# Patient Record
Sex: Female | Born: 1940 | Race: White | Hispanic: No | Smoking: Former smoker
Health system: Southern US, Community
[De-identification: ages and names within clinical notes are randomized; demographics above are authoritative.]

## PROBLEM LIST (undated history)

## (undated) DIAGNOSIS — R350 Frequency of micturition: Secondary | ICD-10-CM

## (undated) DIAGNOSIS — Z9981 Dependence on supplemental oxygen: Secondary | ICD-10-CM

## (undated) DIAGNOSIS — Z9289 Personal history of other medical treatment: Secondary | ICD-10-CM

## (undated) DIAGNOSIS — M2540 Effusion, unspecified joint: Secondary | ICD-10-CM

## (undated) DIAGNOSIS — M62838 Other muscle spasm: Secondary | ICD-10-CM

## (undated) DIAGNOSIS — J439 Emphysema, unspecified: Secondary | ICD-10-CM

## (undated) DIAGNOSIS — I10 Essential (primary) hypertension: Secondary | ICD-10-CM

## (undated) DIAGNOSIS — M549 Dorsalgia, unspecified: Secondary | ICD-10-CM

## (undated) DIAGNOSIS — Z8619 Personal history of other infectious and parasitic diseases: Secondary | ICD-10-CM

## (undated) DIAGNOSIS — Z8669 Personal history of other diseases of the nervous system and sense organs: Secondary | ICD-10-CM

## (undated) DIAGNOSIS — F32A Depression, unspecified: Secondary | ICD-10-CM

## (undated) DIAGNOSIS — N390 Urinary tract infection, site not specified: Secondary | ICD-10-CM

## (undated) DIAGNOSIS — K219 Gastro-esophageal reflux disease without esophagitis: Secondary | ICD-10-CM

## (undated) DIAGNOSIS — G629 Polyneuropathy, unspecified: Secondary | ICD-10-CM

## (undated) DIAGNOSIS — I499 Cardiac arrhythmia, unspecified: Secondary | ICD-10-CM

## (undated) DIAGNOSIS — M1990 Unspecified osteoarthritis, unspecified site: Secondary | ICD-10-CM

## (undated) DIAGNOSIS — M300 Polyarteritis nodosa: Secondary | ICD-10-CM

## (undated) DIAGNOSIS — R51 Headache: Secondary | ICD-10-CM

## (undated) DIAGNOSIS — A6920 Lyme disease, unspecified: Secondary | ICD-10-CM

## (undated) DIAGNOSIS — C541 Malignant neoplasm of endometrium: Secondary | ICD-10-CM

## (undated) DIAGNOSIS — D649 Anemia, unspecified: Secondary | ICD-10-CM

## (undated) DIAGNOSIS — M2550 Pain in unspecified joint: Secondary | ICD-10-CM

## (undated) DIAGNOSIS — R0602 Shortness of breath: Secondary | ICD-10-CM

## (undated) DIAGNOSIS — R413 Other amnesia: Secondary | ICD-10-CM

## (undated) DIAGNOSIS — R3915 Urgency of urination: Secondary | ICD-10-CM

## (undated) DIAGNOSIS — IMO0002 Reserved for concepts with insufficient information to code with codable children: Secondary | ICD-10-CM

## (undated) DIAGNOSIS — Z860100 Personal history of colon polyps, unspecified: Secondary | ICD-10-CM

## (undated) DIAGNOSIS — M810 Age-related osteoporosis without current pathological fracture: Secondary | ICD-10-CM

## (undated) DIAGNOSIS — F329 Major depressive disorder, single episode, unspecified: Secondary | ICD-10-CM

## (undated) DIAGNOSIS — F988 Other specified behavioral and emotional disorders with onset usually occurring in childhood and adolescence: Secondary | ICD-10-CM

## (undated) DIAGNOSIS — F429 Obsessive-compulsive disorder, unspecified: Secondary | ICD-10-CM

## (undated) DIAGNOSIS — K5790 Diverticulosis of intestine, part unspecified, without perforation or abscess without bleeding: Secondary | ICD-10-CM

## (undated) HISTORY — DX: Malignant neoplasm of endometrium: C54.1

## (undated) HISTORY — DX: Obsessive-compulsive disorder, unspecified: F42.9

## (undated) HISTORY — DX: Major depressive disorder, single episode, unspecified: F32.9

## (undated) HISTORY — DX: Depression, unspecified: F32.A

## (undated) HISTORY — PX: TOTAL ABDOMINAL HYSTERECTOMY W/ BILATERAL SALPINGOOPHORECTOMY: SHX83

## (undated) HISTORY — DX: Polyarteritis nodosa: M30.0

## (undated) HISTORY — DX: Unspecified osteoarthritis, unspecified site: M19.90

## (undated) HISTORY — PX: ABDOMINAL HYSTERECTOMY: SHX81

## (undated) HISTORY — DX: Other specified behavioral and emotional disorders with onset usually occurring in childhood and adolescence: F98.8

## (undated) HISTORY — PX: COLONOSCOPY: SHX174

## (undated) HISTORY — PX: CHOLECYSTECTOMY: SHX55

## (undated) HISTORY — PX: WRIST SURGERY: SHX841

## (undated) HISTORY — DX: Essential (primary) hypertension: I10

## (undated) HISTORY — DX: Diverticulosis of intestine, part unspecified, without perforation or abscess without bleeding: K57.90

## (undated) HISTORY — DX: Age-related osteoporosis without current pathological fracture: M81.0

## (undated) HISTORY — PX: OTHER SURGICAL HISTORY: SHX169

## (undated) HISTORY — DX: Lyme disease, unspecified: A69.20

## (undated) HISTORY — DX: Reserved for concepts with insufficient information to code with codable children: IMO0002

---

## 2004-11-08 ENCOUNTER — Ambulatory Visit: Payer: Self-pay | Admitting: Gastroenterology

## 2004-11-17 ENCOUNTER — Ambulatory Visit: Payer: Self-pay | Admitting: Gastroenterology

## 2009-09-03 HISTORY — PX: OTHER SURGICAL HISTORY: SHX169

## 2009-11-10 ENCOUNTER — Ambulatory Visit
Admission: RE | Admit: 2009-11-10 | Discharge: 2009-11-10 | Payer: Self-pay | Source: Home / Self Care | Admitting: Gynecologic Oncology

## 2010-02-02 ENCOUNTER — Ambulatory Visit: Payer: Self-pay | Admitting: Oncology

## 2010-02-06 LAB — CBC WITH DIFFERENTIAL/PLATELET
BASO%: 1.1 % (ref 0.0–2.0)
Eosinophils Absolute: 0.1 10*3/uL (ref 0.0–0.5)
MCHC: 33 g/dL (ref 31.5–36.0)
MONO#: 0.4 10*3/uL (ref 0.1–0.9)
NEUT#: 2.2 10*3/uL (ref 1.5–6.5)
RBC: 4.33 10*6/uL (ref 3.70–5.45)
WBC: 5.3 10*3/uL (ref 3.9–10.3)
lymph#: 2.5 10*3/uL (ref 0.9–3.3)

## 2010-02-06 LAB — COMPREHENSIVE METABOLIC PANEL
ALT: 16 U/L (ref 0–35)
Albumin: 4.3 g/dL (ref 3.5–5.2)
CO2: 24 mEq/L (ref 19–32)
Calcium: 9.3 mg/dL (ref 8.4–10.5)
Chloride: 105 mEq/L (ref 96–112)
Potassium: 4 mEq/L (ref 3.5–5.3)
Sodium: 140 mEq/L (ref 135–145)
Total Bilirubin: 0.4 mg/dL (ref 0.3–1.2)
Total Protein: 6.9 g/dL (ref 6.0–8.3)

## 2010-09-03 DIAGNOSIS — C541 Malignant neoplasm of endometrium: Secondary | ICD-10-CM

## 2010-09-03 HISTORY — DX: Malignant neoplasm of endometrium: C54.1

## 2011-03-02 ENCOUNTER — Encounter: Payer: Self-pay | Admitting: Gastroenterology

## 2011-04-30 ENCOUNTER — Encounter: Payer: Self-pay | Admitting: Gastroenterology

## 2011-04-30 ENCOUNTER — Ambulatory Visit: Payer: Self-pay | Admitting: Gastroenterology

## 2011-09-04 DIAGNOSIS — A6920 Lyme disease, unspecified: Secondary | ICD-10-CM

## 2011-09-04 DIAGNOSIS — Z8619 Personal history of other infectious and parasitic diseases: Secondary | ICD-10-CM

## 2011-09-04 HISTORY — DX: Lyme disease, unspecified: A69.20

## 2011-09-04 HISTORY — DX: Personal history of other infectious and parasitic diseases: Z86.19

## 2011-10-01 ENCOUNTER — Encounter: Payer: Self-pay | Admitting: Gastroenterology

## 2013-07-02 ENCOUNTER — Encounter: Payer: Self-pay | Admitting: Gastroenterology

## 2013-07-15 ENCOUNTER — Ambulatory Visit (INDEPENDENT_AMBULATORY_CARE_PROVIDER_SITE_OTHER): Payer: Medicare Other | Admitting: Gastroenterology

## 2013-07-15 ENCOUNTER — Encounter: Payer: Self-pay | Admitting: Gastroenterology

## 2013-07-15 VITALS — BP 110/50 | HR 92 | Ht 63.0 in | Wt 146.1 lb

## 2013-07-15 DIAGNOSIS — C549 Malignant neoplasm of corpus uteri, unspecified: Secondary | ICD-10-CM

## 2013-07-15 DIAGNOSIS — D509 Iron deficiency anemia, unspecified: Secondary | ICD-10-CM

## 2013-07-15 DIAGNOSIS — C541 Malignant neoplasm of endometrium: Secondary | ICD-10-CM | POA: Insufficient documentation

## 2013-07-15 DIAGNOSIS — D649 Anemia, unspecified: Secondary | ICD-10-CM

## 2013-07-15 HISTORY — DX: Iron deficiency anemia, unspecified: D50.9

## 2013-07-15 NOTE — Patient Instructions (Signed)
You have been scheduled for a colonoscopy with propofol. Please follow written instructions given to you at your visit today.  Please pick up your prep kit at the pharmacy within the next 1-3 days. If you use inhalers (even only as needed), please bring them with you on the day of your procedure. Your physician has requested that you go to www.startemmi.com and enter the access code given to you at your visit today. This web site gives a general overview about your procedure. However, you should still follow specific instructions given to you by our office regarding your preparation for the procedure. Go to the basement for your IFOB Kit You will need to contact [facility]

## 2013-07-15 NOTE — Assessment & Plan Note (Signed)
Iron deficiency anemia is most likely due to chronic GI blood loss.  Patient had been taking NSAIDs and steroids for several weeks which may have caused ulcerations or erosions anywhere along the GI tract.  Bleeding from polyps, AVMs, neoplasm and ulcer disease are considerations.  Recommendations #1 colonoscopy #2 Hemoccults #3 upper endoscopy if colonoscopy is not diagnostic for a GI bleeding source  Risks, alternatives, and complications of the procedure, including bleeding, perforation, and possible need for surgery, were explained to the patient.  Patient's questions were answered.

## 2013-07-15 NOTE — Progress Notes (Signed)
History of Present Illness: 72 year old white female with history of stage IIIc endometrial CA, referred for evaluation of an iron deficiency anemia.  This was noted on routine testing.  Recent hemoglobin was 8.6 with MCV 77.  Serum iron was 21 and percent saturation 5%.  Patient has no GI complaints including change of bowel habits, abdominal pain, melena or hematochezia.  She had been taking Celebrex and dexamethasone for months because of arthritic hip pain.  She stopped these medicines several weeks ago.   2006 colonoscopy demonstrated diverticulosis.  She denies pyrosis, dysphagia,  For her endometrial CA, diagnosed in May, 2011, she received chemotherapy following a total abdominal hysterectomy and bilateral salpingo-oophorectomy.    Past Medical History  Diagnosis Date  . Emphysema   . DDD (degenerative disc disease)   . Unspecified essential hypertension   . Rib fracture     from coughing  . Chronic bronchitis   . ADD (attention deficit disorder)   . Depression   . OCD (obsessive compulsive disorder)   . Endometrial adenocarcinoma 2012  . Lyme disease 2013  . Polyarteritis nodosa   . DJD (degenerative joint disease)   . Osteoporosis    Past Surgical History  Procedure Laterality Date  . Cholecystectomy    . Tubal ligation    . Total abdominal hysterectomy w/ bilateral salpingoophorectomy     family history includes Cervical cancer in her maternal aunt; Diabetes in her father and maternal grandmother; Emphysema in her mother; Heart disease in her maternal grandmother and mother; Lung disease in her brother. Current Outpatient Prescriptions  Medication Sig Dispense Refill  . albuterol (PROVENTIL HFA;VENTOLIN HFA) 108 (90 BASE) MCG/ACT inhaler Inhale 2 puffs into the lungs as needed for wheezing or shortness of breath.      . celecoxib (CELEBREX) 200 MG capsule Take 200 mg by mouth 2 (two) times daily.      . cloNIDine (CATAPRES) 0.1 MG tablet Take 0.1 mg by mouth daily.       . cyclobenzaprine (FLEXERIL) 10 MG tablet Take 10 mg by mouth at bedtime as needed for muscle spasms.      Marland Kitchen FLUoxetine (PROZAC) 40 MG capsule Take 40 mg by mouth 2 (two) times daily.      . fluticasone-salmeterol (ADVAIR HFA) 115-21 MCG/ACT inhaler Inhale 2 puffs into the lungs 2 (two) times daily.        . methylphenidate (RITALIN) 10 MG tablet Take 10 mg by mouth 2 (two) times daily.      . metoprolol tartrate (LOPRESSOR) 25 MG tablet Take 25 mg by mouth 1 day or 1 dose.        Marland Kitchen omeprazole (PRILOSEC) 20 MG capsule Take 20 mg by mouth daily.      Marland Kitchen oxycodone-acetaminophen (PERCOCET) 2.5-325 MG per tablet Take 1-2 tablets by mouth daily.      . ramipril (ALTACE) 5 MG capsule Take 5 mg by mouth daily.        Marland Kitchen VITAMIN D, CHOLECALCIFEROL, PO Take 1.25 mg by mouth once a week.        No current facility-administered medications for this visit.   Allergies as of 07/15/2013  . (No Known Allergies)    reports that she quit smoking about 7 years ago. Her smoking use included Cigarettes. She smoked 0.00 packs per day. She has never used smokeless tobacco. She reports that she does not drink alcohol or use illicit drugs.     Review of Systems: She has severe hip pain rendering her  unable to walk Pertinent positive and negative review of systems were noted in the above HPI section. All other review of systems were otherwise negative.  Vital signs were reviewed in today's medical record Physical Exam: General: Well developed , well nourished, no acute distress Skin: anicteric Head: Normocephalic and atraumatic Eyes:  sclerae anicteric, EOMI Ears: Normal auditory acuity Mouth: No deformity or lesions Neck: Supple, no masses or thyromegaly Lungs: Clear throughout to auscultation Heart: Regular rate and rhythm; no murmurs, rubs or bruits Abdomen: Soft, non tender and non distended. No masses, hepatosplenomegaly or hernias noted. Normal Bowel sounds Rectal:deferred Musculoskeletal:  Symmetrical with no gross deformities  Skin: No lesions on visible extremities Pulses:  Normal pulses noted Extremities: No clubbing, cyanosis, edema or deformities noted Neurological: Alert oriented x 4, grossly nonfocal Cervical Nodes:  No significant cervical adenopathy Inguinal Nodes: No significant inguinal adenopathy Psychological:  Alert and cooperative. Normal mood and affect

## 2013-07-28 ENCOUNTER — Other Ambulatory Visit (HOSPITAL_COMMUNITY): Payer: Self-pay | Admitting: Orthopaedic Surgery

## 2013-08-13 ENCOUNTER — Encounter (HOSPITAL_COMMUNITY)
Admission: RE | Admit: 2013-08-13 | Discharge: 2013-08-13 | Disposition: A | Discharge status: Home / Self Care | Payer: Medicare Other | Source: Ambulatory Visit | Attending: Orthopaedic Surgery | Admitting: Orthopaedic Surgery

## 2013-08-13 ENCOUNTER — Encounter (HOSPITAL_COMMUNITY): Payer: Self-pay

## 2013-08-13 DIAGNOSIS — Z01812 Encounter for preprocedural laboratory examination: Secondary | ICD-10-CM | POA: Insufficient documentation

## 2013-08-13 DIAGNOSIS — Z01818 Encounter for other preprocedural examination: Secondary | ICD-10-CM | POA: Insufficient documentation

## 2013-08-13 HISTORY — DX: Personal history of colonic polyps: Z86.010

## 2013-08-13 HISTORY — DX: Urinary tract infection, site not specified: N39.0

## 2013-08-13 HISTORY — DX: Effusion, unspecified joint: M25.40

## 2013-08-13 HISTORY — DX: Emphysema, unspecified: J43.9

## 2013-08-13 HISTORY — DX: Anemia, unspecified: D64.9

## 2013-08-13 HISTORY — DX: Polyneuropathy, unspecified: G62.9

## 2013-08-13 HISTORY — DX: Cardiac arrhythmia, unspecified: I49.9

## 2013-08-13 HISTORY — DX: Gastro-esophageal reflux disease without esophagitis: K21.9

## 2013-08-13 HISTORY — DX: Frequency of micturition: R35.0

## 2013-08-13 HISTORY — DX: Personal history of other infectious and parasitic diseases: Z86.19

## 2013-08-13 HISTORY — DX: Shortness of breath: R06.02

## 2013-08-13 HISTORY — DX: Dorsalgia, unspecified: M54.9

## 2013-08-13 HISTORY — DX: Urgency of urination: R39.15

## 2013-08-13 HISTORY — DX: Personal history of other diseases of the nervous system and sense organs: Z86.69

## 2013-08-13 HISTORY — DX: Other amnesia: R41.3

## 2013-08-13 HISTORY — DX: Pain in unspecified joint: M25.50

## 2013-08-13 HISTORY — DX: Personal history of colon polyps, unspecified: Z86.0100

## 2013-08-13 HISTORY — DX: Other muscle spasm: M62.838

## 2013-08-13 LAB — BASIC METABOLIC PANEL
BUN: 22 mg/dL (ref 6–23)
CO2: 23 mEq/L (ref 19–32)
Calcium: 9.5 mg/dL (ref 8.4–10.5)
Creatinine, Ser: 0.7 mg/dL (ref 0.50–1.10)
GFR calc non Af Amer: 85 mL/min — ABNORMAL LOW (ref >90)
Glucose, Bld: 95 mg/dL (ref 70–99)

## 2013-08-13 LAB — ABO/RH: ABO/RH(D): A POS

## 2013-08-13 LAB — CBC
Hemoglobin: 10.7 g/dL — ABNORMAL LOW (ref 12.0–15.0)
MCH: 24.1 pg — ABNORMAL LOW (ref 26.0–34.0)
MCHC: 31.3 g/dL (ref 30.0–36.0)
MCV: 77 fL — ABNORMAL LOW (ref 78.0–100.0)
Platelets: 312 10*3/uL (ref 150–400)

## 2013-08-13 LAB — PROTIME-INR: Prothrombin Time: 13 seconds (ref 11.6–15.2)

## 2013-08-13 NOTE — Progress Notes (Signed)
Cardiologist is with [facility] systems with last visit about 4-29months ago;just has to go back as needed was seen for clearance  Denies ever having a stress test/heart cath  Echo done around Spring 2013  Medical Md is Dr.Phillip Land in [place]  EKG to be requested from [employer]

## 2013-08-13 NOTE — Progress Notes (Signed)
Pt left prior to giving urine sample to lab technician.Now will have to obtain ua on arrival

## 2013-08-13 NOTE — Pre-Procedure Instructions (Signed)
Jerika Wales  08/13/2013   Your procedure is scheduled on:  Tues, Dec 16 @ 12:45 PM  Report to [facility]trance A at 10:45 AM.  Call this number if you have problems the morning of surgery: [phone]   Remember:   Do not eat food or drink liquids after midnight.   Take these medicines the morning of surgery with A SIP OF WATER: Albuterol<Bring Your Inhaler With You>,Clonidine(Catapres),Prozac(Fluoxetine),Advair(Fluticasone),Ritalin(Methylphenidate),Metoprolol(Lopressor),Omeprazole(Prilosec),and Pain Pill(if needed)               No Goody's,BC's,Aleve,Aspirin,Ibuprofen,Fish Oil,or any Herbal Medications   Do not wear jewelry, make-up or nail polish.  Do not wear lotions, powders, or perfumes. You may wear deodorant.  Do not shave 48 hours prior to surgery.   Do not bring valuables to the hospital.  [facility] is not responsible                  for any belongings or valuables.               Contacts, dentures or bridgework may not be worn into surgery.  Leave suitcase in the car. After surgery it may be brought to your room.  For patients admitted to the hospital, discharge time is determined by your                treatment team.                 Special Instructions: Shower using CHG 2 nights before surgery and the night before surgery.  If you shower the day of surgery use CHG.  Use special wash - you have one bottle of CHG for all showers.  You should use approximately 1/3 of the bottle for each shower.   Please read over the following fact sheets that you were given: Pain Booklet, Coughing and Deep Breathing, Blood Transfusion Information, MRSA Information and Surgical Site Infection Prevention

## 2013-08-14 NOTE — Progress Notes (Signed)
Anesthesia chart review:  Patient is a 72 year old female scheduled for left THA on 08/18/13 by Dr. Doneen Poisson.    History includes former smoker, Lyme disease '13, polyarteritis nodosa, endometrial adenocarcinoma s/p hysterectomy/BSO, OCD, ADD, GERD, migraines, emphysema, chronic bronchitis, HTN, neuropathy, short-term memory loss, anemia, cholecystectomy, right BBB, tachycardia (atiral tachycardia per cardiology notes). PCP is Dr. Treasa School in [place]. She was seen by cardiologist Dr. Norman Herrlich of [facility]) in [place] in April 2014 for preoperative evaluation for a right hand surgery.  It appears that ultimately, PRN cardiology follow-up was recommended. She is on b-blocker therapy.  Echo on 04/16/12 [facility]) showed normal LV size and global and regional systolic function, EF 60%. Abnormal diastolic function, impaired relaxation with a normal LV EDP. Mild aortic valve sclerosis, mild AR.  No AS.  Trivial MR. Moderate TR. RVSP 40 mm Hg.  By notes, EKG on 12/16/12 showed NSR, with right BBB. ([facility] reports that EKG was not done at their office, so they can not send it. Fax request send to Dr. Thomos Lemons office.  Short Stay staff to follow-up.  If not received she will need an EKG on arrival.) HR was 86 at PAT.  CT of the Chest/ABD/PELVIS with contrast on 06/29/13 [facility]) showed two 2-3 mm subpleural nodules in the left lower lobe unchanged from 06/22/2010, benign. Calcified granuloma in the right lower lobe. Prior left upper lobe pulmonary nodule has resolved. Underlying moderate centrilobular emphysematous changes. Mild dependent atelectasis of the right lower lobe. No pleural effusion or pneumothorax. Visualized thyroid is unremarkable. The heart is normal size. No pericardial effusion. Coronary atherosclerosis. Atherosclerotic calcification of aortic arch and abdominal aorta and branch vessels. No suspicious mediastinal, hilar, or axillary lymphadenopathy.  Dense glanular tissue with scattered calcification in the bilateral breasts. Moderate hiatal hernia. Mild degenerative changes of the thoracic spine. Liver, spleen, pancreas, and adrenal glands are within normal limits. Status post cholecystectomy and hysterectomy. No evidence of recurrent or metastatic disease in the chest, abdomen, or pelvis.  (Since she had a recent chest CT, I do not think that she will require a preoperative chest CXR.)  Preoperative labs noted.  Patient left her PAT visit without providing a urine specimen, so this will need to be done on the day of surgery.  If no acute changes then I would anticipate that she could proceed as planned.  Velna Ochs [facility] Stay [facility]/Anesthesiology Phone [phone] 08/14/2013 12:00 PM

## 2013-08-17 MED ORDER — CEFAZOLIN SODIUM-DEXTROSE 2-3 GM-% IV SOLR
2.0000 g | INTRAVENOUS | Status: AC
  Administered 2013-08-18: 2 g via INTRAVENOUS
  Filled 2013-08-17: qty 50

## 2013-08-18 ENCOUNTER — Encounter (HOSPITAL_COMMUNITY)
Admission: RE | Disposition: A | Discharge status: Home Health | Payer: Self-pay | Source: Ambulatory Visit | Attending: Orthopaedic Surgery

## 2013-08-18 ENCOUNTER — Inpatient Hospital Stay (HOSPITAL_COMMUNITY)
Admission: RE | Admit: 2013-08-18 | Discharge: 2013-08-21 | DRG: 470 | Disposition: A | Discharge status: Home Health | Payer: Medicare Other | Source: Ambulatory Visit | Attending: Orthopaedic Surgery | Admitting: Orthopaedic Surgery

## 2013-08-18 ENCOUNTER — Encounter (HOSPITAL_COMMUNITY): Payer: Medicare Other | Admitting: Vascular Surgery

## 2013-08-18 ENCOUNTER — Inpatient Hospital Stay (HOSPITAL_COMMUNITY): Payer: Medicare Other | Admitting: Certified Registered Nurse Anesthetist

## 2013-08-18 ENCOUNTER — Inpatient Hospital Stay (HOSPITAL_COMMUNITY): Payer: Medicare Other

## 2013-08-18 ENCOUNTER — Encounter (HOSPITAL_COMMUNITY): Payer: Self-pay | Admitting: *Deleted

## 2013-08-18 DIAGNOSIS — J4489 Other specified chronic obstructive pulmonary disease: Secondary | ICD-10-CM | POA: Diagnosis present

## 2013-08-18 DIAGNOSIS — B370 Candidal stomatitis: Secondary | ICD-10-CM | POA: Diagnosis present

## 2013-08-18 DIAGNOSIS — F429 Obsessive-compulsive disorder, unspecified: Secondary | ICD-10-CM | POA: Diagnosis present

## 2013-08-18 DIAGNOSIS — Z8049 Family history of malignant neoplasm of other genital organs: Secondary | ICD-10-CM

## 2013-08-18 DIAGNOSIS — J449 Chronic obstructive pulmonary disease, unspecified: Secondary | ICD-10-CM | POA: Diagnosis present

## 2013-08-18 DIAGNOSIS — Z8542 Personal history of malignant neoplasm of other parts of uterus: Secondary | ICD-10-CM

## 2013-08-18 DIAGNOSIS — D509 Iron deficiency anemia, unspecified: Secondary | ICD-10-CM | POA: Diagnosis present

## 2013-08-18 DIAGNOSIS — M25459 Effusion, unspecified hip: Secondary | ICD-10-CM | POA: Diagnosis present

## 2013-08-18 DIAGNOSIS — Z87891 Personal history of nicotine dependence: Secondary | ICD-10-CM

## 2013-08-18 DIAGNOSIS — K219 Gastro-esophageal reflux disease without esophagitis: Secondary | ICD-10-CM | POA: Diagnosis present

## 2013-08-18 DIAGNOSIS — G43909 Migraine, unspecified, not intractable, without status migrainosus: Secondary | ICD-10-CM | POA: Diagnosis present

## 2013-08-18 DIAGNOSIS — Z96649 Presence of unspecified artificial hip joint: Secondary | ICD-10-CM

## 2013-08-18 DIAGNOSIS — I4510 Unspecified right bundle-branch block: Secondary | ICD-10-CM | POA: Diagnosis present

## 2013-08-18 DIAGNOSIS — R413 Other amnesia: Secondary | ICD-10-CM | POA: Diagnosis present

## 2013-08-18 DIAGNOSIS — Z8249 Family history of ischemic heart disease and other diseases of the circulatory system: Secondary | ICD-10-CM

## 2013-08-18 DIAGNOSIS — G589 Mononeuropathy, unspecified: Secondary | ICD-10-CM | POA: Diagnosis present

## 2013-08-18 DIAGNOSIS — I10 Essential (primary) hypertension: Secondary | ICD-10-CM | POA: Diagnosis present

## 2013-08-18 DIAGNOSIS — F329 Major depressive disorder, single episode, unspecified: Secondary | ICD-10-CM | POA: Diagnosis present

## 2013-08-18 DIAGNOSIS — D62 Acute posthemorrhagic anemia: Secondary | ICD-10-CM | POA: Diagnosis not present

## 2013-08-18 DIAGNOSIS — M62838 Other muscle spasm: Secondary | ICD-10-CM | POA: Diagnosis present

## 2013-08-18 DIAGNOSIS — M169 Osteoarthritis of hip, unspecified: Secondary | ICD-10-CM

## 2013-08-18 DIAGNOSIS — F3289 Other specified depressive episodes: Secondary | ICD-10-CM | POA: Diagnosis present

## 2013-08-18 DIAGNOSIS — Z8619 Personal history of other infectious and parasitic diseases: Secondary | ICD-10-CM

## 2013-08-18 DIAGNOSIS — F1020 Alcohol dependence, uncomplicated: Secondary | ICD-10-CM | POA: Diagnosis present

## 2013-08-18 DIAGNOSIS — M810 Age-related osteoporosis without current pathological fracture: Secondary | ICD-10-CM | POA: Diagnosis present

## 2013-08-18 DIAGNOSIS — Z833 Family history of diabetes mellitus: Secondary | ICD-10-CM

## 2013-08-18 DIAGNOSIS — M1610 Unilateral primary osteoarthritis, unspecified hip: Principal | ICD-10-CM | POA: Diagnosis present

## 2013-08-18 DIAGNOSIS — F988 Other specified behavioral and emotional disorders with onset usually occurring in childhood and adolescence: Secondary | ICD-10-CM | POA: Diagnosis present

## 2013-08-18 HISTORY — PX: TOTAL HIP ARTHROPLASTY: SHX124

## 2013-08-18 HISTORY — DX: Presence of unspecified artificial hip joint: Z96.649

## 2013-08-18 LAB — URINALYSIS, ROUTINE W REFLEX MICROSCOPIC
Hgb urine dipstick: NEGATIVE
Leukocytes, UA: NEGATIVE
Nitrite: NEGATIVE
Specific Gravity, Urine: 1.021 (ref 1.005–1.030)
pH: 8 (ref 5.0–8.0)

## 2013-08-18 SURGERY — ARTHROPLASTY, HIP, TOTAL, ANTERIOR APPROACH
Anesthesia: General | Site: Hip | Laterality: Left

## 2013-08-18 MED ORDER — ALBUTEROL SULFATE HFA 108 (90 BASE) MCG/ACT IN AERS
2.0000 | INHALATION_SPRAY | RESPIRATORY_TRACT | Status: DC | PRN
  Filled 2013-08-18: qty 6.7

## 2013-08-18 MED ORDER — METOCLOPRAMIDE HCL 10 MG PO TABS: 5.0000 mg | ORAL_TABLET | Freq: Three times a day (TID) | ORAL | Status: DC | PRN

## 2013-08-18 MED ORDER — METHOCARBAMOL 500 MG PO TABS: 500.0000 mg | ORAL_TABLET | Freq: Four times a day (QID) | ORAL | Status: DC | PRN

## 2013-08-18 MED ORDER — ZOLPIDEM TARTRATE 5 MG PO TABS: 5.0000 mg | ORAL_TABLET | Freq: Every evening | ORAL | Status: DC | PRN

## 2013-08-18 MED ORDER — METOCLOPRAMIDE HCL 5 MG/ML IJ SOLN: 5.0000 mg | Freq: Three times a day (TID) | INTRAMUSCULAR | Status: DC | PRN

## 2013-08-18 MED ORDER — LIDOCAINE HCL (CARDIAC) 20 MG/ML IV SOLN
INTRAVENOUS | Status: DC | PRN
  Administered 2013-08-18: 80 mg via INTRAVENOUS
  Administered 2013-08-18: 10 mg via INTRAVENOUS

## 2013-08-18 MED ORDER — ALUM & MAG HYDROXIDE-SIMETH 200-200-20 MG/5ML PO SUSP: 30.0000 mL | ORAL | Status: DC | PRN

## 2013-08-18 MED ORDER — SODIUM CHLORIDE 0.9 % IR SOLN
Status: DC | PRN
  Administered 2013-08-18: 3000 mL

## 2013-08-18 MED ORDER — PHENYLEPHRINE HCL 10 MG/ML IJ SOLN
INTRAMUSCULAR | Status: DC | PRN
  Administered 2013-08-18 (×2): 40 ug via INTRAVENOUS
  Administered 2013-08-18: 80 ug via INTRAVENOUS
  Administered 2013-08-18: 40 ug via INTRAVENOUS
  Administered 2013-08-18 (×4): 80 ug via INTRAVENOUS

## 2013-08-18 MED ORDER — NEOSTIGMINE METHYLSULFATE 1 MG/ML IJ SOLN
INTRAMUSCULAR | Status: DC | PRN
  Administered 2013-08-18: 4 mg via INTRAVENOUS

## 2013-08-18 MED ORDER — MIDAZOLAM HCL 5 MG/5ML IJ SOLN
INTRAMUSCULAR | Status: DC | PRN
  Administered 2013-08-18: 1 mg via INTRAVENOUS

## 2013-08-18 MED ORDER — METOPROLOL TARTRATE 25 MG PO TABS
25.0000 mg | ORAL_TABLET | ORAL | Status: DC
  Administered 2013-08-19 – 2013-08-21 (×3): 25 mg via ORAL
  Filled 2013-08-18 (×3): qty 1

## 2013-08-18 MED ORDER — ACETAMINOPHEN 325 MG PO TABS: 650.0000 mg | ORAL_TABLET | Freq: Four times a day (QID) | ORAL | Status: DC | PRN

## 2013-08-18 MED ORDER — HYDROMORPHONE HCL PF 1 MG/ML IJ SOLN
1.0000 mg | INTRAMUSCULAR | Status: DC | PRN
  Administered 2013-08-18 – 2013-08-21 (×6): 1 mg via INTRAVENOUS
  Filled 2013-08-18 (×6): qty 1

## 2013-08-18 MED ORDER — ONDANSETRON HCL 4 MG/2ML IJ SOLN
INTRAMUSCULAR | Status: DC | PRN
  Administered 2013-08-18: 4 mg via INTRAVENOUS

## 2013-08-18 MED ORDER — TRANEXAMIC ACID 100 MG/ML IV SOLN
1000.0000 mg | INTRAVENOUS | Status: AC
  Administered 2013-08-18: 1000 mg via INTRAVENOUS
  Filled 2013-08-18: qty 10

## 2013-08-18 MED ORDER — FENTANYL CITRATE 0.05 MG/ML IJ SOLN
25.0000 ug | INTRAMUSCULAR | Status: DC | PRN
  Administered 2013-08-18 (×3): 50 ug via INTRAVENOUS

## 2013-08-18 MED ORDER — METHYLPHENIDATE HCL 5 MG PO TABS
10.0000 mg | ORAL_TABLET | Freq: Two times a day (BID) | ORAL | Status: DC
  Administered 2013-08-19 – 2013-08-21 (×3): 10 mg via ORAL
  Filled 2013-08-18 (×4): qty 2

## 2013-08-18 MED ORDER — FLUOXETINE HCL 20 MG PO CAPS
40.0000 mg | ORAL_CAPSULE | Freq: Two times a day (BID) | ORAL | Status: DC
  Administered 2013-08-19 – 2013-08-21 (×4): 40 mg via ORAL
  Filled 2013-08-18 (×7): qty 2

## 2013-08-18 MED ORDER — POLYETHYLENE GLYCOL 3350 17 G PO PACK: 17.0000 g | PACK | Freq: Every day | ORAL | Status: DC | PRN

## 2013-08-18 MED ORDER — DIPHENHYDRAMINE HCL 12.5 MG/5ML PO ELIX: 12.5000 mg | ORAL_SOLUTION | ORAL | Status: DC | PRN

## 2013-08-18 MED ORDER — GABAPENTIN 300 MG PO CAPS
300.0000 mg | ORAL_CAPSULE | Freq: Every day | ORAL | Status: DC
  Administered 2013-08-18 – 2013-08-20 (×3): 300 mg via ORAL
  Filled 2013-08-18 (×4): qty 1

## 2013-08-18 MED ORDER — OXYCODONE HCL ER 20 MG PO T12A
20.0000 mg | EXTENDED_RELEASE_TABLET | Freq: Two times a day (BID) | ORAL | Status: DC
  Administered 2013-08-20 – 2013-08-21 (×3): 20 mg via ORAL
  Filled 2013-08-18 (×5): qty 2

## 2013-08-18 MED ORDER — ALBUTEROL SULFATE HFA 108 (90 BASE) MCG/ACT IN AERS
INHALATION_SPRAY | RESPIRATORY_TRACT | Status: DC | PRN
  Administered 2013-08-18: 4 via RESPIRATORY_TRACT

## 2013-08-18 MED ORDER — ONDANSETRON HCL 4 MG PO TABS: 4.0000 mg | ORAL_TABLET | Freq: Four times a day (QID) | ORAL | Status: DC | PRN

## 2013-08-18 MED ORDER — PHENOL 1.4 % MT LIQD: 1.0000 | OROMUCOSAL | Status: DC | PRN

## 2013-08-18 MED ORDER — PANTOPRAZOLE SODIUM 40 MG PO TBEC
40.0000 mg | DELAYED_RELEASE_TABLET | Freq: Every day | ORAL | Status: DC
  Administered 2013-08-19 – 2013-08-21 (×3): 40 mg via ORAL
  Filled 2013-08-18 (×3): qty 1

## 2013-08-18 MED ORDER — ROCURONIUM BROMIDE 100 MG/10ML IV SOLN
INTRAVENOUS | Status: DC | PRN
  Administered 2013-08-18: 10 mg via INTRAVENOUS
  Administered 2013-08-18: 40 mg via INTRAVENOUS

## 2013-08-18 MED ORDER — DOCUSATE SODIUM 100 MG PO CAPS
100.0000 mg | ORAL_CAPSULE | Freq: Two times a day (BID) | ORAL | Status: DC
  Administered 2013-08-18 – 2013-08-21 (×6): 100 mg via ORAL
  Filled 2013-08-18 (×7): qty 1

## 2013-08-18 MED ORDER — CLONIDINE HCL 0.1 MG PO TABS
0.1000 mg | ORAL_TABLET | Freq: Every day | ORAL | Status: DC
  Administered 2013-08-19 – 2013-08-21 (×2): 0.1 mg via ORAL
  Filled 2013-08-18 (×4): qty 1

## 2013-08-18 MED ORDER — GLYCOPYRROLATE 0.2 MG/ML IJ SOLN
INTRAMUSCULAR | Status: DC | PRN
  Administered 2013-08-18: 0.6 mg via INTRAVENOUS

## 2013-08-18 MED ORDER — MOMETASONE FURO-FORMOTEROL FUM 100-5 MCG/ACT IN AERO
2.0000 | INHALATION_SPRAY | Freq: Two times a day (BID) | RESPIRATORY_TRACT | Status: DC
  Administered 2013-08-19: 2 via RESPIRATORY_TRACT
  Filled 2013-08-18: qty 8.8

## 2013-08-18 MED ORDER — FLUOXETINE HCL 40 MG PO CAPS: 40.0000 mg | ORAL_CAPSULE | Freq: Two times a day (BID) | ORAL | Status: DC

## 2013-08-18 MED ORDER — CEFAZOLIN SODIUM 1-5 GM-% IV SOLN
1.0000 g | Freq: Four times a day (QID) | INTRAVENOUS | Status: AC
  Administered 2013-08-18 – 2013-08-19 (×2): 1 g via INTRAVENOUS
  Filled 2013-08-18 (×2): qty 50

## 2013-08-18 MED ORDER — DEXTROSE 5 % IV SOLN
500.0000 mg | Freq: Four times a day (QID) | INTRAVENOUS | Status: DC | PRN
  Filled 2013-08-18: qty 5

## 2013-08-18 MED ORDER — 0.9 % SODIUM CHLORIDE (POUR BTL) OPTIME
TOPICAL | Status: DC | PRN
  Administered 2013-08-18: 1000 mL

## 2013-08-18 MED ORDER — PROPOFOL 10 MG/ML IV BOLUS
INTRAVENOUS | Status: DC | PRN
  Administered 2013-08-18: 180 mg via INTRAVENOUS

## 2013-08-18 MED ORDER — FENTANYL CITRATE 0.05 MG/ML IJ SOLN
INTRAMUSCULAR | Status: AC
  Administered 2013-08-18: 50 ug via INTRAVENOUS
  Filled 2013-08-18: qty 2

## 2013-08-18 MED ORDER — ASPIRIN EC 325 MG PO TBEC
325.0000 mg | DELAYED_RELEASE_TABLET | Freq: Two times a day (BID) | ORAL | Status: DC
  Administered 2013-08-18 – 2013-08-21 (×6): 325 mg via ORAL
  Filled 2013-08-18 (×8): qty 1

## 2013-08-18 MED ORDER — FENTANYL CITRATE 0.05 MG/ML IJ SOLN
INTRAMUSCULAR | Status: DC | PRN
  Administered 2013-08-18 (×4): 50 ug via INTRAVENOUS
  Administered 2013-08-18: 150 ug via INTRAVENOUS

## 2013-08-18 MED ORDER — LACTATED RINGERS IV SOLN
INTRAVENOUS | Status: DC
  Administered 2013-08-18: 11:00:00 via INTRAVENOUS

## 2013-08-18 MED ORDER — SODIUM CHLORIDE 0.9 % IV SOLN
INTRAVENOUS | Status: DC
  Administered 2013-08-18: 17:00:00 via INTRAVENOUS
  Administered 2013-08-19: 1000 mL via INTRAVENOUS

## 2013-08-18 MED ORDER — MENTHOL 3 MG MT LOZG: 1.0000 | LOZENGE | OROMUCOSAL | Status: DC | PRN

## 2013-08-18 MED ORDER — OXYCODONE HCL 5 MG PO TABS
5.0000 mg | ORAL_TABLET | ORAL | Status: DC | PRN
  Filled 2013-08-18: qty 2

## 2013-08-18 MED ORDER — RAMIPRIL 5 MG PO CAPS
5.0000 mg | ORAL_CAPSULE | Freq: Every day | ORAL | Status: DC
  Administered 2013-08-19 – 2013-08-21 (×2): 5 mg via ORAL
  Filled 2013-08-18 (×3): qty 1

## 2013-08-18 MED ORDER — DEXAMETHASONE SODIUM PHOSPHATE 4 MG/ML IJ SOLN
INTRAMUSCULAR | Status: DC | PRN
  Administered 2013-08-18: 8 mg via INTRAVENOUS

## 2013-08-18 MED ORDER — ONDANSETRON HCL 4 MG/2ML IJ SOLN
4.0000 mg | Freq: Four times a day (QID) | INTRAMUSCULAR | Status: DC | PRN
  Administered 2013-08-19: 4 mg via INTRAVENOUS
  Filled 2013-08-18: qty 2

## 2013-08-18 MED ORDER — LACTATED RINGERS IV SOLN
INTRAVENOUS | Status: DC | PRN
  Administered 2013-08-18 (×2): via INTRAVENOUS

## 2013-08-18 MED ORDER — ACETAMINOPHEN 650 MG RE SUPP: 650.0000 mg | Freq: Four times a day (QID) | RECTAL | Status: DC | PRN

## 2013-08-18 SURGICAL SUPPLY — 56 items
BANDAGE GAUZE ELAST BULKY 4 IN (GAUZE/BANDAGES/DRESSINGS) IMPLANT
BENZOIN TINCTURE PRP APPL 2/3 (GAUZE/BANDAGES/DRESSINGS) ×2 IMPLANT
BLADE SAW SGTL 18X1.27X75 (BLADE) ×2 IMPLANT
BLADE SURG ROTATE 9660 (MISCELLANEOUS) IMPLANT
CAPT HIP PF COP ×2 IMPLANT
CELLS DAT CNTRL 66122 CELL SVR (MISCELLANEOUS) ×1 IMPLANT
CLOTH BEACON ORANGE TIMEOUT ST (SAFETY) ×2 IMPLANT
CLSR STERI-STRIP ANTIMIC 1/2X4 (GAUZE/BANDAGES/DRESSINGS) ×2 IMPLANT
COVER BACK TABLE 24X17X13 BIG (DRAPES) IMPLANT
COVER SURGICAL LIGHT HANDLE (MISCELLANEOUS) ×2 IMPLANT
DERMABOND ADHESIVE PROPEN (GAUZE/BANDAGES/DRESSINGS)
DERMABOND ADVANCED .7 DNX6 (GAUZE/BANDAGES/DRESSINGS) IMPLANT
DRAPE C-ARM 42X72 X-RAY (DRAPES) ×2 IMPLANT
DRAPE STERI IOBAN 125X83 (DRAPES) ×2 IMPLANT
DRAPE U-SHAPE 47X51 STRL (DRAPES) ×6 IMPLANT
DRSG AQUACEL AG ADV 3.5X10 (GAUZE/BANDAGES/DRESSINGS) ×2 IMPLANT
DURAPREP 26ML APPLICATOR (WOUND CARE) ×2 IMPLANT
ELECT BLADE 4.0 EZ CLEAN MEGAD (MISCELLANEOUS) ×2
ELECT BLADE 6.5 EXT (BLADE) IMPLANT
ELECT BLADE TIP CTD 4 INCH (ELECTRODE) ×2 IMPLANT
ELECT CAUTERY BLADE 6.4 (BLADE) ×2 IMPLANT
ELECT REM PT RETURN 9FT ADLT (ELECTROSURGICAL) ×2
ELECTRODE BLDE 4.0 EZ CLN MEGD (MISCELLANEOUS) ×1 IMPLANT
ELECTRODE REM PT RTRN 9FT ADLT (ELECTROSURGICAL) ×1 IMPLANT
FACESHIELD LNG OPTICON STERILE (SAFETY) ×6 IMPLANT
GAUZE XEROFORM 1X8 LF (GAUZE/BANDAGES/DRESSINGS) IMPLANT
GLOVE BIOGEL PI IND STRL 8 (GLOVE) ×2 IMPLANT
GLOVE BIOGEL PI INDICATOR 8 (GLOVE) ×2
GLOVE ECLIPSE 8.0 STRL XLNG CF (GLOVE) ×2 IMPLANT
GLOVE ORTHO TXT STRL SZ7.5 (GLOVE) ×4 IMPLANT
GLOVE SURG ORTHO 8.0 STRL STRW (GLOVE) ×2 IMPLANT
GOWN STRL REIN XL XLG (GOWN DISPOSABLE) ×6 IMPLANT
HANDPIECE INTERPULSE COAX TIP (DISPOSABLE) ×1
KIT BASIN OR (CUSTOM PROCEDURE TRAY) ×2 IMPLANT
KIT ROOM TURNOVER OR (KITS) ×2 IMPLANT
MANIFOLD NEPTUNE II (INSTRUMENTS) ×2 IMPLANT
NS IRRIG 1000ML POUR BTL (IV SOLUTION) ×2 IMPLANT
PACK TOTAL JOINT (CUSTOM PROCEDURE TRAY) ×2 IMPLANT
PAD ARMBOARD 7.5X6 YLW CONV (MISCELLANEOUS) ×4 IMPLANT
RTRCTR WOUND ALEXIS 18CM MED (MISCELLANEOUS) ×2
SET HNDPC FAN SPRY TIP SCT (DISPOSABLE) ×1 IMPLANT
SPONGE LAP 18X18 X RAY DECT (DISPOSABLE) IMPLANT
SPONGE LAP 4X18 X RAY DECT (DISPOSABLE) IMPLANT
STAPLER VISISTAT 35W (STAPLE) IMPLANT
SUT ETHIBOND NAB CT1 #1 30IN (SUTURE) ×4 IMPLANT
SUT MNCRL AB 3-0 PS2 27 (SUTURE) ×2 IMPLANT
SUT VIC AB 0 CT1 27 (SUTURE) ×2
SUT VIC AB 0 CT1 27XBRD ANBCTR (SUTURE) ×2 IMPLANT
SUT VIC AB 1 CT1 27 (SUTURE) ×2
SUT VIC AB 1 CT1 27XBRD ANBCTR (SUTURE) ×2 IMPLANT
SUT VIC AB 2-0 CT1 27 (SUTURE) ×2
SUT VIC AB 2-0 CT1 TAPERPNT 27 (SUTURE) ×2 IMPLANT
TOWEL OR 17X24 6PK STRL BLUE (TOWEL DISPOSABLE) ×2 IMPLANT
TOWEL OR 17X26 10 PK STRL BLUE (TOWEL DISPOSABLE) ×2 IMPLANT
TRAY FOLEY CATH 16FRSI W/METER (SET/KITS/TRAYS/PACK) ×2 IMPLANT
WATER STERILE IRR 1000ML POUR (IV SOLUTION) IMPLANT

## 2013-08-18 NOTE — Preoperative (Signed)
Beta Blockers   Reason not to administer Beta Blockers:Not Applicable 

## 2013-08-18 NOTE — Anesthesia Preprocedure Evaluation (Addendum)
Anesthesia Evaluation   Patient awake    Reviewed: Allergy & Precautions, H&P , NPO status , Patient's Chart, lab work & pertinent test results, reviewed documented beta blocker date and time   Airway Mallampati: II TM Distance: >3 FB Neck ROM: Limited    Dental  (+) Teeth Intact, Poor Dentition and Dental Advisory Given   Pulmonary shortness of breath and with exertion, COPD COPD inhaler and oxygen dependent, former smoker,    + decreased breath sounds      Cardiovascular hypertension, Pt. on medications and Pt. on home beta blockers Rhythm:Regular Rate:Normal     Neuro/Psych Anxiety    GI/Hepatic Neg liver ROS, GERD-  Medicated and Controlled,  Endo/Other  negative endocrine ROS  Renal/GU negative Renal ROS     Musculoskeletal   Abdominal Normal abdominal exam  (+)   Peds  Hematology   Anesthesia Other Findings   Reproductive/Obstetrics                          Anesthesia Physical Anesthesia Plan  ASA: III  Anesthesia Plan: General   Post-op Pain Management:    Induction: Intravenous  Airway Management Planned: Oral ETT  Additional Equipment:   Intra-op Plan:   Post-operative Plan: Possible Post-op intubation/ventilation  Informed Consent: I have reviewed the patients History and Physical, chart, labs and discussed the procedure including the risks, benefits and alternatives for the proposed anesthesia with the patient or authorized representative who has indicated his/her understanding and acceptance.   Dental advisory given  Plan Discussed with: CRNA, Anesthesiologist and Surgeon  Anesthesia Plan Comments:         Anesthesia Quick Evaluation

## 2013-08-18 NOTE — Brief Op Note (Signed)
08/18/2013  2:32 PM  PATIENT:  Stacey Bowman  72 y.o. female  PRE-OPERATIVE DIAGNOSIS:  Severe osteoarthritis left hip  POST-OPERATIVE DIAGNOSIS:  Severe osteoarthritis left hip  PROCEDURE:  Procedure(s): LEFT TOTAL HIP ARTHROPLASTY ANTERIOR APPROACH (Left)  SURGEON:  Surgeon(s) and Role:    * Kathryne Hitch, MD - Primary  PHYSICIAN ASSISTANT: Rexene Edison, PA-C  ANESTHESIA:   general  EBL:  Total I/O In: 1000 [I.V.:1000] Out: 430 [Urine:130; Blood:300]  BLOOD ADMINISTERED:none  DRAINS: none   LOCAL MEDICATIONS USED:  NONE  SPECIMEN:  No Specimen  DISPOSITION OF SPECIMEN:  N/A  COUNTS:  YES  TOURNIQUET:  * No tourniquets in log *  DICTATION: .Other Dictation: Dictation Number [id number]  PLAN OF CARE: Admit to inpatient   PATIENT DISPOSITION:  PACU - hemodynamically stable.   Delay start of Pharmacological VTE agent (>24hrs) due to surgical blood loss or risk of bleeding: no

## 2013-08-18 NOTE — Anesthesia Postprocedure Evaluation (Signed)
  Anesthesia Post-op Note  Patient: Stacey Bowman  Procedure(s) Performed: Procedure(s): LEFT TOTAL HIP ARTHROPLASTY ANTERIOR APPROACH (Left)  Patient Location: PACU  Anesthesia Type:General  Level of Consciousness: awake, oriented, sedated and patient cooperative  Airway and Oxygen Therapy: Patient Spontanous Breathing  Post-op Pain: mild  Post-op Assessment: Post-op Vital signs reviewed, Patient's Cardiovascular Status Stable, Respiratory Function Stable, Patent Airway, No signs of Nausea or vomiting and Pain level controlled  Post-op Vital Signs: stable  Complications: No apparent anesthesia complications

## 2013-08-18 NOTE — Progress Notes (Signed)
Utilization review completed.  

## 2013-08-18 NOTE — Transfer of Care (Signed)
Immediate Anesthesia Transfer of Care Note  Patient: Stacey Bowman  Procedure(s) Performed: Procedure(s): LEFT TOTAL HIP ARTHROPLASTY ANTERIOR APPROACH (Left)  Patient Location: PACU  Anesthesia Type:General  Level of Consciousness: Responds to stimulation, drowsy  Airway & Oxygen Therapy: Patient Spontanous Breathing and Patient connected to face mask oxygen  Post-op Assessment: Report given to PACU RN and Post -op Vital signs reviewed and stable  Post vital signs: Reviewed and stable  Complications: No apparent anesthesia complications

## 2013-08-18 NOTE — H&P (Signed)
TOTAL HIP ADMISSION H&P  Patient is admitted for left total hip arthroplasty.  Subjective:  Chief Complaint: left hip pain  HPI: Stacey Bowman, 72 y.o. female, has a history of pain and functional disability in the left hip(s) due to arthritis and patient has failed non-surgical conservative treatments for greater than 12 weeks to include NSAID's and/or analgesics, corticosteriod injections, use of assistive devices, weight reduction as appropriate and activity modification.  Onset of symptoms was abrupt starting 5 years ago with gradually worsening course since that time.The patient noted no past surgery on the left hip(s).  Patient currently rates pain in the left hip at 10 out of 10 with activity. Patient has night pain, worsening of pain with activity and weight bearing, trendelenberg gait, pain that interfers with activities of daily living and pain with passive range of motion. Patient has evidence of subchondral cysts, subchondral sclerosis, periarticular osteophytes and joint space narrowing by imaging studies. This condition presents safety issues increasing the risk of falls.  There is no current active infection.  Patient Active Problem List   Diagnosis Date Noted  . Degenerative arthritis of left hip 08/18/2013  . Iron deficiency anemia, unspecified 07/15/2013  . Endometrial ca 07/15/2013   Past Medical History  Diagnosis Date  . DDD (degenerative disc disease)   . Chronic bronchitis     was on Dexamethasone and has been off for surgery  . Endometrial adenocarcinoma 2012  . Lyme disease 2013  . Polyarteritis nodosa     was taking steroids-came off in May 2014  . DJD (degenerative joint disease)   . GERD (gastroesophageal reflux disease)     takes Omeprazole daily  . Emphysema     uses Advair inhaler bid and Albuterol prn  . Unspecified essential hypertension     takes Ramipril,Catapres,and Metoprolol daily  . Muscle spasms of lower extremity     takes Flexeril  daily as needed  . ADD (attention deficit disorder)     takes Ritalin daily  . OCD (obsessive compulsive disorder)   . Depression     takes Prozac daily  . Dysrhythmia     Rt BBB and Tacycardia  . Emphysema   . Shortness of breath     with exertion  . History of migraine   . Short-term memory loss   . Neuropathy   . Joint pain   . Joint swelling   . Back pain     stenosis  . Osteoporosis   . History of colon polyps   . Urinary frequency   . UTI (lower urinary tract infection) hx of    was on a Sulfa med-placed on it 08-05-13 as precaution for surgery  . Urinary urgency   . Anemia     iron deficiency  . History of staph infection 2013    Past Surgical History  Procedure Laterality Date  . Cholecystectomy    . Total abdominal hysterectomy w/ bilateral salpingoophorectomy    . Right arm surgery    . Right arm surgery    . Colonoscopy      No prescriptions prior to admission   No Known Allergies  History  Substance Use Topics  . Smoking status: Former Smoker    Types: Cigarettes  . Smokeless tobacco: Never Used     Comment: quit in 1997  . Alcohol Use: No     Comment: recovering alcoholic    Family History  Problem Relation Age of Onset  . Cervical cancer Maternal Aunt   .  Emphysema Mother   . Diabetes Father   . Diabetes Maternal Grandmother   . Heart disease Mother   . Heart disease Maternal Grandmother   . Lung disease Brother      Review of Systems  Musculoskeletal: Positive for joint pain.  All other systems reviewed and are negative.    Objective:  Physical Exam  Constitutional: She is oriented to person, place, and time. She appears well-developed and well-nourished.  HENT:  Head: Normocephalic and atraumatic.  Eyes: EOM are normal. Pupils are equal, round, and reactive to light.  Neck: Normal range of motion. Neck supple.  Cardiovascular: Normal rate and regular rhythm.   Respiratory: Effort normal and breath sounds normal.  GI: Soft.  Bowel sounds are normal.  Musculoskeletal:       Left hip: She exhibits decreased range of motion, decreased strength, bony tenderness and crepitus.  Neurological: She is alert and oriented to person, place, and time.  Skin: Skin is warm and dry.  Psychiatric: She has a normal mood and affect.    Vital signs in last 24 hours:    Labs:   Estimated body mass index is 25.89 kg/(m^2) as calculated from the following:   Height as of 07/15/13: 5\' 3"  (1.6 m).   Weight as of 07/15/13: 66.282 kg (146 lb 2 oz).   Imaging Review Plain radiographs demonstrate severe degenerative joint disease of the left hip(s). The bone quality appears to be good for age and reported activity level.  Assessment/Plan:  End stage arthritis, left hip(s)  The patient history, physical examination, clinical judgement of the provider and imaging studies are consistent with end stage degenerative joint disease of the left hip(s) and total hip arthroplasty is deemed medically necessary. The treatment options including medical management, injection therapy, arthroscopy and arthroplasty were discussed at length. The risks and benefits of total hip arthroplasty were presented and reviewed. The risks due to aseptic loosening, infection, stiffness, dislocation/subluxation,  thromboembolic complications and other imponderables were discussed.  The patient acknowledged the explanation, agreed to proceed with the plan and consent was signed. Patient is being admitted for inpatient treatment for surgery, pain control, PT, OT, prophylactic antibiotics, VTE prophylaxis, progressive ambulation and ADL's and discharge planning.The patient is planning to be discharged home with home health services

## 2013-08-19 ENCOUNTER — Inpatient Hospital Stay (HOSPITAL_COMMUNITY): Payer: Medicare Other

## 2013-08-19 ENCOUNTER — Encounter (HOSPITAL_COMMUNITY): Payer: Self-pay | Admitting: Emergency Medicine

## 2013-08-19 HISTORY — PX: JOINT REPLACEMENT: SHX530

## 2013-08-19 LAB — CBC
HCT: 21.5 % — ABNORMAL LOW (ref 36.0–46.0)
MCHC: 30.7 g/dL (ref 30.0–36.0)
MCV: 76.8 fL — ABNORMAL LOW (ref 78.0–100.0)
RDW: 16.4 % — ABNORMAL HIGH (ref 11.5–15.5)
WBC: 10.4 10*3/uL (ref 4.0–10.5)

## 2013-08-19 LAB — BASIC METABOLIC PANEL
BUN: 25 mg/dL — ABNORMAL HIGH (ref 6–23)
CO2: 24 mEq/L (ref 19–32)
Chloride: 97 mEq/L (ref 96–112)
Creatinine, Ser: 0.59 mg/dL (ref 0.50–1.10)
GFR calc Af Amer: >90 mL/min (ref >90)
GFR calc non Af Amer: >90 mL/min (ref >90)

## 2013-08-19 LAB — PREPARE RBC (CROSSMATCH)

## 2013-08-19 MED ORDER — ALTEPLASE 2 MG IJ SOLR
2.0000 mg | Freq: Once | INTRAMUSCULAR | Status: AC
  Administered 2013-08-19: 2 mg
  Filled 2013-08-19: qty 2

## 2013-08-19 MED ORDER — NYSTATIN 100000 UNIT/ML MT SUSP
5.0000 mL | Freq: Four times a day (QID) | OROMUCOSAL | Status: DC
  Administered 2013-08-19 – 2013-08-21 (×8): 500000 [IU] via ORAL
  Filled 2013-08-19 (×13): qty 5

## 2013-08-19 MED ORDER — CYCLOBENZAPRINE HCL 10 MG PO TABS
10.0000 mg | ORAL_TABLET | Freq: Every day | ORAL | Status: DC
  Administered 2013-08-19 – 2013-08-20 (×2): 10 mg via ORAL
  Filled 2013-08-19 (×3): qty 1

## 2013-08-19 MED ORDER — CYCLOBENZAPRINE HCL 10 MG PO TABS
5.0000 mg | ORAL_TABLET | Freq: Three times a day (TID) | ORAL | Status: DC | PRN
  Administered 2013-08-19 – 2013-08-20 (×3): 5 mg via ORAL
  Filled 2013-08-19 (×3): qty 1

## 2013-08-19 MED ORDER — FLUTICASONE-SALMETEROL 250-50 MCG/DOSE IN AEPB
1.0000 | INHALATION_SPRAY | Freq: Two times a day (BID) | RESPIRATORY_TRACT | Status: DC | PRN
  Administered 2013-08-20 (×2): 1 via RESPIRATORY_TRACT
  Filled 2013-08-19: qty 14

## 2013-08-19 MED ORDER — TRAMADOL HCL 50 MG PO TABS
50.0000 mg | ORAL_TABLET | Freq: Four times a day (QID) | ORAL | Status: DC | PRN
  Administered 2013-08-19 – 2013-08-20 (×3): 50 mg via ORAL
  Filled 2013-08-19 (×3): qty 1

## 2013-08-19 MED ORDER — NON FORMULARY: 250.0000 ug | Freq: Two times a day (BID) | Status: DC | PRN

## 2013-08-19 NOTE — Care Management Note (Signed)
CARE MANAGEMENT NOTE 08/19/2013  Patient:  Stacey Bowman, Stacey Bowman   Account Number:  [id number]  Date Initiated:  08/19/2013  Documentation initiated by:  Vance Peper  Subjective/Objective Assessment:   72 yr old female s/pleft total hip arthroplasty.     Action/Plan:   CM spoke with patient concerning home health and DME needs at discharge. Preoperatively setup with [facility], no changes.   Anticipated DC Date:  08/20/2013   Anticipated DC Plan:  HOME W HOME HEALTH SERVICES      DC Planning Services  CM consult      [facility] Choice  HOME HEALTH  DURABLE MEDICAL EQUIPMENT   Choice offered to / List presented to:  C-1 Patient   DME arranged  3-N-1      DME agency  [facility]     HH arranged  HH-2 PT      [facility] agency  [facility]   Status of service:  Completed, signed off   Discharge Disposition:  HOME W HOME HEALTH SERVICES

## 2013-08-19 NOTE — Progress Notes (Signed)
Patient refuses to take scheduled Oxycontin and prn Oxy IR.  Patient reports Oxycodone makes her nauseous, patient indicates she can only take Tramadol.  Patient also accidentally removed her peripheral IV; patient refused to have peripheral IV restarted by IV team.  Patient requested her port-a-cath be accessed.  IV team accessed port-a-cath but chest x-ray needed to confirm placement prior to use; awaiting result. AM labs will be drawn at a later time per patient request.    MD notified of patient's request to use port-a-cath and not peripheral IV.  MD also notified of patient's request to use Tramadol (for pain) and Flexeril (for muscle spasms).  Telephone orders received, readback and entered.

## 2013-08-19 NOTE — Plan of Care (Signed)
Problem: Consults Goal: Diagnosis- Total Joint Replacement Primary Total Hip Left     

## 2013-08-19 NOTE — Progress Notes (Signed)
Pt in need of 2 units of blood. Pt has right side port cath. Called IV team to evaluate whether or not RN can disconnect NS to connect Y filter tubing for blood. Jess, RN from IV team confirmed that RN can connect Blood Y filter tubing to right port cath. Jess also stated that RN needs to call when the 2 units of blood is finished.

## 2013-08-19 NOTE — Evaluation (Signed)
Physical Therapy Evaluation Patient Details Name: Stacey Bowman MRN: [id number] DOB: [birth year] Today's Date: 08/19/2013 Time: 4098-1191 PT Time Calculation (min): 32 min  PT Assessment / Plan / Recommendation History of Present Illness  Pt s/p L THA due to arthritis  Clinical Impression  Pt is s/p THA resulting in the deficits listed below (see PT Problem List). Pt moves at MIN/guard to MIN A level, but unable to tolerate gait at eval due to pain and anxiety.  Pt will benefit from skilled PT to increase their independence and safety with mobility to allow discharge to the venue listed below. Pt declines CIR or SNF.  Pt does have a roommate at home who has been assisting her as needed.  Will need HHPT upon d/c from acute care.     PT Assessment  Patient needs continued PT services    Follow Up Recommendations  Home health PT (Pt refusing CIR consult or SNF. )    Does the patient have the potential to tolerate intense rehabilitation      Barriers to Discharge        Equipment Recommendations  None recommended by PT    Recommendations for Other Services     Frequency 7X/week    Precautions / Restrictions Restrictions Weight Bearing Restrictions: Yes LLE Weight Bearing: Weight bearing as tolerated   Pertinent Vitals/Pain o2 in 90s on 2 L/min, HR 116 with transfers,  Pain 10/10 with nursing aware and ice applied.      Mobility  Bed Mobility Bed Mobility: Supine to Sit Supine to Sit: With rails;HOB elevated;4: Min guard Details for Bed Mobility Assistance: Pt moves quickly and yelling with movement.  "I am going to yell.  Just ignore it." Transfers Transfers: Sit to Stand;Stand to Sit Sit to Stand: 4: Min guard Stand to Sit: 4: Min guard Details for Transfer Assistance: Pt moves quicklky due to pain to "get it over with".  Impulsive.  Cues for safety. Ambulation/Gait Ambulation/Gait Assistance: 4: Min assist Ambulation Distance (Feet): 2 Feet Assistive device:  Rolling walker Ambulation/Gait Assistance Details: Pt took a few steps from bed > BSC > recliner with RW.  Cueing for UE use to decrese WB.  Gait Pattern: Decreased stance time - right;Antalgic;Trunk flexed    Exercises     PT Diagnosis: Difficulty walking  PT Problem List: Decreased strength;Decreased activity tolerance;Decreased balance;Decreased mobility;Decreased range of motion;Pain PT Treatment Interventions: Gait training;Functional mobility training;Therapeutic activities;Therapeutic exercise     PT Goals(Current goals can be found in the care plan section) Acute Rehab PT Goals Patient Stated Goal: To go home and to get stronger.  Pt does not want to go to rehab. PT Goal Formulation: With patient Time For Goal Achievement: 08/26/13 Potential to Achieve Goals: Good  Visit Information  Last PT Received On: 08/19/13 Assistance Needed: +1 History of Present Illness: Pt s/p L THA due to arthritis       Prior Functioning  Home Living Family/patient expects to be discharged to:: Private residence Living Arrangements: Non-relatives/Friends Available Help at Discharge: Friend(s);Available 24 hours/day (roommate) Type of Home: House Home Access: Ramped entrance Home Layout: One level;Able to live on main level with bedroom/bathroom Home Equipment: Wheelchair - [employer] - 2 wheels;Cane - single point;Shower seat Prior Function Level of Independence: Independent with assistive device(s) Comments: Had started using w/c about 3 months ago due to pain.  Prior to 3 months ago, pt was independent with all mobility. Communication Communication: No difficulties    Cognition  Cognition Arousal/Alertness: Awake/alert  Behavior During Therapy: WFL for tasks assessed/performed Overall Cognitive Status: Within Functional Limits for tasks assessed    Extremity/Trunk Assessment Lower Extremity Assessment Lower Extremity Assessment: Overall WFL for tasks assessed;LLE  deficits/detail LLE: Unable to fully assess due to pain   Balance    End of Session PT - End of Session Equipment Utilized During Treatment: Gait belt Activity Tolerance: Patient limited by pain Patient left: in chair;with call bell/phone within reach Nurse Communication: Patient requests pain meds  GP     Adora Yeh LUBECK 08/19/2013, 11:00 AM

## 2013-08-19 NOTE — Progress Notes (Signed)
IV team called to confirm that Rn may connect IV line to patients accessed porta cath.

## 2013-08-19 NOTE — Op Note (Signed)
NAME:  Stacey Bowman, Stacey Bowman        ACCOUNT NO.:  [id number]  MEDICAL RECORD NO.:  [id number]  LOCATION:  5N08C                        FACILITY:  [facility]  PHYSICIAN:  Vanita Panda. Magnus Ivan, M.D.DATE OF BIRTH:  Sep 28, 1940  DATE OF PROCEDURE:  08/18/2013 DATE OF DISCHARGE:                              OPERATIVE REPORT   PREOPERATIVE DIAGNOSES:  Severe end-stage arthritis and degenerative joint disease, left hip.  POSTOPERATIVE DIAGNOSES:  Severe end-stage arthritis and degenerative joint disease, left hip.  PROCEDURE:  Left total hip arthroplasty direct anterior approach.  IMPLANTS:  DePuy Sector Gription acetabular component size 48 with 1 screw and apex eliminator guide, size 32+ 4 neutral polyethylene liner, size 10 Corail femoral component with varus offset (KLA), size 32+ 1 ceramic hip ball.  SURGEON:  Doneen Poisson, MD.  ASSISTING:  Richardean Canal, PA-C  ANESTHESIA:  General.  ANTIBIOTICS:  2 g IV Ancef.  BLOOD LOSS:  300 mL.  COMPLICATIONS:  None.  INDICATIONS:  Ms. Stacey Bowman is a 72 year old female well known to me.  She has x-rays that show complete collapse over femoral head with flattening, loss of articular surface, subchondral sclerosis, cystic changes, and periarticular osteophytes.  She had daily pain, poor mobility, and decreased quality of life.  With the failure of conservative treatment including anti-inflammatories injections and pain medications, she wished to proceed with a total hip arthroplasty.  The risks and benefits of this have been explained to her in detail including the risk of acute blood loss anemia, nerve or vessel injury, fracture, infection, and DVT.  The goals are decreased pain, improved mobility, and overall improved quality of life.  PROCEDURE DESCRIPTION:  After informed consent was obtained, appropriate left hip was marked, she was brought to the operating room and general anesthesia was obtained while she was on her  stretcher.  Foley catheter was placed and traction boots were placed on both of her feet.  It was interesting trying to figure out her leg lengths because both knees have slight flexion contracture and made sure seemed equal on leg length even though x-ray wise, she seems to be much shorter on the left than the right.  We then placed her supine on the Hana fracture table with the perineal post in place and both legs in inline skeletal traction devices, but no traction applied.  Her left operative hip was then prepped and draped with DuraPrep and sterile drapes.  A time-out was called to identify correct patient, correct left hip.  We then made an incision just posterior and inferior to the anterosuperior iliac spine and carried this obliquely down the leg.  We dissected down to the tensor fascia lata muscle and the tensor fascia was then divided longitudinally.  We then proceeded with a direct anterior approach of the hip.  We cauterized the lateral femoral circumflex vessels and then placed a Cobra retractor around the lateral neck and up underneath the rectus femoris, Cobra retractor medially.  I then opened up the hip capsule in a L-type format finding a large joint effusion.  We placed the Cobra retractors within the hip capsule.  We then made our femoral neck cut almost at the level of the calcar due to her being so tight and  removed the femoral head in its entirety and found it completely flat and then devoid of cartilage.  We then placed __________medially and a Cobra retractor laterally __________acetabulum remnants and debris and then began reaming from a size 42 reamer in 2 mm increments up to size 48.  With all reamers placed under direct visualization and last reamer also on direct fluoroscopy, so we could obtain our depth of reamer inclination and anteversion.  Once I was pleased with this, I placed the real DePuy Sector Gription acetabular component size 48 and a  single screw as well as the apex hole eliminator guide.  We then placed a 36+ 4 neutral polyethylene liner.  Attention was then turned to the femur with the leg externally rotated to 100 degrees, extended and adducted __________ medially and Hohmann retractor behind the greater trochanter, we released lateral joint capsule.  I made a broaching from a size 8 broach up to a size 10 broach.  Given her anatomy, we trialed a varus neck and a 32+ 1 hip ball, she was quite tight.  We were able to bring her leg back up and over with traction, internal rotation, reduced the pelvis and this did mimic her preoperative anatomy.  Rotated up to 95 degrees and she was stable.  She had no shuck and good stability on internal rotation.  We then dislocated the hip and removed the trial components.  We placed the real Corail femoral component with varus offset and the real 32+ 1 ceramic hip ball, reduced it back in the acetabulum and was stable.  We copiously irrigated the soft tissue with normal saline solution using pulsatile lavage.  We closed the joint capsule with interrupted #1 Ethibond suture followed by #1 Vicryl in the tensor fascia, 2-0 Vicryl in subcutaneous tissue, 4-0 Monocryl subcuticular stitch and Steri-Strips on the skin and Aquacel dressing. She was taken off the Hana table, awakened, extubated, and taken to recovery room in stable condition.  All final counts were correct and no complications noted.  Of note, Richardean Canal, PA-C was present the entire case and his presence was very crucial for patient positioning, retracting, and closure of the wound.     Vanita Panda. Magnus Ivan, M.D.     CYB/MEDQ  D:  08/18/2013  T:  08/19/2013  Job:  [id number]

## 2013-08-19 NOTE — Clinical Documentation Improvement (Signed)
THIS DOCUMENT IS NOT A PERMANENT PART OF THE MEDICAL RECORD  Please update your documentation with the medical record to reflect your response to this query. If you need help knowing how to do this please call [phone].  08/19/13  Dr. Magnus Ivan,  In an effort to better capture your patient's severity of illness, reflect appropriate length of stay and utilization of resources, a review of the patient medical record has revealed the following information:   - Preop H&H    10.7/34.2   - Postop H&H    6.6/21.5   - EBL per Anesthesia Record -   - introperative IV fluids per Anesthesia Record   - Transfused 2 units of packed cells 08/19/13     Based on your clinical judgment, please document in the progress notes and discharge summary if a condition below provides greater specificity regarding the patient's CBC result and treatment provided:   - Combined Acute Blood Loss and Precipitous Drop in HCT Anemia   - Acute Blood Loss Anemia   - Precipitous Drop in HCT   - Other Condition   - Unable to Clinically Determine   In responding to this query please exercise your independent judgment.    The fact that a query is asked, does not imply that any particular answer is desired or expected.    Reviewed:  08/20/13 - acute on chronic blood loss anemia documented pn 08/20/13 Dr. Magnus Ivan.  Mathis Dad RN  Thank You,  Jerral Ralph  RN BSN CCDS Certified Clinical Documentation Specialist: [phone] [facility] Information [facility] [place]

## 2013-08-19 NOTE — Progress Notes (Signed)
Physical Therapy Treatment Patient Details Name: Kassiah Mccrory MRN: [id number] DOB: [birth year] Today's Date: 08/19/2013 Time: 4098-1191 PT Time Calculation (min): 39 min  PT Assessment / Plan / Recommendation  History of Present Illness Pt s/p L THA due to arthritis, H/O DDD, emphysema   PT Comments   Pain is continuing to limit standing tolerance; Needs cues to slow down  Can sometimes sit without warning; will need reinforcement for safety  Follow Up Recommendations  Home health PT (Pt refusing CIR consult or SNF. )     Does the patient have the potential to tolerate intense rehabilitation     Barriers to Discharge        Equipment Recommendations  None recommended by PT    Recommendations for Other Services    Frequency 7X/week   Progress towards PT Goals Progress towards PT goals: Progressing toward goals  Plan Current plan remains appropriate    Precautions / Restrictions Precautions Precautions: Fall Restrictions LLE Weight Bearing: Weight bearing as tolerated   Pertinent Vitals/Pain 10+/10 L hip patient repositioned for comfort     Mobility  Bed Mobility Bed Mobility: Supine to Sit;Sit to Supine Supine to Sit: With rails;HOB elevated;4: Min guard Sit to Supine: 4: Min guard Details for Bed Mobility Assistance: Pt moves quickly and yelling with movement.  "I am going to yell.  Just ignore it." cues to preposition close to EOB before getting up Transfers Transfers: Sit to Stand;Stand to Sit Sit to Stand: 4: Min guard Stand to Sit: 4: Min guard Details for Transfer Assistance: Pt moves quicklky due to pain to "get it over with".  Impulsive.  Cues for safety and hand placement, but less need for cues with repetition Ambulation/Gait Ambulation/Gait Assistance: 4: Min assist Ambulation Distance (Feet): 4 Feet (forward and backward) Assistive device: Rolling walker Ambulation/Gait Assistance Details: Verbal and demo cues for gait sequence and to use RW to  unweigh painful LLE in hopes of ambulating further; Cues also to get whole L eft foot onto floor instead of toe-touching Gait Pattern: Antalgic;Trunk flexed;Decreased step length - right;Decreased stance time - left    Exercises     PT Diagnosis:    PT Problem List:   PT Treatment Interventions:     PT Goals (current goals can now be found in the care plan section) Acute Rehab PT Goals Patient Stated Goal: To go home and to get stronger.  Pt does not want to go to rehab. PT Goal Formulation: With patient Time For Goal Achievement: 08/26/13 Potential to Achieve Goals: Good  Visit Information  Last PT Received On: 08/19/13 Assistance Needed: +1 History of Present Illness: Pt s/p L THA due to arthritis, H/O DDD, emphysema    Subjective Data  Subjective: Quite anxious about pain Patient Stated Goal: To go home and to get stronger.  Pt does not want to go to rehab.   Cognition  Cognition Arousal/Alertness: Awake/alert Behavior During Therapy: WFL for tasks assessed/performed;Anxious Overall Cognitive Status: Within Functional Limits for tasks assessed    Balance     End of Session PT - End of Session Equipment Utilized During Treatment: Gait belt Activity Tolerance: Patient limited by pain Patient left: in chair;with call bell/phone within reach Nurse Communication: Patient requests pain meds   GP     Van Clines [facility] [place], [place] [phone]  08/19/2013, 4:39 PM

## 2013-08-19 NOTE — Evaluation (Signed)
Occupational Therapy Evaluation Patient Details Name: Stacey Bowman MRN: [id number] DOB: 1941/06/24 Today's Date: 08/19/2013 Time: 4098-1191 OT Time Calculation (min): 54 min  OT Assessment / Plan / Recommendation History of present illness Pt s/p L THA due to arthritis, H/O DDD, emphysema   Clinical Impression   Pt presents with mobility limitations and dependence in ADL due to above.  Educated in use of AE and 3 in 1.  Pt has a tub bench for home and has been reliant on her friend for assist with ADL and IADL.  Limited mobility due to anxiety and pain in back this visit. No further OT needs.    OT Assessment  Patient does not need any further OT services    Follow Up Recommendations  No OT follow up;Supervision/Assistance - 24 hour    Barriers to Discharge      Equipment Recommendations       Recommendations for Other Services    Frequency       Precautions / Restrictions Precautions Precautions: Fall   Pertinent Vitals/Pain Back pain, did not rate, iced, repositioned, taught log roll for bed mobility    ADL  Eating/Feeding: Independent Where Assessed - Eating/Feeding: Edge of bed Grooming: Wash/dry hands;Wash/dry face;Set up Where Assessed - Grooming: Unsupported sitting Upper Body Bathing: Set up Where Assessed - Upper Body Bathing: Unsupported sitting Lower Body Bathing: Min guard Where Assessed - Lower Body Bathing: Unsupported sitting;Supported sit to stand Upper Body Dressing: Set up Where Assessed - Upper Body Dressing: Unsupported sitting Lower Body Dressing: Min guard Where Assessed - Lower Body Dressing: Unsupported sitting;Supported sit to stand Toilet Transfer: Min Pension scheme manager Method: Sit to Barista: Bedside commode Toileting - Architect and Hygiene: Min guard Where Assessed - Engineer, mining and Hygiene: Sit to stand from 3-in-1 or toilet Equipment Used: Long-handled shoe  horn;Long-handled sponge;Reacher;Rolling walker;Sock aid;Gait belt ADL Comments: Issued and instructed in use of AE.  Pt is aware of multiple uses of 3 in 1.    OT Diagnosis:    OT Problem List:   OT Treatment Interventions:     OT Goals(Current goals can be found in the care plan section) Acute Rehab OT Goals Patient Stated Goal: To go home and to get stronger.  Pt does not want to go to rehab.  Visit Information  Last OT Received On: 08/19/13 Assistance Needed: +1 History of Present Illness: Pt s/p L THA due to arthritis, H/O DDD, emphysema       Prior Functioning     Home Living Family/patient expects to be discharged to:: Private residence Living Arrangements: Non-relatives/Friends Available Help at Discharge: Friend(s);Available 24 hours/day Type of Home: House Home Access: Ramped entrance Home Layout: One level;Able to live on main level with bedroom/bathroom Home Equipment: Wheelchair - [employer] - 2 wheels;Cane - single point;Tub bench (BSC delivered to room already) Prior Function Level of Independence: Needs assistance Gait / Transfers Assistance Needed: primarily used w/c for mobility ADL's / Homemaking Assistance Needed: Friend assisted with homemaking and LB bathing and dressing. Comments: Had started using w/c about 3 months ago due to pain.  Prior to 3 months ago, pt was independent with all mobility. Communication Communication: No difficulties Dominant Hand: Right         Vision/Perception Vision - History Baseline Vision: Wears glasses only for reading Patient Visual Report: No change from baseline   Cognition  Cognition Arousal/Alertness: Awake/alert Behavior During Therapy: WFL for tasks assessed/performed Overall Cognitive Status: Within Functional  Limits for tasks assessed    Extremity/Trunk Assessment Upper Extremity Assessment Upper Extremity Assessment: Overall WFL for tasks assessed (arthritic changes in hands) Lower Extremity  Assessment Lower Extremity Assessment: Defer to PT evaluation LLE: Unable to fully assess due to pain Cervical / Trunk Assessment Cervical / Trunk Assessment:  (chronic back pain)     Mobility Bed Mobility Bed Mobility: Supine to Sit;Sit to Supine Supine to Sit: 6: Modified independent (Device/Increase time) Sit to Supine: 6: Modified independent (Device/Increase time) Details for Bed Mobility Assistance: instructed in log roll technique to reduce back pain Transfers Transfers: Sit to Stand;Stand to Sit Sit to Stand: 4: Min guard Stand to Sit: 4: Min guard Details for Transfer Assistance: Pt moves quicklky due to pain to "get it over with".  Impulsive.  Cues for safety.     Exercise     Balance     End of Session OT - End of Session Activity Tolerance: Patient limited by pain Patient left: in bed;with call bell/phone within reach;with bed alarm set  GO     Evern Bio 08/19/2013, 2:07 PM [phone]

## 2013-08-19 NOTE — Progress Notes (Signed)
Subjective: 1 Day Post-Op Procedure(s) (LRB): LEFT TOTAL HIP ARTHROPLASTY ANTERIOR APPROACH (Left) Patient reports pain as mild.  Complaining of thrush.  Objective: Vital signs in last 24 hours: Temp:  [97.4 F (36.3 C)-97.9 F (36.6 C)] 97.7 F (36.5 C) (12/17 0528) Pulse Rate:  [80-87] 80 (12/17 0528) Resp:  [13-19] 18 (12/17 0528) BP: (93-110)/(36-95) 98/56 mmHg (12/17 0528) SpO2:  [93 %-100 %] 99 % (12/17 0528)  Intake/Output from previous day: 12/16 0701 - 12/17 0700 In: 1991.3 [P.O.:320; I.V.:1621.3; IV Piggyback:50] Out: 1330 [Urine:1030; Blood:300] Intake/Output this shift:    No results found for this basename: HGB,  in the last 72 hours No results found for this basename: WBC, RBC, HCT, PLT,  in the last 72 hours No results found for this basename: NA, K, CL, CO2, BUN, CREATININE, GLUCOSE, CALCIUM,  in the last 72 hours No results found for this basename: LABPT, INR,  in the last 72 hours  Left LEG: Sensation intact distally Intact pulses distally Dorsiflexion/Plantar flexion intact Incision: dressing C/D/I Compartment soft  Assessment/Plan: 1 Day Post-Op Procedure(s) (LRB): LEFT TOTAL HIP ARTHROPLASTY ANTERIOR APPROACH (Left) Up with therapy Treat Thrush Check labs when available  Richardean Canal 08/19/2013, 8:43 AM

## 2013-08-19 NOTE — Progress Notes (Signed)
Pt refused her Dulera inhaler stating she uses Advair at home because when she tried the [facility] it "hurt her lungs". She stated she was having her friend bring in her Advair this morning and she dos not want to take the [facility].

## 2013-08-20 ENCOUNTER — Encounter (HOSPITAL_COMMUNITY): Payer: Self-pay | Admitting: Orthopaedic Surgery

## 2013-08-20 LAB — CBC
HCT: 31.2 % — ABNORMAL LOW (ref 36.0–46.0)
Hemoglobin: 10.1 g/dL — ABNORMAL LOW (ref 12.0–15.0)
MCH: 25.6 pg — ABNORMAL LOW (ref 26.0–34.0)
MCV: 79.2 fL (ref 78.0–100.0)
Platelets: 216 10*3/uL (ref 150–400)
RBC: 3.94 MIL/uL (ref 3.87–5.11)

## 2013-08-20 MED ORDER — SODIUM CHLORIDE 0.9 % IJ SOLN
10.0000 mL | INTRAMUSCULAR | Status: DC | PRN
  Administered 2013-08-20: 10 mL
  Administered 2013-08-20: 20 mL
  Administered 2013-08-21: 10 mL

## 2013-08-20 NOTE — Progress Notes (Signed)
Physical Therapy Treatment Patient Details Name: Stacey Bowman MRN: [id number] DOB: [birth year] Today's Date: 08/20/2013 Time: 4098-1191 PT Time Calculation (min): 23 min  PT Assessment / Plan / Recommendation  History of Present Illness Pt s/p L THA due to arthritis, H/O DDD, emphysema   PT Comments   Pt progressing with mobility; increased ambulation distance & required decreased assistance for all tasks.  Pt presents at supervision level for transfers & min guard for ambulation.     Follow Up Recommendations  Home health PT;Other (comment) (pt refusing CIR or SNF)     Does the patient have the potential to tolerate intense rehabilitation     Barriers to Discharge        Equipment Recommendations  None recommended by PT    Recommendations for Other Services    Frequency 7X/week   Progress towards PT Goals Progress towards PT goals: Progressing toward goals  Plan Current plan remains appropriate    Precautions / Restrictions Precautions Precautions: Fall Restrictions LLE Weight Bearing: Weight bearing as tolerated   Pertinent Vitals/Pain Episodes of yelling out with activity due to LLE pain but did not rate however she did state pain is not at surgical site.      Mobility  Bed Mobility Bed Mobility: Supine to Sit;Sitting - Scoot to Edge of Bed Supine to Sit: 6: Modified independent (Device/Increase time);With rails;HOB flat Sitting - Scoot to Edge of Bed: 6: Modified independent (Device/Increase time) Details for Bed Mobility Assistance: Pt moves quickly and yelling with movement.  "I am going to yell.  Just ignore it." cues to preposition close to EOB before getting up Transfers Transfers: Sit to Stand;Stand to Sit Sit to Stand: 5: Supervision;With upper extremity assist;With armrests;From bed;From chair/3-in-1 Stand to Sit: 5: Supervision;With upper extremity assist;With armrests;To chair/3-in-1 Details for Transfer Assistance: Pt demonstrated safe hand  placement.  Less impulsive but still yells with pain.    Ambulation/Gait Ambulation/Gait Assistance: 4: Min guard Ambulation Distance (Feet): 30 Feet (10' + 20') Assistive device: Rolling walker Ambulation/Gait Assistance Details: cues for sequencing & RW use.   Gait Pattern: Decreased weight shift to left;Antalgic Gait velocity: decreased General Gait Details: required seated rest break after 10' due to fatigue & pain but able to ambulate another 20' after resting.  Pt does yell at times but she states "this is normal.  i'm sorry".   Stairs: No Wheelchair Mobility Wheelchair Mobility: No    Exercises Total Joint Exercises Ankle Circles/Pumps: AROM;Both;10 reps Heel Slides: AAROM;Strengthening;Left;10 reps Hip ABduction/ADduction: AAROM;Strengthening;Left;10 reps Straight Leg Raises: AAROM;Strengthening;Left;10 reps     PT Goals (current goals can now be found in the care plan section) Acute Rehab PT Goals Patient Stated Goal: To go home and to get stronger.  Pt does not want to go to rehab. PT Goal Formulation: With patient Time For Goal Achievement: 08/26/13 Potential to Achieve Goals: Good  Visit Information  Last PT Received On: 08/20/13 Assistance Needed: +1 History of Present Illness: Pt s/p L THA due to arthritis, H/O DDD, emphysema    Subjective Data  Subjective: Quite anxious about pain Patient Stated Goal: To go home and to get stronger.  Pt does not want to go to rehab.   Cognition  Cognition Arousal/Alertness: Awake/alert Behavior During Therapy: WFL for tasks assessed/performed;Anxious Overall Cognitive Status: Within Functional Limits for tasks assessed    Balance     End of Session PT - End of Session Equipment Utilized During Treatment: Gait belt Activity Tolerance: Patient tolerated treatment well  Patient left: in chair;with call bell/phone within reach Nurse Communication: Mobility status   GP     Lara Mulch 08/20/2013, 9:44  AM   Verdell Face, PTA [phone] 08/20/2013

## 2013-08-20 NOTE — Progress Notes (Signed)
Subjective: 2 Days Post-Op Procedure(s) (LRB): LEFT TOTAL HIP ARTHROPLASTY ANTERIOR APPROACH (Left) Patient reports pain as moderate.  Receiving blood due to acute on chronic blood loss anemia.  She has chronic anemia, but blood loss which was not much from surgery, contributed to her anemia.  Receiving 2 units of blood.  Objective: Vital signs in last 24 hours: Temp:  [98.3 F (36.8 C)-100.2 F (37.9 C)] 99.4 F (37.4 C) (12/18 0630) Pulse Rate:  [96-115] 110 (12/18 0630) Resp:  [17-19] 18 (12/18 0630) BP: (96-134)/(45-79) 118/54 mmHg (12/18 0630) SpO2:  [88 %-96 %] 94 % (12/18 0630)  Intake/Output from previous day: 12/17 0701 - 12/18 0700 In: 2270 [P.O.:720; I.V.:900; Blood:650] Out: 850 [Urine:850] Intake/Output this shift: Total I/O In: 1550 [I.V.:900; Blood:650] Out: 550 [Urine:550]   Recent Labs  08/19/13 1600  HGB 6.6*    Recent Labs  08/19/13 1600  WBC 10.4  RBC 2.80*  HCT 21.5*  PLT 225    Recent Labs  08/19/13 1600  NA 130*  K 3.6  CL 97  CO2 24  BUN 25*  CREATININE 0.59  GLUCOSE 122*  CALCIUM 7.2*   No results found for this basename: LABPT, INR,  in the last 72 hours  Sensation intact distally Intact pulses distally Dorsiflexion/Plantar flexion intact Incision: dressing C/D/I  Assessment/Plan: 2 Days Post-Op Procedure(s) (LRB): LEFT TOTAL HIP ARTHROPLASTY ANTERIOR APPROACH (Left) Up with therapy  Oral Remache Y 08/20/2013, 6:39 AM

## 2013-08-20 NOTE — Progress Notes (Signed)
Physical Therapy Treatment Patient Details Name: Stacey Bowman MRN: [id number] DOB: 1941-03-31 Today's Date: 08/20/2013 Time: 4098-1191 PT Time Calculation (min): 24 min  PT Assessment / Plan / Recommendation  History of Present Illness Pt s/p L THA due to arthritis, H/O DDD, emphysema   PT Comments   Pt moves fairly well however does cont to yell out due to pain at times but presents at supervision level for transfers & ambulation.  Pt seems to be more limited by fatigue rather than pain.     Follow Up Recommendations  Home health PT;Other (comment)     Does the patient have the potential to tolerate intense rehabilitation     Barriers to Discharge        Equipment Recommendations  None recommended by PT    Recommendations for Other Services    Frequency 7X/week   Progress towards PT Goals Progress towards PT goals: Progressing toward goals  Plan Current plan remains appropriate    Precautions / Restrictions Precautions Precautions: Fall Restrictions LLE Weight Bearing: Weight bearing as tolerated        Mobility  Bed Mobility Bed Mobility: Sit to Supine;Supine to Sit;Sitting - Scoot to Edge of Bed Supine to Sit: 6: Modified independent (Device/Increase time) Sitting - Scoot to Edge of Bed: 6: Modified independent (Device/Increase time) Sit to Supine: 4: Min assist;HOB flat Details for Bed Mobility Assistance: (A) to lift LE's back into bed.  Used logrolling technique due to back pain Transfers Transfers: Sit to Stand;Stand to Sit Sit to Stand: 5: Supervision;With upper extremity assist;With armrests;From chair/3-in-1;From bed Stand to Sit: 5: Supervision;With upper extremity assist;With armrests;To chair/3-in-1;To bed Ambulation/Gait Ambulation/Gait Assistance: 5: Supervision Ambulation Distance (Feet): 45 Feet (10' + 35' ) Assistive device: Rolling walker Ambulation/Gait Assistance Details: encouragement to increase distance.  Fatigues quickly.   Gait  Pattern: Step-to pattern;Decreased step length - right;Antalgic Gait velocity: decreased Stairs: No Wheelchair Mobility Wheelchair Mobility: No    Exercises     PT Diagnosis:    PT Problem List:   PT Treatment Interventions:     PT Goals (current goals can now be found in the care plan section) Acute Rehab PT Goals Patient Stated Goal: To go home and to get stronger.  Pt does not want to go to rehab. PT Goal Formulation: With patient Time For Goal Achievement: 08/26/13 Potential to Achieve Goals: Good  Visit Information  Last PT Received On: 08/20/13 Assistance Needed: +1 History of Present Illness: Pt s/p L THA due to arthritis, H/O DDD, emphysema    Subjective Data  Subjective: "im going home tomorrow" Patient Stated Goal: To go home and to get stronger.  Pt does not want to go to rehab.   Cognition  Cognition Arousal/Alertness: Awake/alert Behavior During Therapy: WFL for tasks assessed/performed;Anxious Overall Cognitive Status: Within Functional Limits for tasks assessed    Balance     End of Session PT - End of Session Activity Tolerance: Patient tolerated treatment well;Patient limited by fatigue Patient left: in bed;with call bell/phone within reach Nurse Communication: Mobility status   GP     Lara Mulch 08/20/2013, 2:47 PM  Verdell Face, PTA [phone] 08/20/2013

## 2013-08-21 LAB — CBC
HCT: 31.2 % — ABNORMAL LOW (ref 36.0–46.0)
Hemoglobin: 10.4 g/dL — ABNORMAL LOW (ref 12.0–15.0)
MCHC: 33.3 g/dL (ref 30.0–36.0)
MCV: 78.2 fL (ref 78.0–100.0)
RDW: 16.4 % — ABNORMAL HIGH (ref 11.5–15.5)

## 2013-08-21 LAB — TYPE AND SCREEN
Antibody Screen: NEGATIVE
Unit division: 0
Unit division: 0

## 2013-08-21 MED ORDER — ASPIRIN 325 MG PO TBEC: 325.0000 mg | DELAYED_RELEASE_TABLET | Freq: Two times a day (BID) | ORAL | Status: DC

## 2013-08-21 MED ORDER — OXYCODONE HCL 5 MG PO TABS: 2.5000 mg | ORAL_TABLET | Freq: Every day | ORAL | Status: DC

## 2013-08-21 MED ORDER — FLUTICASONE-SALMETEROL 250-50 MCG/DOSE IN AEPB
1.0000 | INHALATION_SPRAY | Freq: Two times a day (BID) | RESPIRATORY_TRACT | Status: DC
  Administered 2013-08-21: 09:00:00 1 via RESPIRATORY_TRACT

## 2013-08-21 NOTE — Discharge Summary (Signed)
Patient ID: Anwita Mencer MRN: [id number] DOB/AGE: 1941/09/02 72 y.o.  Admit date: 08/18/2013 Discharge date: 08/21/2013  Admission Diagnoses:  Principal Problem:   Degenerative arthritis of left hip Active Problems:   Status post THR (total hip replacement)   Discharge Diagnoses:  Same  Past Medical History  Diagnosis Date  . DDD (degenerative disc disease)   . Chronic bronchitis     was on Dexamethasone and has been off for surgery  . Endometrial adenocarcinoma 2012  . Lyme disease 2013  . Polyarteritis nodosa     was taking steroids-came off in May 2014  . DJD (degenerative joint disease)   . GERD (gastroesophageal reflux disease)     takes Omeprazole daily  . Emphysema     uses Advair inhaler bid and Albuterol prn  . Unspecified essential hypertension     takes Ramipril,Catapres,and Metoprolol daily  . Muscle spasms of lower extremity     takes Flexeril daily as needed  . ADD (attention deficit disorder)     takes Ritalin daily  . OCD (obsessive compulsive disorder)   . Depression     takes Prozac daily  . Dysrhythmia     Rt BBB and Tacycardia  . Emphysema   . Shortness of breath     with exertion  . History of migraine   . Short-term memory loss   . Neuropathy   . Joint pain   . Joint swelling   . Back pain     stenosis  . Osteoporosis   . History of colon polyps   . Urinary frequency   . UTI (lower urinary tract infection) hx of    was on a Sulfa med-placed on it 08-05-13 as precaution for surgery  . Urinary urgency   . Anemia     iron deficiency  . History of staph infection 2013    Surgeries: Procedure(s): LEFT TOTAL HIP ARTHROPLASTY ANTERIOR APPROACH on 08/18/2013   Consultants:    Discharged Condition: Improved  Hospital Course: Dakia Lanisa Ishler is an 72 y.o. female who was admitted 08/18/2013 for operative treatment ofDegenerative arthritis of hip. Patient has severe unremitting pain that affects sleep, daily activities, and  work/hobbies. After pre-op clearance the patient was taken to the operating room on 08/18/2013 and underwent  Procedure(s): LEFT TOTAL HIP ARTHROPLASTY ANTERIOR APPROACH.    Patient was given perioperative antibiotics: Anti-infectives   Start     Dose/Rate Route Frequency Ordered Stop   08/18/13 1800  ceFAZolin (ANCEF) IVPB 1 g/50 mL premix     1 g 100 mL/hr over 30 Minutes Intravenous Every 6 hours 08/18/13 1615 08/19/13 0030   08/18/13 0600  ceFAZolin (ANCEF) IVPB 2 g/50 mL premix     2 g 100 mL/hr over 30 Minutes Intravenous On call to O.R. 08/17/13 1447 08/18/13 1242       Patient was given sequential compression devices, early ambulation, and chemoprophylaxis to prevent DVT.  Patient benefited maximally from hospital stay and there were no complications.    Recent vital signs: Patient Vitals for the past 24 hrs:  BP Temp Pulse Resp SpO2 Weight  08/21/13 0633 116/55 mmHg 98.7 F (37.1 C) 95 18 98 % -  08/21/13 0400 - - - 18 - -  08/21/13 0000 - - - 18 - -  08/20/13 2139 112/50 mmHg 98.6 F (37 C) 94 18 99 % -  08/20/13 1400 111/51 mmHg 98.9 F (37.2 C) 97 16 93 % -  08/20/13 1000 115/45 mmHg - 109 - - -  Recent laboratory studies:  Recent Labs  08/19/13 1600 08/20/13 0755 08/21/13 0630  WBC 10.4 12.4* 10.1  HGB 6.6* 10.1* 10.4*  HCT 21.5* 31.2* 31.2*  PLT 225 216 247  NA 130*  --   --   K 3.6  --   --   CL 97  --   --   CO2 24  --   --   BUN 25*  --   --   CREATININE 0.59  --   --   GLUCOSE 122*  --   --   CALCIUM 7.2*  --   --      Discharge Medications:     Medication List    STOP taking these medications       amoxicillin 500 MG capsule  Commonly known as:  AMOXIL     traMADol 50 MG tablet  Commonly known as:  ULTRAM      TAKE these medications       albuterol 108 (90 BASE) MCG/ACT inhaler  Commonly known as:  PROVENTIL HFA;VENTOLIN HFA  Inhale 2 puffs into the lungs as needed for wheezing or shortness of breath.     aspirin 325 MG EC  tablet  Take 1 tablet (325 mg total) by mouth 2 (two) times daily after a meal.     CARAFATE 1 GM/10ML suspension  Generic drug:  sucralfate  Take 1 g by mouth 2 (two) times daily as needed (stomach upset).     celecoxib 200 MG capsule  Commonly known as:  CELEBREX  Take 200 mg by mouth 2 (two) times daily.     cloNIDine 0.1 MG tablet  Commonly known as:  CATAPRES  Take 0.1 mg by mouth daily.     cyclobenzaprine 10 MG tablet  Commonly known as:  FLEXERIL  Take 10 mg by mouth at bedtime as needed for muscle spasms. May also take 3 x a week if needed.     FLUoxetine 40 MG capsule  Commonly known as:  PROZAC  Take 40 mg by mouth 2 (two) times daily.     fluticasone-salmeterol 115-21 MCG/ACT inhaler  Commonly known as:  ADVAIR HFA  Inhale 1 puff into the lungs 2 (two) times daily.     gabapentin 300 MG capsule  Commonly known as:  NEURONTIN  Take 300 mg by mouth at bedtime.     guaifenesin 100 MG/5ML syrup  Commonly known as:  ROBITUSSIN  Take 300 mg by mouth 4 (four) times daily as needed for cough.     methylphenidate 10 MG tablet  Commonly known as:  RITALIN  Take 10 mg by mouth 2 (two) times daily.     metoprolol tartrate 25 MG tablet  Commonly known as:  LOPRESSOR  Take 25 mg by mouth 1 day or 1 dose.     nystatin 100000 UNIT/ML suspension  Commonly known as:  MYCOSTATIN  Use as directed 5 mLs in the mouth or throat 4 (four) times daily as needed (tongue hurts).     nystatin cream  Commonly known as:  MYCOSTATIN  Apply 1 application topically 3 (three) times daily as needed for dry skin.     omeprazole 20 MG capsule  Commonly known as:  PRILOSEC  Take 20 mg by mouth daily.     oxyCODONE 5 MG immediate release tablet  Commonly known as:  Oxy IR/ROXICODONE  Take 0.5-2 tablets (2.5-10 mg total) by mouth daily.     ramipril 5 MG capsule  Commonly known as:  ALTACE  Take 5 mg by mouth daily.     Vitamin D (Ergocalciferol) 50000 UNITS Caps capsule  Commonly  known as:  DRISDOL  Take 50,000 Units by mouth every 7 (seven) days. Wednesday        Diagnostic Studies: Dg Hip Operative Left  08/18/2013   CLINICAL DATA:  Left hip replacement.  EXAM: OPERATIVE LEFT HIP  COMPARISON:  None.  FINDINGS: Changes of left hip replacement. No hardware or bony complicating feature visualized. Normal AP alignment.  IMPRESSION: Left hip replacement.  No visible complicating feature.   Electronically Signed   By: Charlett Nose M.D.   On: 08/18/2013 16:55   Dg Pelvis Portable  08/18/2013   CLINICAL DATA:  Postop  EXAM: PORTABLE PELVIS 1-2 VIEWS  COMPARISON:  None.  FINDINGS: There is no fracture or dislocation. There is a left hip arthroplasty without failure or complication. There are postsurgical changes in the surrounding soft tissues.  There are severe degenerative changes of the right hip.  There are degenerative changes of the lower lumbar spine.  IMPRESSION: Left total hip arthroplasty.  Severe right hip osteoarthritic changes.   Electronically Signed   By: Elige Ko   On: 08/18/2013 15:18   Dg Chest Port 1 View  08/19/2013   CLINICAL DATA:  Port-A-Cath placement  EXAM: PORTABLE CHEST - 1 VIEW  COMPARISON:  Chest radiograph Jan 31, 2012 and chest CT December 24, 2012  FINDINGS: Port-A-Cath tip is at the cavoatrial junction. No pneumothorax. There is patchy bibasilar atelectasis. Lungs are otherwise clear. Heart size and pulmonary vascular normal. There is a focal hiatal hernia. There are healed rib fractures on the right.  IMPRESSION: Port-A-Cath tip at cavoatrial junction. No pneumothorax. Bibasilar atelectasis. Hiatal hernia present.   Electronically Signed   By: Bretta Bang M.D.   On: 08/19/2013 07:57   Dg Hip Portable 1 View Left  08/18/2013   CLINICAL DATA:  Left hip arthroplasty.  EXAM: PORTABLE LEFT HIP - 1 VIEW  COMPARISON:  None.  FINDINGS: Cross-table lateral view of the left hip demonstrates a left hip arthroplasty without dislocation. Postsurgical  changes in the surrounding soft tissues are noted.  IMPRESSION: Left total hip arthroplasty.   Electronically Signed   By: Elige Ko   On: 08/18/2013 15:18    Disposition:  To home      Discharge Orders   Future Appointments Provider Department Dept Phone   09/10/2013 3:00 PM Louis Meckel, MD [facility] [phone]   Future Orders Complete By Expires   Call MD / Call 911  As directed    Comments:     If you experience chest pain or shortness of breath, CALL 911 and be transported to the hospital emergency room.  If you develope a fever above 101 F, pus (white drainage) or increased drainage or redness at the wound, or calf pain, call your surgeon's office.   Constipation Prevention  As directed    Comments:     Drink plenty of fluids.  Prune juice may be helpful.  You may use a stool softener, such as Colace (over the counter) 100 mg twice a day.  Use MiraLax (over the counter) for constipation as needed.   Diet - low sodium heart healthy  As directed    Discharge patient  As directed    Discharge wound care:  As directed    Comments:     May shower with dressing intact.Keep dressing cleanand intact until Monday, then  remove and shower. Apply clean dressing after showering.   Increase activity slowly as tolerated  As directed    Weight bearing as tolerated  As directed    Questions:     Laterality:     Extremity:        Follow-up Information   Follow up with Kathryne Hitch, MD. Schedule an appointment as soon as possible for a visit in 2 weeks.   Specialty:  Orthopedic Surgery   Contact information:   [address] [place] [address] [phone]        Signed: Kathryne Hitch 08/21/2013, 7:07 AM

## 2013-08-21 NOTE — Progress Notes (Signed)
Physical Therapy Treatment Patient Details Name: Stacey Bowman MRN: [id number] DOB: 1940-11-08 Today's Date: 08/21/2013 Time: 4098-1191 PT Time Calculation (min): 23 min  PT Assessment / Plan / Recommendation  History of Present Illness Pt s/p L THA due to arthritis, H/O DDD, emphysema   PT Comments   Pt moving well but does present with decreased activity tolerance.  Pt safe to d/c home from mobility standpoint.     Follow Up Recommendations  Home health PT;Other (comment)     Does the patient have the potential to tolerate intense rehabilitation     Barriers to Discharge        Equipment Recommendations  None recommended by PT    Recommendations for Other Services    Frequency 7X/week   Progress towards PT Goals Progress towards PT goals: Progressing toward goals  Plan Current plan remains appropriate    Precautions / Restrictions Precautions Precautions: Fall Restrictions LLE Weight Bearing: Weight bearing as tolerated        Mobility  Bed Mobility Bed Mobility: Supine to Sit;Sitting - Scoot to Edge of Bed Supine to Sit: 6: Modified independent (Device/Increase time) Sitting - Scoot to Edge of Bed: 6: Modified independent (Device/Increase time) Transfers Transfers: Sit to Stand;Stand to Sit Sit to Stand: 6: Modified independent (Device/Increase time);With upper extremity assist;From bed;With armrests;From chair/3-in-1 Stand to Sit: 6: Modified independent (Device/Increase time);With upper extremity assist;With armrests;To chair/3-in-1 Ambulation/Gait Ambulation/Gait Assistance: 5: Supervision Ambulation Distance (Feet): 50 Feet Assistive device: Rolling walker Ambulation/Gait Assistance Details: Required 4 seated rest breaks to complete distance due to fatigue.  Min cueing for step sequencing when pt gets distracted as she's talking.  Gait Pattern: Step-to pattern General Gait Details: fatigues quickly Stairs: No Wheelchair Mobility Wheelchair Mobility:  No      PT Goals (current goals can now be found in the care plan section) Acute Rehab PT Goals Patient Stated Goal: To go home and to get stronger.  Pt does not want to go to rehab. PT Goal Formulation: With patient Time For Goal Achievement: 08/26/13 Potential to Achieve Goals: Good  Visit Information  Last PT Received On: 08/21/13 Assistance Needed: +1 History of Present Illness: Pt s/p L THA due to arthritis, H/O DDD, emphysema    Subjective Data  Subjective: "im so surprised at how the incision doesnt hurt" Patient Stated Goal: To go home and to get stronger.  Pt does not want to go to rehab.   Cognition  Cognition Arousal/Alertness: Awake/alert Behavior During Therapy: WFL for tasks assessed/performed;Anxious Overall Cognitive Status: Within Functional Limits for tasks assessed    Balance     End of Session PT - End of Session Activity Tolerance: Patient tolerated treatment well Patient left: in chair;with call bell/phone within reach Nurse Communication: Mobility status   GP     Lara Mulch 08/21/2013, 8:58 AM  Verdell Face, PTA [phone] 08/21/2013

## 2013-08-21 NOTE — Progress Notes (Signed)
Patient ID: Stacey Bowman, female   DOB: 02-Jan-1941, 72 y.o.   MRN: [id number] Doing well.  Mobilizing well with PT.  Can discharge to home today.  Of note, she did NOT have acute blood loss anemia from surgery.  This was a bad lab value (hgb 6.6).  Her vitals were normal, she had low blood loss from surgery, and received tranexamic acid in the OR.  Although she did get 2 units of blood due to this value, her post-transfusion hgb was too high a response to be from 2 units of blood.  Thus, I feel strongly that she did not have acute blood loss anemia from the surgery.  Here hgb today is already at her baseline of the chronic anemia she already had pre-op.

## 2013-09-10 ENCOUNTER — Encounter: Payer: Medicare Other | Admitting: Gastroenterology

## 2013-09-21 ENCOUNTER — Other Ambulatory Visit (HOSPITAL_COMMUNITY): Payer: Self-pay | Admitting: Orthopaedic Surgery

## 2013-09-30 ENCOUNTER — Encounter (HOSPITAL_COMMUNITY)
Admission: RE | Admit: 2013-09-30 | Discharge: 2013-09-30 | Disposition: A | Discharge status: Home / Self Care | Payer: Medicare Other | Source: Ambulatory Visit | Attending: Orthopaedic Surgery | Admitting: Orthopaedic Surgery

## 2013-09-30 ENCOUNTER — Encounter (HOSPITAL_COMMUNITY): Payer: Self-pay

## 2013-09-30 DIAGNOSIS — Z01818 Encounter for other preprocedural examination: Secondary | ICD-10-CM | POA: Insufficient documentation

## 2013-09-30 DIAGNOSIS — Z01812 Encounter for preprocedural laboratory examination: Secondary | ICD-10-CM | POA: Insufficient documentation

## 2013-09-30 HISTORY — DX: Personal history of other medical treatment: Z92.89

## 2013-09-30 HISTORY — DX: Headache: R51

## 2013-09-30 HISTORY — DX: Dependence on supplemental oxygen: Z99.81

## 2013-09-30 LAB — BASIC METABOLIC PANEL
BUN: 21 mg/dL (ref 6–23)
CHLORIDE: 102 meq/L (ref 96–112)
CO2: 24 mEq/L (ref 19–32)
CREATININE: 0.58 mg/dL (ref 0.50–1.10)
Calcium: 8.9 mg/dL (ref 8.4–10.5)
GFR calc Af Amer: >90 mL/min (ref >90)
GFR calc non Af Amer: >90 mL/min (ref >90)
Glucose, Bld: 110 mg/dL — ABNORMAL HIGH (ref 70–99)
POTASSIUM: 4.1 meq/L (ref 3.7–5.3)
Sodium: 141 mEq/L (ref 137–147)

## 2013-09-30 LAB — URINALYSIS, ROUTINE W REFLEX MICROSCOPIC
BILIRUBIN URINE: NEGATIVE
GLUCOSE, UA: NEGATIVE mg/dL
HGB URINE DIPSTICK: NEGATIVE
Ketones, ur: NEGATIVE mg/dL
Leukocytes, UA: NEGATIVE
Nitrite: NEGATIVE
PH: 6.5 (ref 5.0–8.0)
Protein, ur: NEGATIVE mg/dL
SPECIFIC GRAVITY, URINE: 1.006 (ref 1.005–1.030)
UROBILINOGEN UA: 0.2 mg/dL (ref 0.0–1.0)

## 2013-09-30 LAB — APTT: APTT: 32 s (ref 24–37)

## 2013-09-30 LAB — CBC
HCT: 35.3 % — ABNORMAL LOW (ref 36.0–46.0)
HEMOGLOBIN: 11.5 g/dL — AB (ref 12.0–15.0)
MCH: 27.6 pg (ref 26.0–34.0)
MCHC: 32.6 g/dL (ref 30.0–36.0)
MCV: 84.9 fL (ref 78.0–100.0)
Platelets: 304 10*3/uL (ref 150–400)
RBC: 4.16 MIL/uL (ref 3.87–5.11)
RDW: 19 % — AB (ref 11.5–15.5)
WBC: 8 10*3/uL (ref 4.0–10.5)

## 2013-09-30 LAB — PROTIME-INR
INR: 1.03 (ref 0.00–1.49)
Prothrombin Time: 13.3 seconds (ref 11.6–15.2)

## 2013-09-30 LAB — SURGICAL PCR SCREEN
MRSA, PCR: NEGATIVE
Staphylococcus aureus: NEGATIVE

## 2013-09-30 MED ORDER — HEPARIN SOD (PORK) LOCK FLUSH 100 UNIT/ML IV SOLN
INTRAVENOUS | Status: AC
  Filled 2013-09-30: qty 5

## 2013-09-30 NOTE — Progress Notes (Signed)
Patient 's PCP is Dustin Flock, [place] at [facility] in [place].  I faxed a request there requesting Chest X ray report and last office note.

## 2013-09-30 NOTE — Pre-Procedure Instructions (Addendum)
Stacey Bowman  09/30/2013   Your procedure is scheduled on:  Tuesday, February 3.  Report to [facility], Main Entrance [name]"at 1:25 AM.  Call this number if you have problems the morning of surgery: [phone]   Remember:   Do not eat food or drink liquids after midnight Monday.    Take these medicines the morning of surgery with A SIP OF WATER: cloNIDine (CATAPRES),FLUoxetine (PROZAC), metoprolol tartrate (LOPRESSOR),gabapentin (NEURONTIN) omeprazole (PRILOSEC). May use inhalers, bring Albuterol inhaler to the hospital with you.  Take if needed:oxyCODONE (OXY IR/ROXICODONE),guaifenesin (ROBITUSSIN),cyclobenzaprine (FLEXERIL),  benzonatate (TESSALON), docusate sodium (COLACE), diphenhydrAMINE (BENADRYL), gabapentin (NEURONTIN), Compazine or Phenergan.  Stop taking celecoxib (CELEBREX).   Do not wear jewelry, make-up or nail polish.  Do not wear lotions, powders, or perfume.  Do not shave 48 hours prior to surgery.   Do not bring valuables to the hospital.  [facility] is not responsible  for any belongings or valuables.               Contacts, dentures or bridgework may not be worn into surgery.  Leave suitcase in the car. After surgery it may be brought to your room.  For patients admitted to the hospital, discharge time is determined by your treatment team.            Special Instructions: Shower using CHG 2 nights before surgery and the night before surgery.  If you shower the day of surgery use CHG.  Use special wash - you have one bottle of CHG for all showers.  You should use approximately 1/3 of the bottle for each shower.   Please read over the following fact sheets that you were given: Pain Booklet, Coughing and Deep Breathing, Blood Transfusion Information and Surgical Site Infection Prevention

## 2013-10-01 NOTE — Progress Notes (Signed)
Per Ebony Hail, [place] patient will not need CXR d/t recent CT scan

## 2013-10-05 MED ORDER — CEFAZOLIN SODIUM-DEXTROSE 2-3 GM-% IV SOLR
2.0000 g | INTRAVENOUS | Status: AC
  Administered 2013-10-06: 2 g via INTRAVENOUS
  Filled 2013-10-05: qty 50

## 2013-10-05 NOTE — Progress Notes (Signed)
Had to leave voice mail notifying patient of new arrival time of 12:15pm..Marland KitchenMarland Kitchen

## 2013-10-06 ENCOUNTER — Inpatient Hospital Stay (HOSPITAL_COMMUNITY): Payer: Medicare Other | Admitting: Certified Registered"

## 2013-10-06 ENCOUNTER — Inpatient Hospital Stay (HOSPITAL_COMMUNITY): Payer: Medicare Other

## 2013-10-06 ENCOUNTER — Encounter (HOSPITAL_COMMUNITY): Payer: Self-pay | Admitting: Certified Registered"

## 2013-10-06 ENCOUNTER — Encounter (HOSPITAL_COMMUNITY): Payer: Medicare Other | Admitting: Certified Registered"

## 2013-10-06 ENCOUNTER — Encounter (HOSPITAL_COMMUNITY)
Admission: RE | Disposition: A | Discharge status: Home Health | Payer: Self-pay | Source: Ambulatory Visit | Attending: Orthopaedic Surgery

## 2013-10-06 ENCOUNTER — Inpatient Hospital Stay (HOSPITAL_COMMUNITY)
Admission: RE | Admit: 2013-10-06 | Discharge: 2013-10-10 | DRG: 470 | Disposition: A | Discharge status: Home Health | Payer: Medicare Other | Source: Ambulatory Visit | Attending: Orthopaedic Surgery | Admitting: Orthopaedic Surgery

## 2013-10-06 DIAGNOSIS — M1611 Unilateral primary osteoarthritis, right hip: Secondary | ICD-10-CM

## 2013-10-06 DIAGNOSIS — M1610 Unilateral primary osteoarthritis, unspecified hip: Principal | ICD-10-CM | POA: Diagnosis present

## 2013-10-06 DIAGNOSIS — D62 Acute posthemorrhagic anemia: Secondary | ICD-10-CM | POA: Diagnosis not present

## 2013-10-06 DIAGNOSIS — F429 Obsessive-compulsive disorder, unspecified: Secondary | ICD-10-CM | POA: Diagnosis present

## 2013-10-06 DIAGNOSIS — Z860100 Personal history of colon polyps, unspecified: Secondary | ICD-10-CM

## 2013-10-06 DIAGNOSIS — Z8249 Family history of ischemic heart disease and other diseases of the circulatory system: Secondary | ICD-10-CM

## 2013-10-06 DIAGNOSIS — D509 Iron deficiency anemia, unspecified: Secondary | ICD-10-CM | POA: Diagnosis present

## 2013-10-06 DIAGNOSIS — I10 Essential (primary) hypertension: Secondary | ICD-10-CM | POA: Diagnosis present

## 2013-10-06 DIAGNOSIS — Z87891 Personal history of nicotine dependence: Secondary | ICD-10-CM

## 2013-10-06 DIAGNOSIS — J4489 Other specified chronic obstructive pulmonary disease: Secondary | ICD-10-CM | POA: Diagnosis present

## 2013-10-06 DIAGNOSIS — Z96649 Presence of unspecified artificial hip joint: Secondary | ICD-10-CM

## 2013-10-06 DIAGNOSIS — Z79899 Other long term (current) drug therapy: Secondary | ICD-10-CM

## 2013-10-06 DIAGNOSIS — Z8619 Personal history of other infectious and parasitic diseases: Secondary | ICD-10-CM

## 2013-10-06 DIAGNOSIS — F988 Other specified behavioral and emotional disorders with onset usually occurring in childhood and adolescence: Secondary | ICD-10-CM | POA: Diagnosis present

## 2013-10-06 DIAGNOSIS — F329 Major depressive disorder, single episode, unspecified: Secondary | ICD-10-CM | POA: Diagnosis present

## 2013-10-06 DIAGNOSIS — J449 Chronic obstructive pulmonary disease, unspecified: Secondary | ICD-10-CM | POA: Diagnosis present

## 2013-10-06 DIAGNOSIS — Z833 Family history of diabetes mellitus: Secondary | ICD-10-CM

## 2013-10-06 DIAGNOSIS — Z7982 Long term (current) use of aspirin: Secondary | ICD-10-CM

## 2013-10-06 DIAGNOSIS — Z836 Family history of other diseases of the respiratory system: Secondary | ICD-10-CM

## 2013-10-06 DIAGNOSIS — Z8542 Personal history of malignant neoplasm of other parts of uterus: Secondary | ICD-10-CM

## 2013-10-06 DIAGNOSIS — M169 Osteoarthritis of hip, unspecified: Principal | ICD-10-CM | POA: Diagnosis present

## 2013-10-06 DIAGNOSIS — F3289 Other specified depressive episodes: Secondary | ICD-10-CM | POA: Diagnosis present

## 2013-10-06 DIAGNOSIS — K219 Gastro-esophageal reflux disease without esophagitis: Secondary | ICD-10-CM | POA: Diagnosis present

## 2013-10-06 DIAGNOSIS — M810 Age-related osteoporosis without current pathological fracture: Secondary | ICD-10-CM | POA: Diagnosis present

## 2013-10-06 DIAGNOSIS — Z9089 Acquired absence of other organs: Secondary | ICD-10-CM

## 2013-10-06 DIAGNOSIS — Z8049 Family history of malignant neoplasm of other genital organs: Secondary | ICD-10-CM

## 2013-10-06 HISTORY — PX: TOTAL HIP ARTHROPLASTY: SHX124

## 2013-10-06 SURGERY — ARTHROPLASTY, HIP, TOTAL, ANTERIOR APPROACH
Anesthesia: General | Site: Hip | Laterality: Right

## 2013-10-06 MED ORDER — DOCUSATE SODIUM 100 MG PO CAPS: 100.0000 mg | ORAL_CAPSULE | Freq: Every day | ORAL | Status: DC | PRN

## 2013-10-06 MED ORDER — POLYETHYLENE GLYCOL 3350 17 G PO PACK
17.0000 g | PACK | Freq: Every day | ORAL | Status: DC | PRN
  Administered 2013-10-09: 17 g via ORAL
  Filled 2013-10-06 (×3): qty 1

## 2013-10-06 MED ORDER — PROPOFOL 10 MG/ML IV BOLUS
INTRAVENOUS | Status: DC | PRN
  Administered 2013-10-06: 100 mg via INTRAVENOUS
  Administered 2013-10-06 (×2): 20 mg via INTRAVENOUS

## 2013-10-06 MED ORDER — SUCRALFATE 1 GM/10ML PO SUSP
2.0000 g | Freq: Every day | ORAL | Status: DC
  Administered 2013-10-06 – 2013-10-10 (×5): 2 g via ORAL
  Filled 2013-10-06 (×5): qty 20

## 2013-10-06 MED ORDER — PHENYLEPHRINE HCL 10 MG/ML IJ SOLN
INTRAMUSCULAR | Status: DC | PRN
  Administered 2013-10-06: 160 ug via INTRAVENOUS
  Administered 2013-10-06 (×2): 80 ug via INTRAVENOUS

## 2013-10-06 MED ORDER — ONDANSETRON HCL 4 MG/2ML IJ SOLN: 4.0000 mg | Freq: Once | INTRAMUSCULAR | Status: DC | PRN

## 2013-10-06 MED ORDER — PANTOPRAZOLE SODIUM 40 MG PO TBEC
40.0000 mg | DELAYED_RELEASE_TABLET | Freq: Every day | ORAL | Status: DC
  Administered 2013-10-07 – 2013-10-10 (×4): 40 mg via ORAL
  Filled 2013-10-06 (×4): qty 1

## 2013-10-06 MED ORDER — SODIUM CHLORIDE 0.9 % IR SOLN
Status: DC | PRN
  Administered 2013-10-06: 3000 mL

## 2013-10-06 MED ORDER — CEFAZOLIN SODIUM 1-5 GM-% IV SOLN
1.0000 g | Freq: Four times a day (QID) | INTRAVENOUS | Status: AC
  Administered 2013-10-06 – 2013-10-07 (×2): 1 g via INTRAVENOUS
  Filled 2013-10-06 (×2): qty 50

## 2013-10-06 MED ORDER — ASPIRIN EC 325 MG PO TBEC: 325.0000 mg | DELAYED_RELEASE_TABLET | Freq: Every day | ORAL | Status: DC

## 2013-10-06 MED ORDER — METOCLOPRAMIDE HCL 10 MG PO TABS
5.0000 mg | ORAL_TABLET | Freq: Three times a day (TID) | ORAL | Status: DC | PRN
  Administered 2013-10-10: 10 mg via ORAL
  Filled 2013-10-06: qty 1

## 2013-10-06 MED ORDER — CLONIDINE HCL 0.1 MG PO TABS
0.1000 mg | ORAL_TABLET | Freq: Every day | ORAL | Status: DC
Start: 2013-10-07 — End: 2013-10-10
  Administered 2013-10-07 – 2013-10-10 (×4): 0.1 mg via ORAL
  Filled 2013-10-06 (×4): qty 1

## 2013-10-06 MED ORDER — ONDANSETRON HCL 4 MG PO TABS
4.0000 mg | ORAL_TABLET | Freq: Four times a day (QID) | ORAL | Status: DC | PRN
  Administered 2013-10-10: 4 mg via ORAL
  Filled 2013-10-06: qty 1

## 2013-10-06 MED ORDER — METHOCARBAMOL 100 MG/ML IJ SOLN
500.0000 mg | INTRAVENOUS | Status: AC
  Filled 2013-10-06: qty 5

## 2013-10-06 MED ORDER — FLUTICASONE PROPIONATE 50 MCG/ACT NA SUSP
1.0000 | Freq: Every day | NASAL | Status: DC | PRN
  Filled 2013-10-06: qty 16

## 2013-10-06 MED ORDER — NEOSTIGMINE METHYLSULFATE 1 MG/ML IJ SOLN
INTRAMUSCULAR | Status: DC | PRN
  Administered 2013-10-06: 3 mg via INTRAVENOUS

## 2013-10-06 MED ORDER — METOPROLOL TARTRATE 25 MG PO TABS
25.0000 mg | ORAL_TABLET | Freq: Every day | ORAL | Status: DC
  Administered 2013-10-07 – 2013-10-10 (×4): 25 mg via ORAL
  Filled 2013-10-06 (×4): qty 1

## 2013-10-06 MED ORDER — METHOCARBAMOL 100 MG/ML IJ SOLN
500.0000 mg | Freq: Four times a day (QID) | INTRAVENOUS | Status: DC | PRN
  Filled 2013-10-06: qty 5

## 2013-10-06 MED ORDER — 0.9 % SODIUM CHLORIDE (POUR BTL) OPTIME
TOPICAL | Status: DC | PRN
  Administered 2013-10-06: 1000 mL

## 2013-10-06 MED ORDER — FLUOXETINE HCL 20 MG PO CAPS
40.0000 mg | ORAL_CAPSULE | Freq: Every day | ORAL | Status: DC
  Administered 2013-10-07 – 2013-10-10 (×4): 40 mg via ORAL
  Filled 2013-10-06 (×4): qty 2

## 2013-10-06 MED ORDER — PHENYLEPHRINE 40 MCG/ML (10ML) SYRINGE FOR IV PUSH (FOR BLOOD PRESSURE SUPPORT)
PREFILLED_SYRINGE | INTRAVENOUS | Status: AC
  Filled 2013-10-06: qty 10

## 2013-10-06 MED ORDER — SODIUM CHLORIDE 0.9 % IV SOLN
INTRAVENOUS | Status: DC
  Administered 2013-10-06 (×2): via INTRAVENOUS

## 2013-10-06 MED ORDER — RAMIPRIL 5 MG PO CAPS
5.0000 mg | ORAL_CAPSULE | Freq: Every day | ORAL | Status: DC
  Administered 2013-10-07 – 2013-10-10 (×4): 5 mg via ORAL
  Filled 2013-10-06 (×4): qty 1

## 2013-10-06 MED ORDER — METHYLPHENIDATE HCL 5 MG PO TABS
10.0000 mg | ORAL_TABLET | Freq: Two times a day (BID) | ORAL | Status: DC
  Administered 2013-10-07 – 2013-10-10 (×6): 10 mg via ORAL
  Filled 2013-10-06 (×7): qty 2

## 2013-10-06 MED ORDER — LACTATED RINGERS IV SOLN
INTRAVENOUS | Status: DC
  Administered 2013-10-06 (×2): via INTRAVENOUS

## 2013-10-06 MED ORDER — PHENOL 1.4 % MT LIQD
1.0000 | OROMUCOSAL | Status: DC | PRN
  Filled 2013-10-06: qty 177

## 2013-10-06 MED ORDER — METHOCARBAMOL 500 MG PO TABS
500.0000 mg | ORAL_TABLET | Freq: Four times a day (QID) | ORAL | Status: DC | PRN
  Administered 2013-10-06 – 2013-10-09 (×6): 500 mg via ORAL
  Filled 2013-10-06 (×5): qty 1

## 2013-10-06 MED ORDER — TRANEXAMIC ACID 100 MG/ML IV SOLN
1000.0000 mg | INTRAVENOUS | Status: AC
  Administered 2013-10-06: 1000 mg via INTRAVENOUS
  Filled 2013-10-06: qty 10

## 2013-10-06 MED ORDER — LIDOCAINE HCL (CARDIAC) 20 MG/ML IV SOLN
INTRAVENOUS | Status: DC | PRN
  Administered 2013-10-06: 80 mg via INTRAVENOUS

## 2013-10-06 MED ORDER — GABAPENTIN 300 MG PO CAPS
900.0000 mg | ORAL_CAPSULE | Freq: Three times a day (TID) | ORAL | Status: DC
  Administered 2013-10-06 – 2013-10-10 (×11): 900 mg via ORAL
  Filled 2013-10-06 (×13): qty 3

## 2013-10-06 MED ORDER — ALBUTEROL SULFATE (2.5 MG/3ML) 0.083% IN NEBU
3.0000 mL | INHALATION_SOLUTION | RESPIRATORY_TRACT | Status: DC | PRN
  Administered 2013-10-09 – 2013-10-10 (×2): 3 mL via RESPIRATORY_TRACT
  Filled 2013-10-06 (×2): qty 3

## 2013-10-06 MED ORDER — ACETAMINOPHEN 650 MG RE SUPP: 650.0000 mg | Freq: Four times a day (QID) | RECTAL | Status: DC | PRN

## 2013-10-06 MED ORDER — ALUM & MAG HYDROXIDE-SIMETH 200-200-20 MG/5ML PO SUSP: 30.0000 mL | ORAL | Status: DC | PRN

## 2013-10-06 MED ORDER — NYSTATIN 100000 UNIT/ML MT SUSP
5.0000 mL | Freq: Three times a day (TID) | OROMUCOSAL | Status: DC
  Administered 2013-10-06 – 2013-10-10 (×11): 500000 [IU] via OROMUCOSAL
  Filled 2013-10-06 (×13): qty 5

## 2013-10-06 MED ORDER — LIDOCAINE HCL (CARDIAC) 20 MG/ML IV SOLN
INTRAVENOUS | Status: AC
  Filled 2013-10-06: qty 5

## 2013-10-06 MED ORDER — ZOLPIDEM TARTRATE 5 MG PO TABS: 5.0000 mg | ORAL_TABLET | Freq: Every evening | ORAL | Status: DC | PRN

## 2013-10-06 MED ORDER — ACETAMINOPHEN 325 MG PO TABS: 650.0000 mg | ORAL_TABLET | Freq: Four times a day (QID) | ORAL | Status: DC | PRN

## 2013-10-06 MED ORDER — METOCLOPRAMIDE HCL 5 MG/ML IJ SOLN: 5.0000 mg | Freq: Three times a day (TID) | INTRAMUSCULAR | Status: DC | PRN

## 2013-10-06 MED ORDER — ONDANSETRON HCL 4 MG/2ML IJ SOLN
INTRAMUSCULAR | Status: AC
  Filled 2013-10-06: qty 2

## 2013-10-06 MED ORDER — ROCURONIUM BROMIDE 50 MG/5ML IV SOLN
INTRAVENOUS | Status: AC
  Filled 2013-10-06: qty 1

## 2013-10-06 MED ORDER — OXYCODONE HCL 5 MG PO TABS
5.0000 mg | ORAL_TABLET | ORAL | Status: DC | PRN
  Administered 2013-10-06: 10 mg via ORAL
  Administered 2013-10-06: 5 mg via ORAL
  Administered 2013-10-09: 10 mg via ORAL
  Administered 2013-10-09: 5 mg via ORAL
  Administered 2013-10-10 (×2): 10 mg via ORAL
  Filled 2013-10-06 (×5): qty 2
  Filled 2013-10-06: qty 1

## 2013-10-06 MED ORDER — HYDROMORPHONE HCL PF 1 MG/ML IJ SOLN
1.0000 mg | INTRAMUSCULAR | Status: DC | PRN
  Administered 2013-10-06 – 2013-10-07 (×3): 1 mg via INTRAVENOUS
  Filled 2013-10-06 (×3): qty 1

## 2013-10-06 MED ORDER — MOMETASONE FURO-FORMOTEROL FUM 100-5 MCG/ACT IN AERO
2.0000 | INHALATION_SPRAY | Freq: Two times a day (BID) | RESPIRATORY_TRACT | Status: DC
Start: 2013-10-06 — End: 2013-10-10
  Administered 2013-10-07 – 2013-10-10 (×3): 2 via RESPIRATORY_TRACT
  Filled 2013-10-06: qty 8.8

## 2013-10-06 MED ORDER — TRAMADOL HCL 50 MG PO TABS
100.0000 mg | ORAL_TABLET | Freq: Four times a day (QID) | ORAL | Status: DC | PRN
  Administered 2013-10-08: 100 mg via ORAL
  Filled 2013-10-06: qty 2

## 2013-10-06 MED ORDER — GLYCOPYRROLATE 0.2 MG/ML IJ SOLN
INTRAMUSCULAR | Status: AC
  Filled 2013-10-06: qty 2

## 2013-10-06 MED ORDER — OXYCODONE HCL 5 MG PO TABS
ORAL_TABLET | ORAL | Status: AC
  Filled 2013-10-06: qty 1

## 2013-10-06 MED ORDER — OXYCODONE HCL ER 10 MG PO T12A
10.0000 mg | EXTENDED_RELEASE_TABLET | Freq: Two times a day (BID) | ORAL | Status: DC
  Administered 2013-10-06 – 2013-10-10 (×7): 10 mg via ORAL
  Filled 2013-10-06 (×8): qty 1

## 2013-10-06 MED ORDER — METHOCARBAMOL 500 MG PO TABS
ORAL_TABLET | ORAL | Status: AC
  Filled 2013-10-06: qty 1

## 2013-10-06 MED ORDER — CHLORHEXIDINE GLUCONATE 4 % EX LIQD: 60.0000 mL | Freq: Once | CUTANEOUS | Status: DC

## 2013-10-06 MED ORDER — FENTANYL CITRATE 0.05 MG/ML IJ SOLN
INTRAMUSCULAR | Status: AC
  Filled 2013-10-06: qty 2

## 2013-10-06 MED ORDER — MENTHOL 3 MG MT LOZG: 1.0000 | LOZENGE | OROMUCOSAL | Status: DC | PRN

## 2013-10-06 MED ORDER — ONDANSETRON HCL 4 MG/2ML IJ SOLN
4.0000 mg | Freq: Four times a day (QID) | INTRAMUSCULAR | Status: DC | PRN
  Administered 2013-10-08: 4 mg via INTRAVENOUS
  Filled 2013-10-06: qty 2

## 2013-10-06 MED ORDER — FENTANYL CITRATE 0.05 MG/ML IJ SOLN
25.0000 ug | INTRAMUSCULAR | Status: DC | PRN
  Administered 2013-10-06: 25 ug via INTRAVENOUS
  Administered 2013-10-06: 50 ug via INTRAVENOUS
  Administered 2013-10-06: 25 ug via INTRAVENOUS

## 2013-10-06 MED ORDER — PROCHLORPERAZINE MALEATE 10 MG PO TABS
10.0000 mg | ORAL_TABLET | ORAL | Status: DC | PRN
  Filled 2013-10-06: qty 1

## 2013-10-06 MED ORDER — PROPOFOL 10 MG/ML IV BOLUS
INTRAVENOUS | Status: AC
  Filled 2013-10-06: qty 20

## 2013-10-06 MED ORDER — BENZONATATE 100 MG PO CAPS
100.0000 mg | ORAL_CAPSULE | Freq: Two times a day (BID) | ORAL | Status: DC | PRN
  Filled 2013-10-06: qty 1

## 2013-10-06 MED ORDER — MIDAZOLAM HCL 2 MG/2ML IJ SOLN
INTRAMUSCULAR | Status: AC
  Filled 2013-10-06: qty 2

## 2013-10-06 MED ORDER — ONDANSETRON HCL 4 MG/2ML IJ SOLN
INTRAMUSCULAR | Status: DC | PRN
  Administered 2013-10-06: 4 mg via INTRAVENOUS

## 2013-10-06 MED ORDER — GLYCOPYRROLATE 0.2 MG/ML IJ SOLN
INTRAMUSCULAR | Status: DC | PRN
  Administered 2013-10-06: 0.4 mg via INTRAVENOUS

## 2013-10-06 MED ORDER — ASPIRIN EC 325 MG PO TBEC
325.0000 mg | DELAYED_RELEASE_TABLET | Freq: Every day | ORAL | Status: DC
  Administered 2013-10-07 – 2013-10-10 (×4): 325 mg via ORAL
  Filled 2013-10-06 (×5): qty 1

## 2013-10-06 MED ORDER — FENTANYL CITRATE 0.05 MG/ML IJ SOLN
INTRAMUSCULAR | Status: AC
  Filled 2013-10-06: qty 5

## 2013-10-06 MED ORDER — ROCURONIUM BROMIDE 100 MG/10ML IV SOLN
INTRAVENOUS | Status: DC | PRN
  Administered 2013-10-06 (×2): 10 mg via INTRAVENOUS
  Administered 2013-10-06: 30 mg via INTRAVENOUS

## 2013-10-06 MED ORDER — MIDAZOLAM HCL 5 MG/5ML IJ SOLN
INTRAMUSCULAR | Status: DC | PRN
  Administered 2013-10-06: 2 mg via INTRAVENOUS

## 2013-10-06 MED ORDER — FENTANYL CITRATE 0.05 MG/ML IJ SOLN
INTRAMUSCULAR | Status: DC | PRN
  Administered 2013-10-06 (×2): 50 ug via INTRAVENOUS
  Administered 2013-10-06: 100 ug via INTRAVENOUS
  Administered 2013-10-06: 50 ug via INTRAVENOUS

## 2013-10-06 MED ORDER — NEOSTIGMINE METHYLSULFATE 1 MG/ML IJ SOLN
INTRAMUSCULAR | Status: AC
  Filled 2013-10-06: qty 10

## 2013-10-06 SURGICAL SUPPLY — 57 items
BANDAGE GAUZE ELAST BULKY 4 IN (GAUZE/BANDAGES/DRESSINGS) IMPLANT
BLADE SAW SGTL 18X1.27X75 (BLADE) ×2 IMPLANT
BLADE SAW SGTL 18X1.27X75MM (BLADE) ×1
BLADE SURG ROTATE 9660 (MISCELLANEOUS) IMPLANT
CAPT HIP PF COP ×3 IMPLANT
CELLS DAT CNTRL 66122 CELL SVR (MISCELLANEOUS) ×1 IMPLANT
CLOTH BEACON ORANGE TIMEOUT ST (SAFETY) ×3 IMPLANT
COVER BACK TABLE 24X17X13 BIG (DRAPES) IMPLANT
COVER SURGICAL LIGHT HANDLE (MISCELLANEOUS) ×3 IMPLANT
DERMABOND ADHESIVE PROPEN (GAUZE/BANDAGES/DRESSINGS) ×2
DERMABOND ADVANCED .7 DNX6 (GAUZE/BANDAGES/DRESSINGS) ×1 IMPLANT
DRAPE C-ARM 42X72 X-RAY (DRAPES) ×3 IMPLANT
DRAPE STERI IOBAN 125X83 (DRAPES) ×3 IMPLANT
DRAPE U-SHAPE 47X51 STRL (DRAPES) ×9 IMPLANT
DRSG AQUACEL AG ADV 3.5X10 (GAUZE/BANDAGES/DRESSINGS) ×3 IMPLANT
DRSG MEPILEX BORDER 4X8 (GAUZE/BANDAGES/DRESSINGS) ×3 IMPLANT
DURAPREP 26ML APPLICATOR (WOUND CARE) ×3 IMPLANT
ELECT BLADE 4.0 EZ CLEAN MEGAD (MISCELLANEOUS)
ELECT BLADE 6.5 EXT (BLADE) IMPLANT
ELECT BLADE TIP CTD 4 INCH (ELECTRODE) ×3 IMPLANT
ELECT CAUTERY BLADE 6.4 (BLADE) ×3 IMPLANT
ELECT REM PT RETURN 9FT ADLT (ELECTROSURGICAL) ×3
ELECTRODE BLDE 4.0 EZ CLN MEGD (MISCELLANEOUS) IMPLANT
ELECTRODE REM PT RTRN 9FT ADLT (ELECTROSURGICAL) ×1 IMPLANT
FACESHIELD LNG OPTICON STERILE (SAFETY) ×6 IMPLANT
GAUZE XEROFORM 1X8 LF (GAUZE/BANDAGES/DRESSINGS) ×3 IMPLANT
GLOVE BIOGEL PI IND STRL 8 (GLOVE) ×2 IMPLANT
GLOVE BIOGEL PI INDICATOR 8 (GLOVE) ×4
GLOVE ECLIPSE 8.0 STRL XLNG CF (GLOVE) ×3 IMPLANT
GLOVE ORTHO TXT STRL SZ7.5 (GLOVE) ×6 IMPLANT
GLOVE SURG ORTHO 8.0 STRL STRW (GLOVE) ×3 IMPLANT
GOWN STRL REIN XL XLG (GOWN DISPOSABLE) ×6 IMPLANT
HANDPIECE INTERPULSE COAX TIP (DISPOSABLE) ×2
KIT BASIN OR (CUSTOM PROCEDURE TRAY) ×3 IMPLANT
KIT ROOM TURNOVER OR (KITS) ×3 IMPLANT
MANIFOLD NEPTUNE II (INSTRUMENTS) ×3 IMPLANT
NS IRRIG 1000ML POUR BTL (IV SOLUTION) ×3 IMPLANT
PACK TOTAL JOINT (CUSTOM PROCEDURE TRAY) ×3 IMPLANT
PAD ARMBOARD 7.5X6 YLW CONV (MISCELLANEOUS) ×6 IMPLANT
RTRCTR WOUND ALEXIS 18CM MED (MISCELLANEOUS) ×3
SCREW 6.5MMX25MM (Screw) ×3 IMPLANT
SET HNDPC FAN SPRY TIP SCT (DISPOSABLE) ×1 IMPLANT
SPONGE LAP 18X18 X RAY DECT (DISPOSABLE) IMPLANT
SPONGE LAP 4X18 X RAY DECT (DISPOSABLE) IMPLANT
STAPLER VISISTAT 35W (STAPLE) ×3 IMPLANT
SUT ETHIBOND NAB CT1 #1 30IN (SUTURE) ×6 IMPLANT
SUT MNCRL AB 3-0 PS2 27 (SUTURE) ×3 IMPLANT
SUT VIC AB 0 CT1 27 (SUTURE) ×4
SUT VIC AB 0 CT1 27XBRD ANBCTR (SUTURE) ×2 IMPLANT
SUT VIC AB 1 CT1 27 (SUTURE) ×4
SUT VIC AB 1 CT1 27XBRD ANBCTR (SUTURE) ×2 IMPLANT
SUT VIC AB 2-0 CT1 27 (SUTURE) ×4
SUT VIC AB 2-0 CT1 TAPERPNT 27 (SUTURE) ×2 IMPLANT
TOWEL OR 17X24 6PK STRL BLUE (TOWEL DISPOSABLE) ×3 IMPLANT
TOWEL OR 17X26 10 PK STRL BLUE (TOWEL DISPOSABLE) ×3 IMPLANT
TRAY FOLEY CATH 16FRSI W/METER (SET/KITS/TRAYS/PACK) IMPLANT
WATER STERILE IRR 1000ML POUR (IV SOLUTION) ×6 IMPLANT

## 2013-10-06 NOTE — Anesthesia Preprocedure Evaluation (Signed)
Anesthesia Evaluation  Patient identified by MRN, date of birth, ID band Patient awake    Reviewed: Allergy & Precautions, H&P , NPO status , Patient's Chart, lab work & pertinent test results  Airway Mallampati: II  Neck ROM: full    Dental   Pulmonary shortness of breath, COPDformer smoker,          Cardiovascular hypertension, + dysrhythmias     Neuro/Psych  Headaches, Depression    GI/Hepatic GERD-  ,  Endo/Other    Renal/GU      Musculoskeletal   Abdominal   Peds  Hematology   Anesthesia Other Findings   Reproductive/Obstetrics                           Anesthesia Physical Anesthesia Plan  ASA: III  Anesthesia Plan: General   Post-op Pain Management:    Induction: Intravenous  Airway Management Planned: Oral ETT  Additional Equipment:   Intra-op Plan:   Post-operative Plan: Extubation in OR  Informed Consent: I have reviewed the patients History and Physical, chart, labs and discussed the procedure including the risks, benefits and alternatives for the proposed anesthesia with the patient or authorized representative who has indicated his/her understanding and acceptance.     Plan Discussed with: CRNA, Anesthesiologist and Surgeon  Anesthesia Plan Comments:         Anesthesia Quick Evaluation

## 2013-10-06 NOTE — Anesthesia Postprocedure Evaluation (Signed)
Anesthesia Post Note  Patient: Stacey Bowman  Procedure(s) Performed: Procedure(s) (LRB): RIGHT TOTAL HIP ARTHROPLASTY ANTERIOR APPROACH (Right)  Anesthesia type: General  Patient location: PACU  Post pain: Pain level controlled and Adequate analgesia  Post assessment: Post-op Vital signs reviewed, Patient's Cardiovascular Status Stable, Respiratory Function Stable, Patent Airway and Pain level controlled  Last Vitals:  Filed Vitals:   10/06/13 1748  BP:   Pulse: 87  Temp:   Resp: 17    Post vital signs: Reviewed and stable  Level of consciousness: awake, alert  and oriented  Complications: No apparent anesthesia complications

## 2013-10-06 NOTE — H&P (Signed)
TOTAL HIP ADMISSION H&P  Patient is admitted for right total hip arthroplasty.  Subjective:  Chief Complaint: right hip pain  HPI: Stacey Bowman, 73 y.o. female, has a history of pain and functional disability in the right hip(s) due to arthritis and patient has failed non-surgical conservative treatments for greater than 12 weeks to include NSAID's and/or analgesics, corticosteriod injections, flexibility and strengthening excercises, supervised PT with diminished ADL's post treatment, use of assistive devices and activity modification.  Onset of symptoms was gradual starting 2 years ago with gradually worsening course since that time.The patient noted no past surgery on the right hip(s).  Patient currently rates pain in the right hip at 10 out of 10 with activity. Patient has night pain, worsening of pain with activity and weight bearing, trendelenberg gait, pain that interfers with activities of daily living and pain with passive range of motion. Patient has evidence of subchondral cysts, subchondral sclerosis, periarticular osteophytes and joint space narrowing by imaging studies. This condition presents safety issues increasing the risk of Bowman.  There is no current active infection.  Patient Active Problem List   Diagnosis Date Noted  . Arthritis of right hip 10/06/2013  . Degenerative arthritis of left hip 08/18/2013  . 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  . Iron deficiency anemia, unspecified 07/15/2013  . Endometrial ca 07/15/2013   Past Medical History  Diagnosis Date  . DDD (degenerative disc disease)   . Chronic bronchitis     was on Dexamethasone and has been off for surgery  . Endometrial adenocarcinoma 2012  . Lyme disease 2013  . Polyarteritis nodosa     was taking steroids-came off in May 2014  . DJD (degenerative joint disease)   . GERD (gastroesophageal reflux disease)     takes Omeprazole daily  . Emphysema     uses Advair inhaler bid and Albuterol  prn  . Unspecified essential hypertension     takes Ramipril,Catapres,and Metoprolol daily  . Muscle spasms of lower extremity     takes Flexeril daily as needed  . ADD (attention deficit disorder)     takes Ritalin daily  . OCD (obsessive compulsive disorder)   . Depression     takes Prozac daily  . Dysrhythmia     Rt BBB and Tacycardia  . Emphysema   . Shortness of breath     with exertion  . History of migraine   . Short-term memory loss   . Neuropathy   . Joint pain   . Joint swelling   . Back pain     stenosis  . Osteoporosis   . History of colon polyps   . Urinary frequency   . UTI (lower urinary tract infection) hx of    was on a Sulfa med-placed on it 08-05-13 as precaution for surgery  . Urinary urgency   . Anemia     iron deficiency  . History of staph infection 2013  . On home oxygen therapy   . Headache(784.0)     migraines stopped with menopause  . History of blood transfusion     Past Surgical History  Procedure Laterality Date  . Cholecystectomy    . Total abdominal hysterectomy w/ bilateral salpingoophorectomy    . Right arm surgery      fracure repair  . Right arm surgery    . Colonoscopy    . Joint replacement Left 08/19/13    hip  . Total hip arthroplasty Left 08/18/2013    Procedure:  LEFT TOTAL HIP ARTHROPLASTY ANTERIOR APPROACH;  Surgeon: Mcarthur Rossetti, MD;  Location: [facility];  Service: Orthopedics;  Laterality: Left;  . Abdominal hysterectomy    . Wrist surgery Right     ligament repair  . Porta catheter Right 2011    Prescriptions prior to admission  Medication Sig Dispense Refill  . albuterol (PROVENTIL HFA;VENTOLIN HFA) 108 (90 BASE) MCG/ACT inhaler Inhale 2 puffs into the lungs as needed for wheezing or shortness of breath.      Marland Kitchen ammonium lactate (LAC-HYDRIN) 12 % lotion Apply 1 application topically 3 (three) times a week.      Marland Kitchen aspirin EC 325 MG tablet Take 325 mg by mouth daily.      . benzonatate (TESSALON) 100 MG capsule  Take 100 mg by mouth 2 (two) times daily as needed for cough.      Marland Kitchen CARAFATE 1 GM/10ML suspension Take 2 g by mouth daily.       . celecoxib (CELEBREX) 200 MG capsule Take 200 mg by mouth 2 (two) times daily.      . cloNIDine (CATAPRES) 0.1 MG tablet Take 0.1 mg by mouth daily.      . clotrimazole (MYCELEX) 10 MG troche Take 10 mg by mouth every 4 (four) hours as needed.      . cyclobenzaprine (FLEXERIL) 10 MG tablet Take 10 mg by mouth at bedtime as needed for muscle spasms. May also take 3 x a week if needed.      . diphenhydrAMINE (BENADRYL) 25 mg capsule Take 25 mg by mouth every 6 (six) hours as needed (cough).      . docusate sodium (COLACE) 100 MG capsule Take 100 mg by mouth daily as needed for mild constipation (when taking any pain medication).      Marland Kitchen FLUoxetine (PROZAC) 40 MG capsule Take 40 mg by mouth daily.       . fluticasone (FLONASE) 50 MCG/ACT nasal spray Place 1 spray into both nostrils daily as needed for allergies or rhinitis.      . Fluticasone-Salmeterol (ADVAIR) 250-50 MCG/DOSE AEPB Inhale 1 puff into the lungs daily.      Marland Kitchen gabapentin (NEURONTIN) 300 MG capsule Take 900 mg by mouth 3 (three) times daily.       Marland Kitchen guaifenesin (HUMIBID E) 400 MG TABS tablet Take 400 mg by mouth 2 (two) times daily as needed (cough).      Marland Kitchen guaifenesin (ROBITUSSIN) 100 MG/5ML syrup Take 300 mg by mouth 4 (four) times daily as needed for cough.      . methylphenidate (RITALIN) 10 MG tablet Take 10 mg by mouth 2 (two) times daily.      . metoprolol tartrate (LOPRESSOR) 25 MG tablet Take 25 mg by mouth daily.       . mupirocin ointment (BACTROBAN) 2 % Apply 1 application topically daily as needed (sores on legs).      . nystatin (MYCOSTATIN) 100000 UNIT/ML suspension Use as directed 5 mLs in the mouth or throat 3 (three) times daily.       Marland Kitchen nystatin cream (MYCOSTATIN) Apply 1 application topically 3 (three) times daily as needed for dry skin.      Marland Kitchen omeprazole (PRILOSEC) 20 MG capsule Take 20  mg by mouth daily.      Marland Kitchen oxyCODONE (OXY IR/ROXICODONE) 5 MG immediate release tablet Take 0.5-2 tablets (2.5-10 mg total) by mouth daily.  60 tablet  0  . polyethylene glycol (MIRALAX / GLYCOLAX) packet Take 17 g  by mouth daily as needed for mild constipation.      . prochlorperazine (COMPAZINE) 10 MG tablet Take 10 mg by mouth every 4 (four) hours as needed for nausea or vomiting.      Marland Kitchen Prochlorperazine Maleate (COMPAZINE PO) Take 1 tablet by mouth daily as needed (nausea).      . promethazine (PHENERGAN) 25 MG suppository Place 25 mg rectally every 6 (six) hours as needed for nausea or vomiting.      . ramipril (ALTACE) 5 MG capsule Take 5 mg by mouth daily.        . Vitamin D, Ergocalciferol, (DRISDOL) 50000 UNITS CAPS capsule Take 50,000 Units by mouth every 7 (seven) days. Wednesday      . traMADol (ULTRAM) 50 MG tablet Take by mouth every 6 (six) hours as needed.       No Known Allergies  History  Substance Use Topics  . Smoking status: Former Smoker    Types: Cigarettes  . Smokeless tobacco: Never Used     Comment: quit in 1997  . Alcohol Use: No     Comment: recovering alcoholic, quit in 4536    Family History  Problem Relation Age of Onset  . Cervical cancer Maternal Aunt   . Emphysema Mother   . Diabetes Father   . Diabetes Maternal Grandmother   . Heart disease Mother   . Heart disease Maternal Grandmother   . Lung disease Brother      Review of Systems  Musculoskeletal: Positive for joint pain.  All other systems reviewed and are negative.    Objective:  Physical Exam  Constitutional: She is oriented to person, place, and time. She appears well-developed and well-nourished.  HENT:  Head: Normocephalic and atraumatic.  Eyes: EOM are normal. Pupils are equal, round, and reactive to light.  Neck: Normal range of motion. Neck supple.  Cardiovascular: Normal rate and regular rhythm.   Respiratory: Effort normal and breath sounds normal.  GI: Soft. Bowel sounds  are normal.  Musculoskeletal:       Right hip: She exhibits decreased range of motion, decreased strength and bony tenderness.  Neurological: She is alert and oriented to person, place, and time.  Skin: Skin is warm and dry.  Psychiatric: She has a normal mood and affect.    Vital signs in last 24 hours: Temp:  [97.8 F (36.6 C)] 97.8 F (36.6 C) (02/03 1219) Pulse Rate:  [86] 86 (02/03 1219) Resp:  [16] 16 (02/03 1219) BP: (129)/(66) 129/66 mmHg (02/03 1219) SpO2:  [100 %] 100 % (02/03 1219)  Labs:   Estimated body mass index is 26.22 kg/(m^2) as calculated from the following:   Height as of 09/30/13: 5\' 3"  (1.6 m).   Weight as of 08/13/13: 67.132 kg (148 lb).   Imaging Review Plain radiographs demonstrate severe degenerative joint disease of the right hip(s). The bone quality appears to be good for age and reported activity level.  Assessment/Plan:  End stage arthritis, right hip(s)  The patient history, physical examination, clinical judgement of the provider and imaging studies are consistent with end stage degenerative joint disease of the right hip(s) and total hip arthroplasty is deemed medically necessary. The treatment options including medical management, injection therapy, arthroscopy and arthroplasty were discussed at length. The risks and benefits of total hip arthroplasty were presented and reviewed. The risks due to aseptic loosening, infection, stiffness, dislocation/subluxation,  thromboembolic complications and other imponderables were discussed.  The patient acknowledged the explanation, agreed to proceed  with the plan and consent was signed. Patient is being admitted for inpatient treatment for surgery, pain control, PT, OT, prophylactic antibiotics, VTE prophylaxis, progressive ambulation and ADL's and discharge planning.The patient is planning to be discharged home with home health services

## 2013-10-06 NOTE — Brief Op Note (Signed)
10/06/2013  4:03 PM  PATIENT:  Stacey Bowman  73 y.o. female  PRE-OPERATIVE DIAGNOSIS:  Severe osteoarthritis right hip  POST-OPERATIVE DIAGNOSIS:  Severe osteoarthritis right hip  PROCEDURE:  Procedure(s): RIGHT TOTAL HIP ARTHROPLASTY ANTERIOR APPROACH (Right)  SURGEON:  Surgeon(s) and Role:    * Mcarthur Rossetti, MD - Primary  PHYSICIAN ASSISTANT: Benita Stabile, PA-C  ANESTHESIA:   general  EBL:  Total I/O In: 1000 [I.V.:1000] Out: 700 [Urine:100; Blood:600]  BLOOD ADMINISTERED:none  DRAINS: none   LOCAL MEDICATIONS USED:  NONE  SPECIMEN:  No Specimen  DISPOSITION OF SPECIMEN:  N/A  COUNTS:  YES  TOURNIQUET:  * No tourniquets in log *  DICTATION: .Other Dictation: Dictation Number [id number]  PLAN OF CARE: Admit to inpatient   PATIENT DISPOSITION:  PACU - hemodynamically stable.   Delay start of Pharmacological VTE agent (>24hrs) due to surgical blood loss or risk of bleeding: no

## 2013-10-06 NOTE — Preoperative (Signed)
Beta Blockers   Reason not to administer Beta Blockers:Took lopressor 2/3 am

## 2013-10-06 NOTE — Anesthesia Procedure Notes (Signed)
Procedure Name: Intubation Date/Time: 10/06/2013 2:22 PM Performed by: Julian Reil Pre-anesthesia Checklist: Patient identified, Emergency Drugs available, Suction available and Patient being monitored Patient Re-evaluated:Patient Re-evaluated prior to inductionOxygen Delivery Method: Circle system utilized Preoxygenation: Pre-oxygenation with 100% oxygen Intubation Type: IV induction Ventilation: Mask ventilation without difficulty Laryngoscope Size: Mac and 3 Grade View: Grade I Tube type: Oral Tube size: 7.0 mm Number of attempts: 1 Airway Equipment and Method: Stylet Placement Confirmation: ETT inserted through vocal cords under direct vision,  positive ETCO2 and breath sounds checked- equal and bilateral Secured at: 22 cm Tube secured with: Tape Dental Injury: Teeth and Oropharynx as per pre-operative assessment

## 2013-10-06 NOTE — Transfer of Care (Signed)
Immediate Anesthesia Transfer of Care Note  Patient: Stacey Bowman  Procedure(s) Performed: Procedure(s): RIGHT TOTAL HIP ARTHROPLASTY ANTERIOR APPROACH (Right)  Patient Location: PACU  Anesthesia Type:General  Level of Consciousness: awake  Airway & Oxygen Therapy: Patient Spontanous Breathing and Patient connected to nasal cannula oxygen  Post-op Assessment: Report given to PACU RN and Post -op Vital signs reviewed and stable  Post vital signs: Reviewed and stable  Complications: No apparent anesthesia complications

## 2013-10-07 ENCOUNTER — Inpatient Hospital Stay (HOSPITAL_COMMUNITY): Payer: Medicare Other

## 2013-10-07 ENCOUNTER — Encounter (HOSPITAL_COMMUNITY): Payer: Self-pay | Admitting: General Practice

## 2013-10-07 LAB — CBC
HCT: 29.7 % — ABNORMAL LOW (ref 36.0–46.0)
HEMOGLOBIN: 9.6 g/dL — AB (ref 12.0–15.0)
MCH: 27.6 pg (ref 26.0–34.0)
MCHC: 32.3 g/dL (ref 30.0–36.0)
MCV: 85.3 fL (ref 78.0–100.0)
PLATELETS: 206 10*3/uL (ref 150–400)
RBC: 3.48 MIL/uL — AB (ref 3.87–5.11)
RDW: 18.3 % — ABNORMAL HIGH (ref 11.5–15.5)
WBC: 11 10*3/uL — ABNORMAL HIGH (ref 4.0–10.5)

## 2013-10-07 LAB — BASIC METABOLIC PANEL
BUN: 19 mg/dL (ref 6–23)
CALCIUM: 7.8 mg/dL — AB (ref 8.4–10.5)
CO2: 26 mEq/L (ref 19–32)
Chloride: 101 mEq/L (ref 96–112)
Creatinine, Ser: 0.6 mg/dL (ref 0.50–1.10)
GFR calc Af Amer: >90 mL/min (ref >90)
GFR, EST NON AFRICAN AMERICAN: 90 mL/min — AB (ref >90)
GLUCOSE: 132 mg/dL — AB (ref 70–99)
Potassium: 3.9 mEq/L (ref 3.7–5.3)
Sodium: 140 mEq/L (ref 137–147)

## 2013-10-07 NOTE — Progress Notes (Signed)
Physical Therapy Treatment Note  Very sleepy for afternoon session, with O2 sats in the mid 80's on 3 L oxygen via [place]; Assisted pt back to bed, Placed on air entrainment mask 50% O2 and sats increased to 94%  Feel we need to pursue post-acute rehab options for Ms. Gola at this time -- with hopes that she will make significant progress and we can update her dc plan to home over the next few sessions   10/07/13 1600  PT Visit Information  Last PT Received On 10/07/13  Assistance Needed +2  History of Present Illness Pt is s/p direct anterior R THA; Recovery is complicated by hypoxia  PT Time Calculation  PT Start Time 1339  PT Stop Time 1355  PT Time Calculation (min) 16 min  Subjective Data  Subjective Very sleepy; agreeable to getting back to bed, and smiling once in bed; Still did not say much  Patient Stated Goal home  Precautions  Precautions Fall  Restrictions  Weight Bearing Restrictions No  RLE Weight Bearing WBAT  Cognition  Arousal/Alertness Suspect due to medications (VERY sleepy)  Behavior During Therapy [facility] for tasks assessed/performed  Overall Cognitive Status Within Functional Limits for tasks assessed  Bed Mobility  Overal bed mobility + 2 for safety/equipment;Needs Assistance  Bed Mobility Sit to Supine  Supine to sit Max assist;+2 for physical assistance  General bed mobility comments Physical assist for all aspects of bed mobility this afternoon, especially with lifting LEs onto bed  Transfers  Overall transfer level Needs assistance  Equipment used Rolling walker (2 wheeled)  Transfers Sit to/from Stand  Sit to Stand +2 physical assistance;Mod assist;+2 safety/equipment  General transfer comment much difficulty with weight bearing on R LE to take a few steps back to bed. Physical assist for rising from recliner, balance support througout transfe and descent to bed.   Ambulation/Gait  Ambulation/Gait assistance +2 physical assistance;Max assist  Ambulation  Distance (Feet) 5 Feet  Assistive device Rolling walker (2 wheeled)  Gait Pattern/deviations Step-to pattern  General Gait Details did not tolerate much weight through RLE, therefore required max supportive assist during L swing/R stance  Balance  Sitting balance-Leahy Scale Good  Standing balance-Leahy Scale Poor  Exercises  Exercises Total Joint  Total Joint Exercises  Ankle Circles/Pumps (Attempted; but pt did not tolerate any motion)  PT - End of Session  Equipment Utilized During Treatment Gait belt;Oxygen  Activity Tolerance Patient limited by pain  Patient left in chair;with call bell/phone within reach  Nurse Communication Mobility status  PT - Assessment/Plan  PT Plan Discharge plan needs to be updated (Need to consider postacute rehab)  PT Frequency 7X/week  Recommendations for Other Services OT consult  Follow Up Recommendations Other (comment);Supervision/Assistance - 24 hour (Postacute rehab )  PT equipment None recommended by PT  PT Goal Progression  Progress towards PT goals Not progressing toward goals - comment (limited by sleepiness; hypoxia)  Acute Rehab PT Goals  PT Goal Formulation With patient  Time For Goal Achievement 10/14/13  Potential to Achieve Goals Good  PT General Charges  $$ ACUTE PT VISIT 1 Procedure  PT Treatments  $Therapeutic Activity 8-22 mins   [place], [place]

## 2013-10-07 NOTE — Progress Notes (Signed)
Subjective: 1 Day Post-Op Procedure(s) (LRB): RIGHT TOTAL HIP ARTHROPLASTY ANTERIOR APPROACH (Right) Patient reports pain as moderate.    Objective: Vital signs in last 24 hours: Temp:  [97.6 F (36.4 C)-100 F (37.8 C)] 100 F (37.8 C) (02/04 0300) Pulse Rate:  [54-118] 115 (02/04 0417) Resp:  [12-20] 15 (02/04 0400) BP: (95-138)/(39-80) 138/64 mmHg (02/04 0300) SpO2:  [80 %-100 %] 97 % (02/04 0417) Weight:  [74.8 kg (164 lb 14.5 oz)] 74.8 kg (164 lb 14.5 oz) (02/03 2008)  Intake/Output from previous day: 02/03 0701 - 02/04 0700 In: 1630 [P.O.:80; I.V.:1550] Out: 850 [Urine:250; Blood:600] Intake/Output this shift: Total I/O In: 1003.8 [P.O.:100; I.V.:903.8] Out: -    Recent Labs  10/07/13 0705  HGB 9.6*    Recent Labs  10/07/13 0705  WBC 11.0*  RBC 3.48*  HCT 29.7*  PLT 206    Recent Labs  10/07/13 0705  NA 140  K 3.9  CL 101  CO2 26  BUN 19  CREATININE 0.60  GLUCOSE 132*  CALCIUM 7.8*   No results found for this basename: LABPT, INR,  in the last 72 hours  Sensation intact distally Dorsiflexion/Plantar flexion intact Compartment soft  Assessment/Plan: 1 Day Post-Op Procedure(s) (LRB): RIGHT TOTAL HIP ARTHROPLASTY ANTERIOR APPROACH (Right) Up with therapy Acute blood loss anemia secondary to surgery will monitor for symptoms of anemia  CLARK, GILBERT 10/07/2013, 8:37 AM

## 2013-10-07 NOTE — Op Note (Signed)
NAMESAIDI, SANTACR[place]E NO.:  [id number]  MEDICAL RECORD NO.:  [id number]  LOCATION:  5N25C                        FACILITY:  [facility]  PHYSICIAN:  Lind Guest. Ninfa Linden, M.D.DATE OF BIRTH:  1941/02/05  DATE OF PROCEDURE:  10/06/2013 DATE OF DISCHARGE:                              OPERATIVE REPORT   PREOPERATIVE DIAGNOSES:  Severe end-stage arthritis and degenerative joint disease of right hip.  POSTOPERATIVE DIAGNOSES:  Severe end-stage arthritis and degenerative joint disease of right hip.  PROCEDURE:  Right total hip arthroplasty through direct anterior approach.  IMPLANTS:  DePuy Sector Gription acetabular component size 48, size 32+ 4 neutral polyethylene liner, size 11 Corail femoral component with varus offset, size 32+ 1 ceramic hip ball.  SURGEON:  Lind Guest. Ninfa Linden, M.D.  ASSISTANT:  Erskine Emery, PA-C  ANESTHESIA:  General.  ANTIBIOTICS:  2 g of IV Ancef.  ESTIMATED BLOOD LOSS:  600 mL.  COMPLICATIONS:  None.  INDICATIONS:  Ms. Stacey Bowman is a 73 year old female with known severe end-stage osteoarthritis of both her hips.  She underwent successful left total hip arthroplasty in December of past year, only 6 weeks ago.  Due to her debilitating pain on the right side, at this point, she wished to proceed with right total hip arthroplasty.  The risks and benefits of the surgery were explained to her in detail, and she does wish to proceed with surgery.  PROCEDURE DESCRIPTION:  After informed consent was obtained, appropriate right hip was marked, she was brought to the operating room, and general anesthesia was obtained while she was on her stretcher.  Foley catheter was placed and then both legs had traction boots applied to them.  Next, she was placed supine on the Hana fracture table.  Perineal post was placed and both legs in inline skeletal traction, but no traction applied.  Her right operative hip was then prepped and  draped with DuraPrep and sterile drapes.  A time-out was called and she was identified as correct patient and correct right hip.  We then made an incision inferior and posterior to the anterior superior iliac spine and dissected down to the tensor fascia lata muscle.  The tensor fascia was then divided longitudinally and we proceed with direct anterior approach to the hip.  We cauterized the lateral femoral circumflex vessels and then open up the hip capsule in L-type format placing Cobra retractor in the hip capsule.  We made a femoral neck cut then proximal to the lesser trochanter with an oscillating saw and completed this with an osteotome. We placed a corkscrew guide in the femoral head and removed the femoral head in its entirety.  We found a devoid of cartilage.  We then cleaned the acetabulum and debris, and placed a bent Hohmann medially and Cobra retractor laterally.  We began reaming in 2-mm increments from size 42 up to a size 48, mismatched to her other side.  We then placed the real DePuy Sector Gription acetabular component size 48 under direct visualization and direct fluoroscopy, so we could obtain our depth of placement, our inclination and anteversion.  We then turned attention to the femur.  With the leg externally rotated to 100  degrees, extended and adducted, we were able to place a Mueller retractor medially and Hohmann retractor behind the greater trochanter.  We released the lateral joint capsule and then used a box cutting osteotome to open up the femoral canal and a rongeur to lateralize.  Began broaching from a size 8 broach and we had to broach on this side up to a size 11 because the size 10 was just too lose surprisingly.  With the size 11, we trialed a varus neck length and a size 32+ 1 hip ball was shortest we can do and reduced this in the acetabulum, which was very tight and showed her leg lengths were longer on that side compared to her left side.  I  then dislocated the hip and removed the trial components and placed the trial 10 stem again to see if we could bring this lower, but it was still too loose in the canal for my comfort.  So, we had to go back and place the real size 11 stem with varus offset and the real 32+ 1 ceramic hip ball and reduced this in the acetabulum and was completely stable, but I still felt like she was little long on this side.  We then copiously irrigated the soft tissues with normal saline solution, was able to close the joint capsule with #1 Ethibond suture followed by running #1 Vicryl in the tensor fascia, 0 Vicryl in the deep tissue, 2-0 Vicryl in the subcutaneous tissue, 4-0 Monocryl subcuticular stitch and Steri-Strips on the skin and Aquacel dressing.  She was then taken off of the Hana table, awakened, extubated and taken to the recovery room in stable condition.  All final counts were correct.  There were no complications noted.  Postoperatively, we will let her weightbear as tolerated and increase activities as comfort allows to physical therapy.  Of note, Carney Bern, PA-C was present during the entire case and his presence was crucial for positioning, retracting and closure of the wound.     Lind Guest. Ninfa Linden, M.D.     CYB/MEDQ  D:  10/06/2013  T:  10/07/2013  Job:  [id number]

## 2013-10-07 NOTE — Evaluation (Addendum)
Occupational Therapy Evaluation Patient Details Name: Stacey Bowman MRN: [id number] DOB: [birth year] Today's Date: 10/07/2013 Time: 4580-9983 OT Time Calculation (min): 30 min  OT Assessment / Plan / Recommendation History of present illness Pt is s/p direct anterior R THA   Clinical Impression   Pt is limited this date by some nausea and significant pain. Nursing able to give pt meds during session. She will benefit from skilled OT services to increase independence with self care tasks for d/c hopefully home with friend assisting 24/7 like her previous hip surgery in Dec 2014. Pt on home O2.    OT Assessment  Patient needs continued OT Services    Follow Up Recommendations  Home health OT;Supervision/Assistance - 24 hour ONLY if pt able to progress to a level that her friend can safely manage her at home 24/7. If not will need SNF at d/c.     Barriers to Discharge      Equipment Recommendations  None recommended by OT    Recommendations for Other Services    Frequency  Min 2X/week    Precautions / Restrictions Precautions Precautions: Fall Restrictions Weight Bearing Restrictions: No RLE Weight Bearing: Weight bearing as tolerated   Pertinent Vitals/Pain 9/10 with activity; reposition, nursing gave meds during session  On 3L O2 during session as this is what she is on at home. Returned to 4L at end of session. Pt sats decreased to upper 80s (87-90%) with activity on the 3L and returned to 93% once on 4L and rested. HR up to 125 with activity and returned to 110s with rest.    ADL  Eating/Feeding: Simulated;Independent Where Assessed - Eating/Feeding: Bed level Grooming: Wash/dry face;Set up Where Assessed - Grooming: Supported sitting Upper Body Bathing: Simulated;Chest;Right arm;Left arm;Abdomen;Minimal assistance Where Assessed - Upper Body Bathing: Unsupported sitting Lower Body Bathing: +2 Total assistance (+2 for safety, physical assist) Where Assessed - Lower Body  Bathing: Supported sit to stand Upper Body Dressing: Moderate assistance Where Assessed - Upper Body Dressing: Unsupported sitting Lower Body Dressing: Simulated;+2 Total assistance (+2 for safety and physical assist) Where Assessed - Lower Body Dressing: Supported sit to stand Toilet Transfer: Simulated;Maximal assistance (+2 for safety and physical assist) Toilet Transfer Method:  (a few steps to recliner) Toileting - Clothing Manipulation and Hygiene: Simulated;+2 Total assistance Where Assessed - Toileting Clothing Manipulation and Hygiene: Sit to stand from 3-in-1 or toilet Equipment Used: Rolling walker;Other (comment) (oxygen) ADL Comments: Pt limited by pain this session and O2 does decrease with activity to upper 80s. On home O2 at 3L. Pt with much difficulty trying to weight bear on R LE and needs significant assist for balance support and manuevering walker. She may need SNF if unable to progress to a level to manage at home with friend. She had opposite hip done in Dec 2014. She has AE but reports having difficulty using sock aid and would like to try it again.    OT Diagnosis: Generalized weakness  OT Problem List: Decreased strength;Decreased knowledge of use of DME or AE;Decreased activity tolerance OT Treatment Interventions: Self-care/ADL training;DME and/or AE instruction;Therapeutic activities;Patient/family education   OT Goals(Current goals can be found in the care plan section) Acute Rehab OT Goals Patient Stated Goal: home OT Goal Formulation: With patient Time For Goal Achievement: 10/14/13 Potential to Achieve Goals: Good  Visit Information  Last OT Received On: 10/07/13 Assistance Needed: +2 PT/OT/SLP Co-Evaluation/Treatment: Yes Reason for Co-Treatment: Complexity of the patient's impairments (multi-system involvement);For patient/therapist safety OT goals addressed  during session: ADL's and self-care;Proper use of Adaptive equipment and DME History of Present  Illness: Pt is s/p direct anterior R THA       Prior Functioning     Home Living Family/patient expects to be discharged to:: Private residence Living Arrangements: Non-relatives/Friends Available Help at Discharge: Friend(s);Available 24 hours/day Type of Home: House Home Access: Ramped entrance Home Layout: One level;Able to live on main level with bedroom/bathroom Home Equipment: Wheelchair - [employer] - 2 wheels;Cane - single point;Tub bench;Bedside commode;Walker - 4 wheels;Adaptive equipment Adaptive Equipment: Reacher;Sock aid;Long-handled shoe horn;Long-handled sponge Prior Function Level of Independence: Needs assistance Comments: mostly using 4 wheeled walker inside the house. uses wheelchair outside the home. had assist with household tasks from friend. was modified  independent with self care tasks.  Communication Communication: No difficulties Dominant Hand: Right         Vision/Perception     Cognition  Cognition Arousal/Alertness: Awake/alert Behavior During Therapy: WFL for tasks assessed/performed Overall Cognitive Status: Within Functional Limits for tasks assessed    Extremity/Trunk Assessment Upper Extremity Assessment Upper Extremity Assessment: Generalized weakness     Mobility Bed Mobility Overal bed mobility: + 2 for safety/equipment;Needs Assistance Bed Mobility: Supine to Sit Supine to sit: Mod assist General bed mobility comments: assist for LEs over to EOB and trunk to upright. +2 for lines, tubes Transfers Overall transfer level: Needs assistance Equipment used: Rolling walker (2 wheeled) Transfers: Sit to/from [facility] Sit to Stand: +2 physical assistance;Mod assist;+2 safety/equipment Stand pivot transfers: Max assist;+2 physical assistance;+2 safety/equipment General transfer comment: much difficulty with weight bearing on R LE to take a few steps to recliner. Physical assist for rising from bed, balance support  througout transfe and descent to chair. Needs assist for hand placement on armrests.     Exercise     Balance     End of Session OT - End of Session Equipment Utilized During Treatment: Gait belt;Rolling walker;Oxygen Activity Tolerance: Patient limited by pain;Other (comment) (some nausea) Patient left: in chair;with call bell/phone within reach  [place], [place] [phone] 10/07/2013, 11:06 AM

## 2013-10-07 NOTE — Care Management Note (Signed)
CARE MANAGEMENT NOTE 10/07/2013  Patient:  Stacey Bowman, Stacey Bowman   Account Number:  [id number]  Date Initiated:  10/07/2013  Documentation initiated by:  Ricki Miller  Subjective/Objective Assessment:   73 yr old female s/p right total hip arthroplasty     Action/Plan:   Patient will need 24/7 supervision, CM waiting for PT eval.   Anticipated DC Date:     Anticipated DC Plan:           Choice offered to / List presented to:             Status of service:  In process, will continue to follow

## 2013-10-07 NOTE — Progress Notes (Signed)
Pt arrived to floor from PACU at 2005 complaining of 10 out of 10 pain. Patient repositioned in the bed and educated on call bell along with need for continuous pulse oximetry. Pulse oximeter applied and continuous sounds of alarm from the pulse ox agitated the patient. Patient's attention redirected while visitors are at the bedside. Pulse ox alarm continues to sound due to low oxygen saturations. Oxygen via nasal cannula increased from 2 L/min to 3 L/min. I went in to the room once again, reminded the patient to take some deep breaths through the nose while receiving oxygen via nasal cannula. Patient also educated with incentive spirometer and while showing her the spirometer patient exclaims "I'm not doing the incentive spirometer until you give me my Dilaudid that I'm supposed to get every two hours". Reminded patient that she was given Oxycodone 10 mg at 2026 and was also previously given Fentanyl in PACU. Patient continues to aggressively speak to me in harsh terms regarding the pulse ox exclaiming that "if you do not take that machine out of my room, I will get out of this bed and throw it, and if I fall, it will be the hospital's fault". Patient redirected and reminded that the pulse ox is needed for the evening to monitor patient's oxygen post operatively. Patient's bed alarm set; Pt insists alarming sounds reminds her of the past hospital visits, patient demands "she will call a taxi if she can get up". Patient given IV Dilaudid 1 mg at 2112 and patient is immediately satisfied and profusely apologizes for all of the inappropriate comments and behavior. Patient repositioned in the bed which helped with oxygen saturation and decreased the alarm beeping. Nursing will continue to monitor.

## 2013-10-07 NOTE — Evaluation (Signed)
Physical Therapy Evaluation Patient Details Name: Stacey Bowman MRN: [id number] DOB: [birth year] Today's Date: 10/07/2013 Time: 1194-1740 PT Time Calculation (min): 31 min  PT Assessment / Plan / Recommendation History of Present Illness  Pt is s/p direct anterior R THA  Clinical Impression  Patient is s/p above surgery resulting in functional limitations due to the deficits listed below (see PT Problem List).  Patient will benefit from skilled PT to increase their independence and safety with mobility to allow discharge to the venue listed below.       PT Assessment  Patient needs continued PT services    Follow Up Recommendations  Home health PT;Other (comment);Supervision/Assistance - 24 hour  Stacey Bowman will have to make significant progress with mobility in order to dc home; Still, a Total Hip pt can make good progress in 24 hours, and Stacey Bowman tells [place] she dc'd home with 24 hour assist after her L hip arthroplasty -- will continue to  monitor progress, and update dc plans appropriately We need to corroborate exactly how much assist is available to her, and it is worth considering postacute rehab options to maximize functional mobility prior to dc'ing home    Does the patient have the potential to tolerate intense rehabilitation      Barriers to Discharge Decreased caregiver support Need more info re: available assist; pt will have to make significant progress with mobiltiy in order to dc home    Equipment Recommendations  None recommended by PT    Recommendations for Other Services OT consult   Frequency 7X/week    Precautions / Restrictions Precautions Precautions: Fall Restrictions Weight Bearing Restrictions: No RLE Weight Bearing: Weight bearing as tolerated   Pertinent Vitals/Pain Did not rate, but pt in a significant amount of pain with [place] patient repositioned for comfort       Mobility  Bed Mobility Overal bed mobility: + 2 for safety/equipment;Needs  Assistance Bed Mobility: Supine to Sit Supine to sit: Mod assist General bed mobility comments: assist for LEs over to EOB and trunk to upright. +2 for lines, tubes Transfers Overall transfer level: Needs assistance Equipment used: Rolling walker (2 wheeled) Transfers: Sit to/from Stand Sit to Stand: +2 physical assistance;Mod assist;+2 safety/equipment Stand pivot transfers: Max assist;+2 physical assistance;+2 safety/equipment General transfer comment: much difficulty with weight bearing on R LE to take a few steps to recliner. Physical assist for rising from bed, balance support througout transfe and descent to chair. Needs assist for hand placement on armrests. Ambulation/Gait Ambulation/Gait assistance: +2 physical assistance;Max assist Ambulation Distance (Feet): 3 Feet (pivot steps bed to chair) Assistive device: Rolling walker (2 wheeled) General Gait Details: did not tolerate much weight through RLE, therefore required max supportive assist during L swing/R stance    Exercises Total Joint Exercises Ankle Circles/Pumps:  (Attempted; but pt did not tolerate any motion)   PT Diagnosis: Difficulty walking;Acute pain;Generalized weakness  PT Problem List: Decreased strength;Decreased range of motion;Decreased activity tolerance;Decreased balance;Decreased mobility;Decreased knowledge of use of DME;Pain;Cardiopulmonary status limiting activity PT Treatment Interventions: DME instruction;Gait training;Functional mobility training;Therapeutic activities;Therapeutic exercise;Balance training;Patient/family education     PT Goals(Current goals can be found in the care plan section) Acute Rehab PT Goals Patient Stated Goal: home PT Goal Formulation: With patient Time For Goal Achievement: 10/14/13 Potential to Achieve Goals: Good  Visit Information  Last PT Received On: 10/07/13 Assistance Needed: +2 PT/OT/SLP Co-Evaluation/Treatment: Yes Reason for Co-Treatment: Complexity of the  patient's impairments (multi-system involvement);For patient/therapist safety PT goals addressed during  session: Mobility/safety with mobility OT goals addressed during session: ADL's and self-care;Proper use of Adaptive equipment and DME History of Present Illness: Pt is s/p direct anterior R THA       Prior [place] expects to be discharged to:: Private residence Living Arrangements: Non-relatives/Friends Available Help at Discharge: Friend(s);Available 24 hours/day Type of Home: House Home Access: Ramped entrance Home Layout: One level;Able to live on main level with bedroom/bathroom Home Equipment: Wheelchair - [employer] - 2 wheels;Cane - single point;Tub bench;Bedside commode;Walker - 4 wheels;Adaptive equipment Adaptive Equipment: Reacher;Sock aid;Long-handled shoe horn;Long-handled sponge Prior Function Level of Independence: Needs assistance Gait / Transfers Assistance Needed: primarily used w/c for mobility ADL's / Homemaking Assistance Needed: Friend assisted with homemaking and LB bathing and dressing. Comments: mostly using 4 wheeled walker inside the house. uses wheelchair outside the home. had assist with household tasks from friend. was modified  independent with self care tasks.  Communication Communication: No difficulties Dominant Hand: Right    Cognition  Cognition Arousal/Alertness: Awake/alert Behavior During Therapy: WFL for tasks assessed/performed Overall Cognitive Status: Within Functional Limits for tasks assessed    Extremity/Trunk Assessment Upper Extremity Assessment Upper Extremity Assessment: Generalized weakness Lower Extremity Assessment Lower Extremity Assessment: Generalized weakness;RLE deficits/detail RLE Deficits / Details: Grossly decr AROM and strength, limited by pain RLE: Unable to fully assess due to pain   Balance Balance Overall balance assessment: Needs assistance Sitting-balance support: Bilateral  upper extremity supported;Feet supported Sitting balance-Leahy Scale: Good Standing balance support: Bilateral upper extremity supported Standing balance-Leahy Scale: Poor  End of Session PT - End of Session Equipment Utilized During Treatment: Gait belt;Oxygen Activity Tolerance: Patient limited by pain Patient left: in chair;with call bell/phone within reach Nurse Communication: Mobility status  GP     Roney Marion [facility] [place], [place]  10/07/2013, 2:02 PM

## 2013-10-07 NOTE — Progress Notes (Signed)
Utilization review completed.  

## 2013-10-08 LAB — CBC
HCT: 26.7 % — ABNORMAL LOW (ref 36.0–46.0)
Hemoglobin: 8.6 g/dL — ABNORMAL LOW (ref 12.0–15.0)
MCH: 27.7 pg (ref 26.0–34.0)
MCHC: 32.2 g/dL (ref 30.0–36.0)
MCV: 85.9 fL (ref 78.0–100.0)
PLATELETS: 230 10*3/uL (ref 150–400)
RBC: 3.11 MIL/uL — ABNORMAL LOW (ref 3.87–5.11)
RDW: 18.2 % — AB (ref 11.5–15.5)
WBC: 15.8 10*3/uL — ABNORMAL HIGH (ref 4.0–10.5)

## 2013-10-08 MED ORDER — SODIUM CHLORIDE 0.9 % IJ SOLN
10.0000 mL | INTRAMUSCULAR | Status: DC | PRN
  Administered 2013-10-08: 20 mL
  Administered 2013-10-10 (×2): 10 mL

## 2013-10-08 MED ORDER — HYDROMORPHONE HCL PF 1 MG/ML IJ SOLN
0.5000 mg | INTRAMUSCULAR | Status: DC | PRN
  Administered 2013-10-08 – 2013-10-09 (×3): 0.5 mg via INTRAVENOUS
  Filled 2013-10-08 (×3): qty 1

## 2013-10-08 NOTE — Progress Notes (Signed)
Patient refused to work with PT and would not take po meds for pain and her IV med Dilaudid was not given pat got confused and combative on last evening after med was given MD made aware and will follow up  For something else for patient

## 2013-10-08 NOTE — Progress Notes (Signed)
Physical Therapy Treatment Patient Details Name: Stacey Bowman MRN: [id number] DOB: [birth year] Today's Date: 10/08/2013 Time: 6195-0932 PT Time Calculation (min): 28 min  PT Assessment / Plan / Recommendation  History of Present Illness Pt is s/p direct anterior R THA; Recovery is complicated by hypoxia   PT Comments   Pt. Declined to fully participate today due to issue with her pain med.  She requested "liquid dilaudid" and would not accept po pain pills.   She would not agree to get OOB until she was given dilaudid.  Lynn Ito and [place], RNs aware and Lynn Ito to contact MD.  Limited progress today.  Pt. Will likely need post acute SNF unless she shows dramatic improvements over next session or two.  Follow Up Recommendations  SNF (unless pt. can adequately progress for home with 24/7 assist)     Does the patient have the potential to tolerate intense rehabilitation     Barriers to Discharge        Equipment Recommendations  None recommended by PT    Recommendations for Other Services    Frequency 7X/week   Progress towards PT Goals Progress towards PT goals: Not progressing toward goals - comment (due to pt. refusal)  Plan Discharge plan needs to be updated    Precautions / Restrictions Precautions Precautions: Fall Restrictions Weight Bearing Restrictions: Yes RLE Weight Bearing: Weight bearing as tolerated   Pertinent Vitals/Pain See vitals tab Pt. Would not agree to get OOB with PT until and unless she was given dilaudid    Mobility  Bed Mobility General bed mobility comments: Pt. agreeable to practice moving hips to EOB at mod I level to scoot toward right sided of bed.  She would not, however, agree to attemtp to sit at [facility] Transfers General transfer comment: Pt. refused  Ambulation/Gait General Gait Details: Pt. refused    Exercises Total Joint Exercises Ankle Circles/Pumps: AROM;Both;10 reps;Supine Quad Sets: AROM;Both;10 reps;Supine Short Arc Quad:  AROM;Right;10 reps;Supine   PT Diagnosis:    PT Problem List:   PT Treatment Interventions:     PT Goals (current goals can now be found in the care plan section) Acute Rehab PT Goals Patient Stated Goal: home  Visit Information  Last PT Received On: 10/08/13 Assistance Needed: +2 (1 for bed exercises) History of Present Illness: Pt is s/p direct anterior R THA; Recovery is complicated by hypoxia    Subjective Data  Subjective: Pt. began to adamantly state part way through the session that she would not get OOB unless she could get some dilaudid.  RN in to offer po pain med but pt. refused.  She said the only thing she would accept would be dilaudid and the only way she would get OOB with PT was with dilaudid on board. Patient Stated Goal: home   Cognition  Cognition Arousal/Alertness: Awake/alert Behavior During Therapy: WFL for tasks assessed/performed;Agitated (WFL and slightly agitated at times) Overall Cognitive Status: Within Functional Limits for tasks assessed    Balance     End of Session PT - End of Session Activity Tolerance: Patient limited by pain;Other (comment) (limited due to refusal) Patient left: in bed;with call bell/phone within reach Nurse Communication: Mobility status   GP     Ladona Ridgel 10/08/2013, 12:42 PM Gerlean Ren PT Acute Rehab Services [place] [phone]

## 2013-10-08 NOTE — Progress Notes (Signed)
Clinical Social Work Department BRIEF PSYCHOSOCIAL ASSESSMENT 10/08/2013  Patient:  Stacey Bowman, Stacey Bowman     Account Number:  [id number]     Admit date:  10/06/2013  Clinical Social Worker:  Adair Laundry  Date/Time:  10/08/2013 04:00 PM  Referred by:  Physician  Date Referred:  10/08/2013 Referred for  SNF Placement   Other Referral:   Interview type:  Patient Other interview type:    PSYCHOSOCIAL DATA Living Status:  FRIEND(S) Admitted from facility:   Level of care:   Primary support name:  Dewayne Hatch [phone] Primary support relationship to patient:  FRIEND Degree of support available:   Pt reports having limited support but the support she does have is strong    CURRENT CONCERNS Current Concerns  Post-Acute Placement   Other Concerns:    SOCIAL WORK ASSESSMENT / PLAN CSW aware of PT recommendation for SNF. CSW discussed recommendation for pt. Pt informed CSW she is not agreeable and only wants to dc home. CSW did explain that PT is looking to see if pt progresses but asked if pt would reconsider if PT continues to recommend SNF. Pt said "no, the doctor promised me I would go home". CSW explained reason for recommendation and concern for safety. CSW asked if pt would have any support at home. Pt reports having the support of her friend/roommate Ann. CSW asked if Lelon Frohlich would have the strength to help pt if needed. Pt stated "yes, but I can actually do it myself". Pt wanting to show CSW that she could get up, CSW did tell pt this was not necessary and pt should instead make sure that she is making the most of her PT sessions here at the hospital so they can see progress. CSW did explain that this woud be pt decision but people from the medical team may continue to express recommendation to pt. Pt said she understood we all need to do our job.   Assessment/plan status:  Psychosocial Support/Ongoing Assessment of Needs Other assessment/ plan:   Information/referral to community  resources:   SNF List denied    PATIENT'S/FAMILY'S RESPONSE TO PLAN OF CARE: Pt is not agreeable for SNF and would like to dc home.       [place], [place]

## 2013-10-08 NOTE — Progress Notes (Signed)
Occupational Therapy Treatment Patient Details Name: Stacey Bowman MRN: [id number] DOB: 1940-09-04 Today's Date: 10/08/2013 Time: 5621-3086 OT Time Calculation (min): 34 min  OT Assessment / Plan / Recommendation  History of present illness Pt is s/p direct anterior R THA; Recovery is complicated by hypoxia   OT comments  This 73 yo making progress since eval yesterday; however cognition is worrisome and unsure if pt's friend can handle her at home, thus feel she would benefit from SNF level of therapies before home. Will continue to follow.  Follow Up Recommendations  SNF;Supervision/Assistance - 24 hour       Equipment Recommendations  None recommended by OT       Frequency Min 2X/week   Progress towards OT Goals Progress towards OT goals: Progressing toward goals  Plan Discharge plan needs to be updated    Precautions / Restrictions Precautions Precautions: Fall Restrictions Weight Bearing Restrictions: No RLE Weight Bearing: Weight bearing as tolerated       ADL  Toilet Transfer: Minimal assistance Toilet Transfer Method: Sit to stand;Stand pivot Toilet Transfer Equipment: Bedside commode Toileting - Clothing Manipulation and Hygiene: Minimal assistance Where Assessed - Toileting Clothing Manipulation and Hygiene: Sit to stand from 3-in-1 or toilet Transfers/Ambulation Related to ADLs: Min A with RW for all ADL Comments: Pt reports that her friend that lives with her with help her with her LB ADLs (this has not been clarified as of yet)      OT Goals(current goals can now be found in the care plan section) Acute Rehab OT Goals Patient Stated Goal: home  Visit Information  Last OT Received On: 10/08/13 Assistance Needed: +1 History of Present Illness: Pt is s/p direct anterior R THA; Recovery is complicated by hypoxia          Cognition  Cognition Arousal/Alertness: Awake/alert Behavior During Therapy: WFL for tasks assessed/performed Overall Cognitive  Status: Impaired/Different from baseline Area of Impairment: Memory General Comments: Within one hour of seeing pt she managed to lose her glasses once and her cell phone 3 times (first time it was in the bed sheets, second time it was underneath the pad she was sitting on, and the third time it was in her gown pocket where it has just been put 10 minutes before). Not sure if her caregiver can handle her or not    Mobility  Bed Mobility Overal bed mobility: Needs Assistance Bed Mobility: Supine to Sit Supine to sit: Min guard;HOB elevated General bed mobility comments: Pt. agreeable to practice moving hips to EOB at mod I level to scoot toward right sided of bed.  She would not, however, agree to attemtp to sit at EOB Transfers Overall transfer level: Needs assistance Equipment used: Rolling walker (2 wheeled) Transfers: Sit to/from Stand Sit to Stand: Min assist            End of Session OT - End of Session Equipment Utilized During Treatment: Gait belt;Rolling walker Activity Tolerance: Patient tolerated treatment well Patient left: in chair;with call bell/phone within reach    Almon Register [phone] 10/08/2013, 4:26 PM

## 2013-10-08 NOTE — Progress Notes (Signed)
Subjective: 2 Days Post-Op Procedure(s) (LRB): RIGHT TOTAL HIP ARTHROPLASTY ANTERIOR APPROACH (Right) Patient reports pain as mild.  Asymptomatic acute blood loss anemia.  Low O2 sats yesterday, but no acute findings on chest x-ray.  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  Objective: Vital signs in last 24 hours: Temp:  [98.3 F (36.8 C)-99.7 F (37.6 C)] 98.7 F (37.1 C) (02/05 0606) Pulse Rate:  [86-125] 86 (02/05 0606) Resp:  [19-22] 19 (02/05 0606) BP: (97-117)/(46-71) 117/71 mmHg (02/05 0606) SpO2:  [87 %-100 %] 98 % (02/05 0606)  Intake/Output from previous day: 02/04 0701 - 02/05 0700 In: 3048.8 [P.O.:1180; I.V.:1803.8] Out: 2400 [Urine:2400] Intake/Output this shift:     Recent Labs  10/07/13 0705  HGB 9.6*    Recent Labs  10/07/13 0705  WBC 11.0*  RBC 3.48*  HCT 29.7*  PLT 206    Recent Labs  10/07/13 0705  NA 140  K 3.9  CL 101  CO2 26  BUN 19  CREATININE 0.60  GLUCOSE 132*  CALCIUM 7.8*   No results found for this basename: LABPT, INR,  in the last 72 hours  Sensation intact distally Intact pulses distally Dorsiflexion/Plantar flexion intact Incision: dressing C/D/I  Assessment/Plan: 2 Days Post-Op Procedure(s) (LRB): RIGHT TOTAL HIP ARTHROPLASTY ANTERIOR APPROACH (Right) Up with therapy Plan for discharge tomorrow  Stacey Bowman 10/08/2013, 7:31 AM

## 2013-10-09 LAB — PREPARE RBC (CROSSMATCH)

## 2013-10-09 LAB — CBC
HEMATOCRIT: 23.5 % — AB (ref 36.0–46.0)
Hemoglobin: 7.8 g/dL — ABNORMAL LOW (ref 12.0–15.0)
MCH: 28.3 pg (ref 26.0–34.0)
MCHC: 33.2 g/dL (ref 30.0–36.0)
MCV: 85.1 fL (ref 78.0–100.0)
Platelets: 200 10*3/uL (ref 150–400)
RBC: 2.76 MIL/uL — AB (ref 3.87–5.11)
RDW: 18.5 % — ABNORMAL HIGH (ref 11.5–15.5)
WBC: 11.1 10*3/uL — AB (ref 4.0–10.5)

## 2013-10-09 MED ORDER — ASPIRIN 325 MG PO TBEC: 325.0000 mg | DELAYED_RELEASE_TABLET | Freq: Every day | ORAL | Status: DC

## 2013-10-09 MED ORDER — FUROSEMIDE 10 MG/ML IJ SOLN
20.0000 mg | Freq: Once | INTRAMUSCULAR | Status: AC
  Administered 2013-10-09: 20 mg via INTRAVENOUS
  Filled 2013-10-09: qty 2

## 2013-10-09 MED ORDER — OXYCODONE-ACETAMINOPHEN 5-325 MG PO TABS: 1.0000 | ORAL_TABLET | ORAL | Status: DC | PRN

## 2013-10-09 NOTE — Progress Notes (Signed)
CSW (Clinical Education officer, museum) spoke with pt again today about SNF. Pt is still refusing SNF and only wanting to go home. Pt is not giving CSW permission to begin SNF search even as back up. CSW will continue to follow incase pt does change mind.  Stacey Bowman, [place]

## 2013-10-09 NOTE — Progress Notes (Signed)
Occupational Therapy Treatment and Discharge Patient Details Name: Stacey Bowman MRN: [id number] DOB: July 14, 1941 Today's Date: 10/09/2013 Time: 0865-7846 OT Time Calculation (min): 17 min  OT Assessment / Plan / Recommendation  History of present illness Pt is s/p direct anterior R THA; Recovery is complicated by hypoxia   OT comments  This 73 yo making progress. Her caregiver was in room and said she feels that there is no issue with pt going home and her being able to help her.  All acute OT completed, acute OT will sign off and pt to have [place].  Follow Up Recommendations  Home health OT;Supervision/Assistance - 24 hour       Equipment Recommendations  None recommended by OT       Frequency Min 2X/week   Progress towards OT Goals Progress towards OT goals: Progressing toward goals  Plan Discharge plan needs to be updated    Precautions / Restrictions Precautions Precautions: Fall Restrictions RLE Weight Bearing: Weight bearing as tolerated       ADL  Lower Body Dressing: Set up;Supervision/safety (with and without  AE (had to use sock aid to donn socks, but not to doff)) Where Assessed - Lower Body Dressing: Unsupported sit to stand      OT Goals(current goals can now be found in the care plan section)    Visit Information  Last OT Received On: 10/09/13 Assistance Needed: +1 History of Present Illness: Pt is s/p direct anterior R THA; Recovery is complicated by hypoxia          Cognition  Cognition Arousal/Alertness: Awake/alert Behavior During Therapy: WFL for tasks assessed/performed Overall Cognitive Status: Within Functional Limits for tasks assessed             End of Session OT - End of Session Activity Tolerance: Patient tolerated treatment well Patient left: with family/visitor present (sitting EOB)       Almon Register [phone] 10/09/2013, 5:09 PM

## 2013-10-09 NOTE — Discharge Summary (Signed)
Patient ID: Stacey Bowman MRN: [id number] DOB/AGE: 72/02/1941 72 y.o.  Admit date: 10/06/2013 Discharge date: 10/09/2013  Admission Diagnoses:  Principal Problem:   Arthritis of right hip Active Problems:   Status post THR (total hip replacement)   Discharge Diagnoses:  S/p right THR Acute blood loss anemia secondary to surgery requiring 2 units of PRBC's  Past Medical History  Diagnosis Date  . DDD (degenerative disc disease)   . Chronic bronchitis     was on Dexamethasone and has been off for surgery  . Endometrial adenocarcinoma 2012  . Lyme disease 2013  . Polyarteritis nodosa     was taking steroids-came off in May 2014  . DJD (degenerative joint disease)   . GERD (gastroesophageal reflux disease)     takes Omeprazole daily  . Emphysema     uses Advair inhaler bid and Albuterol prn  . Unspecified essential hypertension     takes Ramipril,Catapres,and Metoprolol daily  . Muscle spasms of lower extremity     takes Flexeril daily as needed  . ADD (attention deficit disorder)     takes Ritalin daily  . OCD (obsessive compulsive disorder)   . Depression     takes Prozac daily  . Dysrhythmia     Rt BBB and Tacycardia  . Emphysema   . Shortness of breath     with exertion  . History of migraine   . Short-term memory loss   . Neuropathy   . Joint pain   . Joint swelling   . Back pain     stenosis  . Osteoporosis   . History of colon polyps   . Urinary frequency   . UTI (lower urinary tract infection) hx of    was on a Sulfa med-placed on it 08-05-13 as precaution for surgery  . Urinary urgency   . Anemia     iron deficiency  . History of staph infection 2013  . On home oxygen therapy   . Headache(784.0)     migraines stopped with menopause  . History of blood transfusion     Surgeries: Procedure(s): RIGHT TOTAL HIP ARTHROPLASTY ANTERIOR APPROACH on 10/06/2013   Consultants:    Discharged Condition: Improved  Hospital Course: Stacey Bowman is an 73  y.o. female who was admitted 10/06/2013 for operative treatment ofArthritis of right hip. Patient has severe unremitting pain that affects sleep, daily activities, and work/hobbies. After pre-op clearance the patient was taken to the operating room on 10/06/2013 and underwent  Procedure(s): RIGHT TOTAL HIP ARTHROPLASTY ANTERIOR APPROACH.    Patient was given perioperative antibiotics: Anti-infectives   Start     Dose/Rate Route Frequency Ordered Stop   10/06/13 2100  ceFAZolin (ANCEF) IVPB 1 g/50 mL premix     1 g 100 mL/hr over 30 Minutes Intravenous Every 6 hours 10/06/13 1959 10/07/13 0444   10/06/13 0600  ceFAZolin (ANCEF) IVPB 2 g/50 mL premix     2 g 100 mL/hr over 30 Minutes Intravenous On call to O.R. 10/05/13 1402 10/06/13 1415       Patient was given sequential compression devices, early ambulation, and chemoprophylaxis to prevent DVT.  Patient benefited maximally from hospital stay. Did develop post-op anemia secondary to surgery requiring 2 units of PRBC's.    Recent vital signs: Patient Vitals for the past 24 hrs:  BP Temp Temp src Pulse Resp SpO2  10/09/13 1300 96/42 mmHg 97.3 F (36.3 C) Oral 94 18 95 %  10/09/13 1252 99/69 mmHg 98.3 F (  36.8 C) - 100 18 -  10/09/13 1217 101/70 mmHg 98.1 F (36.7 C) - 103 20 97 %  10/09/13 1200 98/71 mmHg 98.3 F (36.8 C) Oral 101 18 94 %  10/09/13 0952 - - - - - 94 %  10/09/13 0800 - - - - 18 95 %  10/09/13 0529 114/51 mmHg 98.5 F (36.9 C) - 107 16 95 %  10/09/13 0400 - - - - 17 -  10/08/13 2358 - - - - 16 -  10/08/13 2110 114/47 mmHg 100.3 F (37.9 C) Oral 121 18 95 %  10/08/13 2000 - - - - 16 -  10/08/13 1534 - - - - 16 96 %     Recent laboratory studies:  Recent Labs  10/07/13 0705 10/08/13 1555 10/09/13 0517  WBC 11.0* 15.8* 11.1*  HGB 9.6* 8.6* 7.8*  HCT 29.7* 26.7* 23.5*  PLT 206 230 200  NA 140  --   --   K 3.9  --   --   CL 101  --   --   CO2 26  --   --   BUN 19  --   --   CREATININE 0.60  --   --    GLUCOSE 132*  --   --   CALCIUM 7.8*  --   --      Discharge Medications:     Medication List    STOP taking these medications       traMADol 50 MG tablet  Commonly known as:  ULTRAM      TAKE these medications       albuterol 108 (90 BASE) MCG/ACT inhaler  Commonly known as:  PROVENTIL HFA;VENTOLIN HFA  Inhale 2 puffs into the lungs as needed for wheezing or shortness of breath.     ammonium lactate 12 % lotion  Commonly known as:  LAC-HYDRIN  Apply 1 application topically 3 (three) times a week.     aspirin 325 MG EC tablet  Take 1 tablet (325 mg total) by mouth daily with breakfast.     benzonatate 100 MG capsule  Commonly known as:  TESSALON  Take 100 mg by mouth 2 (two) times daily as needed for cough.     CARAFATE 1 GM/10ML suspension  Generic drug:  sucralfate  Take 2 g by mouth daily.     celecoxib 200 MG capsule  Commonly known as:  CELEBREX  Take 200 mg by mouth 2 (two) times daily.     cloNIDine 0.1 MG tablet  Commonly known as:  CATAPRES  Take 0.1 mg by mouth daily.     clotrimazole 10 MG troche  Commonly known as:  MYCELEX  Take 10 mg by mouth every 4 (four) hours as needed.     cyclobenzaprine 10 MG tablet  Commonly known as:  FLEXERIL  Take 10 mg by mouth at bedtime as needed for muscle spasms. May also take 3 x a week if needed.     diphenhydrAMINE 25 mg capsule  Commonly known as:  BENADRYL  Take 25 mg by mouth every 6 (six) hours as needed (cough).     docusate sodium 100 MG capsule  Commonly known as:  COLACE  Take 100 mg by mouth daily as needed for mild constipation (when taking any pain medication).     FLUoxetine 40 MG capsule  Commonly known as:  PROZAC  Take 40 mg by mouth daily.     fluticasone 50 MCG/ACT nasal spray  Commonly known  as:  FLONASE  Place 1 spray into both nostrils daily as needed for allergies or rhinitis.     Fluticasone-Salmeterol 250-50 MCG/DOSE Aepb  Commonly known as:  ADVAIR  Inhale 1 puff into the  lungs daily.     gabapentin 300 MG capsule  Commonly known as:  NEURONTIN  Take 900 mg by mouth 3 (three) times daily.     guaifenesin 400 MG Tabs tablet  Commonly known as:  HUMIBID E  Take 400 mg by mouth 2 (two) times daily as needed (cough).     guaifenesin 100 MG/5ML syrup  Commonly known as:  ROBITUSSIN  Take 300 mg by mouth 4 (four) times daily as needed for cough.     methylphenidate 10 MG tablet  Commonly known as:  RITALIN  Take 10 mg by mouth 2 (two) times daily.     metoprolol tartrate 25 MG tablet  Commonly known as:  LOPRESSOR  Take 25 mg by mouth daily.     mupirocin ointment 2 %  Commonly known as:  BACTROBAN  Apply 1 application topically daily as needed (sores on legs).     nystatin 100000 UNIT/ML suspension  Commonly known as:  MYCOSTATIN  Use as directed 5 mLs in the mouth or throat 3 (three) times daily.     nystatin cream  Commonly known as:  MYCOSTATIN  Apply 1 application topically 3 (three) times daily as needed for dry skin.     omeprazole 20 MG capsule  Commonly known as:  PRILOSEC  Take 20 mg by mouth daily.     oxyCODONE 5 MG immediate release tablet  Commonly known as:  Oxy IR/ROXICODONE  Take 0.5-2 tablets (2.5-10 mg total) by mouth daily.     oxyCODONE-acetaminophen 5-325 MG per tablet  Commonly known as:  ROXICET  Take 1-2 tablets by mouth every 4 (four) hours as needed for severe pain.     polyethylene glycol packet  Commonly known as:  MIRALAX / GLYCOLAX  Take 17 g by mouth daily as needed for mild constipation.     prochlorperazine 10 MG tablet  Commonly known as:  COMPAZINE  Take 10 mg by mouth every 4 (four) hours as needed for nausea or vomiting.     COMPAZINE PO  Take 1 tablet by mouth daily as needed (nausea).     promethazine 25 MG suppository  Commonly known as:  PHENERGAN  Place 25 mg rectally every 6 (six) hours as needed for nausea or vomiting.     ramipril 5 MG capsule  Commonly known as:  ALTACE  Take 5 mg  by mouth daily.     Vitamin D (Ergocalciferol) 50000 UNITS Caps capsule  Commonly known as:  DRISDOL  Take 50,000 Units by mouth every 7 (seven) days. Wednesday        Diagnostic Studies: Dg Hip Operative Right  2013/10/28   CLINICAL DATA:  Hip arthroplasty  EXAM: DG OPERATIVE RIGHT HIP  COMPARISON:  08/18/2013  FINDINGS: Right total hip arthroplasty is in place. Anatomic alignment. No breakage or loosening of the hardware. Left total hip arthroplasty is noted.  IMPRESSION: Right total hip arthroplasty placed with anatomic alignment.   Electronically Signed   By: Maryclare Bean M.D.   On: 2013/10/28 16:09   Dg Pelvis Portable  10/28/2013   CLINICAL DATA:  Osteoarthritis of the right hip.  EXAM: PORTABLE PELVIS 1-2 VIEWS  COMPARISON:  08/18/2013  FINDINGS: The patient has undergone right total hip prosthesis insertion. The components appear  in excellent position in the AP projection. Previously inserted left total hip prosthesis is noted with dystrophic calcifications in the soft tissues.  No acute osseous abnormality.  IMPRESSION: Satisfactory appearance of the right hip in the AP projection after total hip prosthesis insertion.   Electronically Signed   By: Rozetta Nunnery M.D.   On: 10/06/2013 17:40   Dg Chest Port 1 View  10/07/2013   CLINICAL DATA:  Hypoxemia.  EXAM: PORTABLE CHEST - 1 VIEW  COMPARISON:  DG CHEST 1V PORT dated 08/19/2013; CT ANGIO CHEST dated 12/24/2012  FINDINGS: Right IJ Port-A-Cath tip is at the SVC right atrial level. The heart size and mediastinal contours are stable. The lungs appear stable with emphysema and basilar scarring. There is no pleural effusion or pneumothorax. Old rib fractures are noted on the right.  IMPRESSION: Stable chronic lung disease.  No acute cardiopulmonary process.   Electronically Signed   By: Camie Patience M.D.   On: 10/07/2013 16:26   Dg Hip Portable 1 View Right  10/06/2013   CLINICAL DATA:  Osteoarthritis of the right hip.  EXAM: PORTABLE RIGHT HIP - 1  VIEW  COMPARISON:  None.  FINDINGS: Lateral view of the right hip after total hip prosthesis insertion demonstrates the components to be in excellent position. No complicating features.  IMPRESSION: Excellent appearance of the right hip in the lateral projection after total hip prosthesis insertion.   Electronically Signed   By: Rozetta Nunnery M.D.   On: 10/06/2013 17:40    Disposition: 06-Home-Health Care Svc      Discharge Orders   Future Orders Complete By Expires   Discharge wound care:  As directed    Comments:     May shower with dressing intact.Keep dressing cleanand intact until Monday, then remove and shower. Apply clean dressing after showering.      Follow-up Information   Schedule an appointment as soon as possible for a visit with Mcarthur Rossetti, MD.   Specialty:  Orthopedic Surgery   Contact information:   [place] [address] [phone]        Signed: Erskine Emery 10/09/2013, 3:24 PM

## 2013-10-09 NOTE — Progress Notes (Signed)
Physical Therapy Treatment Patient Details Name: Stacey Bowman MRN: [id number] DOB: 1941/06/30 Today's Date: 10/09/2013 Time: 5366-4403 PT Time Calculation (min): 31 min  PT Assessment / Plan / Recommendation  History of Present Illness Pt is s/p direct anterior R THA; Recovery is complicated by hypoxia   PT Comments   Patient less confused, however limited by fatigue (likely related to anemia and decr SaO2). Noted pt to have a transfusion today, and anticipate this will improve her ability to participate (including staying awake during exercises). She is adamantly refusing SNF, however has not yet demonstrated that she can be safe at home. Will continue to assess her discharge needs. Hopefully her roommate will be present this weekend to assist with determining the best plan.   Follow Up Recommendations  SNF;Supervision/Assistance - 24 hour;Other (pt adamantly refusing SNF; cognition improved but limited participation due to anemia)     Does the patient have the potential to tolerate intense rehabilitation     Barriers to Discharge        Equipment Recommendations  None recommended by PT    Recommendations for Other Services    Frequency 7X/week   Progress towards PT Goals Progress towards PT goals: Progressing toward goals  Plan Discharge plan needs to be updated    Precautions / Restrictions Precautions Precautions: Fall Restrictions RLE Weight Bearing: Weight bearing as tolerated   Pertinent Vitals/Pain SaO2 86% on arrival (pt sitting EOB on 4L); educated on deep breathing and increased to 92%. With ambulation on 4L pt decreased to 85%. Returned to 94% after 2 minutes.    Mobility  Bed Mobility General bed mobility comments: Pt sitting on EOB on arrival--reports she sat up by herself Transfers Overall transfer level: Needs assistance Equipment used: Rolling walker (2 wheeled) Transfers: Sit to/from Stand Sit to Stand: Min guard General transfer comment: vc for safe use  of RW during transitions Ambulation/Gait Ambulation/Gait assistance: Min guard Ambulation Distance (Feet): 20 Feet (12, rest, 20) Assistive device: Rolling walker (2 wheeled) Gait Pattern/deviations: Step-through pattern;Decreased stride length;Trunk flexed General Gait Details: Pt with low Hgb and awaiting transfusion, however wanted to use restroom and try to walk. Limited by general fatigue and decr SaO2    Exercises Total Joint Exercises Ankle Circles/Pumps: AROM;Both;15 reps;Seated Long Arc Quad: AROM;Both;15 reps;Seated   PT Diagnosis:    PT Problem List:   PT Treatment Interventions:     PT Goals (current goals can now be found in the care plan section) Acute Rehab PT Goals Patient Stated Goal: home  Visit Information  Last PT Received On: 10/09/13 Assistance Needed: +1 History of Present Illness: Pt is s/p direct anterior R THA; Recovery is complicated by hypoxia    Subjective Data  Subjective: reports she wears O2 at home "almost all the time. I'm supposed to just wear it at night" Patient Stated Goal: home   Cognition  Cognition Arousal/Alertness: Awake/alert Behavior During Therapy: WFL for tasks assessed/performed Overall Cognitive Status: Within Functional Limits for tasks assessed    Balance     End of Session PT - End of Session Equipment Utilized During Treatment: Gait belt;Oxygen Activity Tolerance: Patient limited by fatigue Patient left: in bed;with call bell/phone within reach Nurse Communication: Mobility status   GP     Stacey Bowman 10/09/2013, 12:59 PM Pager [phone]

## 2013-10-09 NOTE — Progress Notes (Signed)
Patient ID: Stacey Bowman, female   DOB: 1941/05/24, 73 y.o.   MRN: [id number] Therapy is recommending SNF placement.  The patient did go home after her other hip surgery in December.  Does not want to go to SNF.  Does have acute blood loss anemia and I will transfuse her today.  Still needs to get up with therapy so we can determine more clearly where she should go from here.

## 2013-10-09 NOTE — Care Management Note (Signed)
Patient is active with [facility].  Requesting resumption of care orders should patient discharge to home.

## 2013-10-10 LAB — TYPE AND SCREEN
ABO/RH(D): A POS
ANTIBODY SCREEN: NEGATIVE
UNIT DIVISION: 0
Unit division: 0

## 2013-10-10 MED ORDER — HEPARIN SOD (PORK) LOCK FLUSH 100 UNIT/ML IV SOLN
500.0000 [IU] | INTRAVENOUS | Status: AC | PRN
  Administered 2013-10-10: 500 [IU]

## 2013-10-10 NOTE — Progress Notes (Signed)
   CARE MANAGEMENT NOTE 10/10/2013  Patient:  Stacey Bowman, Stacey Bowman   Account Number:  [id number]  Date Initiated:  10/07/2013  Documentation initiated by:  Ricki Miller  Subjective/Objective Assessment:   73 yr old female s/p right total hip arthroplasty     Action/Plan:   Patient will need 24/7 supervision, CM waiting for PT eval.  patient preoperatively setup with [facility]. CM will follow   Anticipated DC Date:  10/10/2013   Anticipated DC Plan:  [place]  CM consult      Choice offered to / List presented to:  C-1 Patient        [place] arranged  [facility]   Status of service:  Completed, signed off Medicare Important Message given?   (If response is "NO", the following Medicare IM given date fields will be blank) Date Medicare IM given:   Date Additional Medicare IM given:    Discharge Disposition:    Per UR Regulation:    If discussed at Long Length of Stay Meetings, dates discussed:    Comments:  10/10/13 11:55 CM receives call pt to be discharged and add on occupational therapy.  CM called [facility] to notifiy of discharge and add on of [place] to [place].  No other CM needs were communicated.  Mariane Masters, BSN, [place].  10/09/13

## 2013-10-10 NOTE — Progress Notes (Signed)
Walked into room as Mary [place] was helping patient as patient was getting out of bed and heading towards her chair. Patient's SaO2 was in 60s to 70s. Patient was persistent, saying she wanted to go to her chair. We took her to her chair and then asked her to sit down but she would not listen and instead attempted to unplug the continuous pulse ox monitor. After realizing she had pulled the wrong plug, she disconnected her finger probe. We explained to patient the risks of not monitoring her oxygen, especially since patient's oxygen dropped to low 80s several times during the night. Patient stated "I know the risks and why you want to monitor me but I am not going to have a monitor at home so I will not wear one today." Charge nurse talked to patient but could not convince her to let us use the continuous pulse ox. Will monitor patient's oxygen level periodically. Patient is on 3L [place] and bed alarm is on. SaO2 back to 98%.

## 2013-10-10 NOTE — Progress Notes (Signed)
Subjective: Pt stable - doing well - comfortable on room air walking   Objective: Vital signs in last 24 hours: Temp:  [97.1 F (36.2 C)-98.9 F (37.2 C)] 98.9 F (37.2 C) (02/07 0501) Pulse Rate:  [67-103] 103 (02/07 0501) Resp:  [18-20] 18 (02/07 0501) BP: (91-130)/(42-71) 130/62 mmHg (02/07 0501) SpO2:  [62 %-97 %] 94 % (02/07 0650)  Intake/Output from previous day: 02/06 0701 - 02/07 0700 In: 710 [P.O.:360; Blood:350] Out: 200 [Urine:200] Intake/Output this shift: Total I/O In: 360 [P.O.:360] Out: -   Exam:  Neurovascular intact Sensation intact distally Intact pulses distally  Labs:  Recent Labs  10/08/13 1555 10/09/13 0517  HGB 8.6* 7.8*    Recent Labs  10/08/13 1555 10/09/13 0517  WBC 15.8* 11.1*  RBC 3.11* 2.76*  HCT 26.7* 23.5*  PLT 230 200   No results found for this basename: NA, K, CL, CO2, BUN, CREATININE, GLUCOSE, CALCIUM,  in the last 72 hours No results found for this basename: LABPT, INR,  in the last 72 hours  Assessment/Plan: Dc today   Stacey Bowman SCOTT 10/10/2013, 10:56 AM

## 2013-10-10 NOTE — Progress Notes (Signed)
Physical Therapy Treatment Patient Details Name: Stacey Bowman MRN: [id number] DOB: July 11, 1941 Today's Date: 10/10/2013 Time: 4098-1191 PT Time Calculation (min): 36 min  PT Assessment / Plan / Recommendation  History of Present Illness Pt is s/p direct anterior R THA; Recovery is complicated by hypoxia   PT Comments   Pt progressing slowly towards physical therapy goals. PT continues to recommend SNF at d/c, however pt and family are refusing STR and expect to be d/c home this afternoon. Pt on 2L/min supplemental O2 throughout session, and at times O2 saturation was as low as 76%. Pt refused to correctly perform pursed-lip breathing, stating she likes to do it "her way", and O2 saturation remained <85% for a majority of the session. RN notified prior to therapist leaving room of oxygen status.  Follow Up Recommendations  SNF;Supervision/Assistance - 24 hour;Other (comment)     Does the patient have the potential to tolerate intense rehabilitation     Barriers to Discharge        Equipment Recommendations  None recommended by PT    Recommendations for Other Services    Frequency 7X/week   Progress towards PT Goals Progress towards PT goals: Progressing toward goals  Plan Current plan remains appropriate    Precautions / Restrictions Precautions Precautions: Fall Restrictions Weight Bearing Restrictions: Yes RLE Weight Bearing: Weight bearing as tolerated   Pertinent Vitals/Pain See above for O2, pt reports pain as "medium", as she states she doesn't understand the 0-10 scale.    Mobility  Bed Mobility Overal bed mobility: Needs Assistance Bed Mobility: Supine to Sit;Sit to Supine Supine to sit: Min guard;HOB elevated Sit to supine: Min guard;HOB elevated General bed mobility comments: Pt sitting on EOB on arrival--reports she sat up by herself Transfers Overall transfer level: Needs assistance Equipment used: Rolling walker (2 wheeled) Transfers: Sit to/from Stand Sit  to Stand: Min guard Stand pivot transfers: Min assist General transfer comment: VC's for hand placement on seated surface for safety.  Ambulation/Gait Ambulation/Gait assistance: Min guard Ambulation Distance (Feet): 25 Feet Assistive device: Rolling walker (2 wheeled) Gait Pattern/deviations: Step-through pattern;Decreased stride length;Trunk flexed;Narrow base of support Gait velocity: Decreased Gait velocity interpretation: Below normal speed for age/gender General Gait Details: Pt ambulated on 2L/min supplemental O2 with VC's for pursed-lip breathing (pt made no corrective changes).  Stairs:  (Pt declined as she is planning on using w/c to enter home)    Exercises Total Joint Exercises Ankle Circles/Pumps: 10 reps Quad Sets: 10 reps Short Arc Quad: 10 reps Heel Slides: 10 reps Hip ABduction/ADduction: 10 reps Long Arc Quad: 10 reps   PT Diagnosis:    PT Problem List:   PT Treatment Interventions:     PT Goals (current goals can now be found in the care plan section) Acute Rehab PT Goals Patient Stated Goal: home PT Goal Formulation: With patient Time For Goal Achievement: 10/14/13 Potential to Achieve Goals: Good  Visit Information  Last PT Received On: 10/10/13 Assistance Needed: +1 History of Present Illness: Pt is s/p direct anterior R THA; Recovery is complicated by hypoxia    Subjective Data  Subjective: Pt agreeable to therapy as she wants to d/c this afternoon Patient Stated Goal: home   Cognition  Cognition Arousal/Alertness: Awake/alert Behavior During Therapy: WFL for tasks assessed/performed Overall Cognitive Status: Within Functional Limits for tasks assessed    Balance  Balance Overall balance assessment: Needs assistance Sitting-balance support: Feet supported;Bilateral upper extremity supported Sitting balance-Leahy Scale: Good Standing balance support: Bilateral upper  extremity supported Standing balance-Leahy Scale: Fair  End of Session PT -  End of Session Equipment Utilized During Treatment: Gait belt;Oxygen Activity Tolerance: Patient limited by fatigue Patient left: in bed;with call bell/phone within reach Nurse Communication: Mobility status   GP     Jolyn Lent 10/10/2013, 4:08 PM  Jolyn Lent, [place], DPT [phone]

## 2013-10-12 ENCOUNTER — Encounter (HOSPITAL_COMMUNITY): Payer: Self-pay | Admitting: Orthopaedic Surgery

## 2014-10-20 ENCOUNTER — Encounter: Payer: Self-pay | Admitting: Gastroenterology

## 2015-04-13 ENCOUNTER — Encounter: Payer: Self-pay | Admitting: Gastroenterology

## 2015-08-11 DIAGNOSIS — R911 Solitary pulmonary nodule: Secondary | ICD-10-CM

## 2015-08-11 DIAGNOSIS — Z9189 Other specified personal risk factors, not elsewhere classified: Secondary | ICD-10-CM

## 2015-08-11 HISTORY — DX: Other specified personal risk factors, not elsewhere classified: Z91.89

## 2015-08-11 HISTORY — DX: Solitary pulmonary nodule: R91.1

## 2015-09-30 DIAGNOSIS — R0602 Shortness of breath: Secondary | ICD-10-CM

## 2015-09-30 DIAGNOSIS — Z8542 Personal history of malignant neoplasm of other parts of uterus: Secondary | ICD-10-CM

## 2015-09-30 DIAGNOSIS — M160 Bilateral primary osteoarthritis of hip: Secondary | ICD-10-CM

## 2015-09-30 DIAGNOSIS — J439 Emphysema, unspecified: Secondary | ICD-10-CM

## 2015-09-30 DIAGNOSIS — D649 Anemia, unspecified: Secondary | ICD-10-CM

## 2015-09-30 DIAGNOSIS — Z9981 Dependence on supplemental oxygen: Secondary | ICD-10-CM

## 2015-09-30 DIAGNOSIS — J449 Chronic obstructive pulmonary disease, unspecified: Secondary | ICD-10-CM

## 2015-11-11 ENCOUNTER — Encounter: Payer: Self-pay | Admitting: Vascular Surgery

## 2015-11-15 ENCOUNTER — Encounter: Payer: Self-pay | Admitting: Vascular Surgery

## 2015-11-15 ENCOUNTER — Ambulatory Visit (INDEPENDENT_AMBULATORY_CARE_PROVIDER_SITE_OTHER): Payer: Medicare Other | Admitting: Vascular Surgery

## 2015-11-15 VITALS — BP 108/62 | HR 72 | Temp 97.6°F | Resp 16 | Ht 62.5 in | Wt 136.0 lb

## 2015-11-15 DIAGNOSIS — I6521 Occlusion and stenosis of right carotid artery: Secondary | ICD-10-CM | POA: Diagnosis not present

## 2015-11-15 DIAGNOSIS — I6529 Occlusion and stenosis of unspecified carotid artery: Secondary | ICD-10-CM | POA: Insufficient documentation

## 2015-11-15 NOTE — Progress Notes (Signed)
Subjective:     Patient ID: Stacey Bowman, female   DOB: 07/10/1941, 75 y.o.   MRN: [id number]  HPI this 75 year old female was referred by Dr. 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 She was scheduled have cataract surgery on the right eye in December 2016. She suffered an acute loss of a portion of her vision in the right eye 2 days prior to scheduled surgery. She was referred to a retinal specialist and he noted a retinal embolus on his evaluation. Patient had an ultrasound study which revealed a 50-69% right ICA stenosis and the patient was referred here for further evaluation. She has no history of right brain or left brain stroke TIAs lateralizing weakness and aphasia or previous amaurosis fugax or syncope. She takes one aspirin per day. She has no history of cardiac arrhythmias or myocardial infarction.  Past Medical History  Diagnosis Date  . DDD (degenerative disc disease)   . Chronic bronchitis     was on Dexamethasone and has been off for surgery  . 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 Lyme disease 2013  . 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 DJD (degenerative joint disease)   . GERD (gastroesophageal reflux disease)     takes Omeprazole daily  . Emphysema     uses Advair inhaler bid and Albuterol prn  . Unspecified essential hypertension     takes Ramipril,Catapres,and Metoprolol daily  . Muscle spasms of lower extremity     takes Flexeril daily as needed  . ADD (attention deficit disorder)     takes Ritalin daily  . OCD (obsessive compulsive disorder)   . Depression     takes Prozac daily  . Dysrhythmia     Rt BBB and Tacycardia  . Emphysema   . Shortness of breath     with exertion  . History of migraine   . Short-term memory loss   . Neuropathy ([place])   . Joint pain   . Joint swelling   . Back pain     stenosis  . Osteoporosis   . History of colon polyps   . Urinary frequency   . UTI (lower urinary tract  infection) hx of    was on a Sulfa med-placed on it 08-05-13 as precaution for surgery  . Urinary urgency   . Anemia     iron deficiency  . History of staph infection 2013  . On home oxygen therapy   . Headache(784.0)     migraines stopped with menopause  . History of blood transfusion     Social History  Substance Use Topics  . Smoking status: Former Smoker    Types: Cigarettes    Quit date: 09/04/1995  . Smokeless tobacco: Never Used     Comment: quit in 1997  . Alcohol Use: No     Comment: recovering alcoholic, quit in 0000000    Family History  Problem Relation Age of Onset  . Cervical cancer Maternal Aunt   . Emphysema Mother   . Diabetes Father   . Diabetes Maternal Grandmother   . Heart disease Mother   . Heart disease Maternal Grandmother   . Lung disease Brother     No Known Allergies   Current outpatient prescriptions:  .  albuterol (PROVENTIL HFA;VENTOLIN HFA) 108 (90 BASE) MCG/ACT inhaler, Inhale 2 puffs into the lungs as needed for wheezing or shortness of breath., Disp: , Rfl:  .  ammonium lactate (  LAC-HYDRIN) 12 % lotion, Apply 1 application topically 3 (three) times a week., Disp: , Rfl:  .  aspirin EC 325 MG EC tablet, Take 1 tablet (325 mg total) by mouth daily with breakfast., Disp: 30 tablet, Rfl: 0 .  benzonatate (TESSALON) 100 MG capsule, Take 100 mg by mouth 2 (two) times daily as needed for cough., Disp: , Rfl:  .  CARAFATE 1 GM/10ML suspension, Take 2 g by mouth daily. , Disp: , Rfl:  .  celecoxib (CELEBREX) 200 MG capsule, Take 200 mg by mouth 2 (two) times daily., Disp: , Rfl:  .  cloNIDine (CATAPRES) 0.1 MG tablet, Take 0.1 mg by mouth 2 (two) times daily. , Disp: , Rfl:  .  clotrimazole (MYCELEX) 10 MG troche, Take 10 mg by mouth every 4 (four) hours as needed., Disp: , Rfl:  .  cyclobenzaprine (FLEXERIL) 10 MG tablet, Take 10 mg by mouth at bedtime as needed for muscle spasms. May also take 3 x a week if needed., Disp: , Rfl:  .   diphenhydrAMINE (BENADRYL) 25 mg capsule, Take 25 mg by mouth every 6 (six) hours as needed (cough)., Disp: , Rfl:  .  FLUoxetine (PROZAC) 40 MG capsule, Take 40 mg by mouth daily. , Disp: , Rfl:  .  fluticasone (FLONASE) 50 MCG/ACT nasal spray, Place 1 spray into both nostrils daily as needed for allergies or rhinitis., Disp: , Rfl:  .  gabapentin (NEURONTIN) 300 MG capsule, Take 900 mg by mouth 3 (three) times daily. , Disp: , Rfl:  .  guaifenesin (HUMIBID E) 400 MG TABS tablet, Take 400 mg by mouth 2 (two) times daily as needed (cough)., Disp: , Rfl:  .  methylphenidate (RITALIN) 10 MG tablet, Take 10 mg by mouth 2 (two) times daily., Disp: , Rfl:  .  metoprolol tartrate (LOPRESSOR) 25 MG tablet, Take 25 mg by mouth daily. , Disp: , Rfl:  .  mupirocin ointment (BACTROBAN) 2 %, Apply 1 application topically daily as needed (sores on legs)., Disp: , Rfl:  .  nystatin (MYCOSTATIN) 100000 UNIT/ML suspension, Use as directed 5 mLs in the mouth or throat 3 (three) times daily. Reported on 11/15/2015, Disp: , Rfl:  .  nystatin cream (MYCOSTATIN), Apply 1 application topically 3 (three) times daily as needed for dry skin., Disp: , Rfl:  .  omeprazole (PRILOSEC) 20 MG capsule, Take 20 mg by mouth daily., Disp: , Rfl:  .  oxyCODONE (OXY IR/ROXICODONE) 5 MG immediate release tablet, Take 0.5-2 tablets (2.5-10 mg total) by mouth daily., Disp: 60 tablet, Rfl: 0 .  oxyCODONE-acetaminophen (ROXICET) 5-325 MG per tablet, Take 1-2 tablets by mouth every 4 (four) hours as needed for severe pain., Disp: 60 tablet, Rfl: 0 .  polyethylene glycol (MIRALAX / GLYCOLAX) packet, Take 17 g by mouth daily as needed for mild constipation., Disp: , Rfl:  .  prochlorperazine (COMPAZINE) 10 MG tablet, Take 10 mg by mouth every 4 (four) hours as needed for nausea or vomiting., Disp: , Rfl:  .  Prochlorperazine Maleate (COMPAZINE PO), Take 1 tablet by mouth daily as needed (nausea)., Disp: , Rfl:  .  promethazine (PHENERGAN) 25 MG  suppository, Place 25 mg rectally every 6 (six) hours as needed for nausea or vomiting., Disp: , Rfl:  .  Vitamin D, Ergocalciferol, (DRISDOL) 50000 UNITS CAPS capsule, Take 50,000 Units by mouth every 7 (seven) days. Wednesday, Disp: , Rfl:  .  docusate sodium (COLACE) 100 MG capsule, Take 100 mg by mouth daily  as needed for mild constipation (when taking any pain medication). Reported on 11/15/2015, Disp: , Rfl:  .  Fluticasone-Salmeterol (ADVAIR) 250-50 MCG/DOSE AEPB, Inhale 1 puff into the lungs daily. Reported on 11/15/2015, Disp: , Rfl:  .  guaifenesin (ROBITUSSIN) 100 MG/5ML syrup, Take 300 mg by mouth 4 (four) times daily as needed for cough. Reported on 11/15/2015, Disp: , Rfl:  .  ramipril (ALTACE) 5 MG capsule, Take 5 mg by mouth daily. Reported on 11/15/2015, Disp: , Rfl:   Filed Vitals:   11/15/15 1102  BP: 108/62  Pulse: 72  Temp: 97.6 F (36.4 C)  Resp: 16  Height: 5' 2.5" (1.588 m)  Weight: 136 lb (61.689 kg)  SpO2: 91%    Body mass index is 24.46 kg/(m^2).         Review of Systems and has history of Lyme disease, idiopathic pulmonary fibrosis, is on home oxygen, has dyspnea on exertion, denies chest pain, large cavity claudication, excessive compulsive disorder see history of present illness. Other systems negative and complete review of systems    Objective:   Physical Exam BP 108/62 mmHg  Pulse 72  Temp(Src) 97.6 F (36.4 C)  Resp 16  Ht 5' 2.5" (1.588 m)  Wt 136 lb (61.689 kg)  BMI 24.46 kg/m2  SpO2 91%  Gen.-alert and oriented x3 in no apparent distress HEENT normal for age Lungs no rhonchi or wheezing Cardiovascular regular rhythm no murmurs carotid pulses 3+ palpable no bruits audible Abdomen soft nontender no palpable masses Musculoskeletal free of  major deformities Skin clear -no rashes Neurologic normal Lower extremities 3+ femoral and dorsalis pedis pulses palpable bilaterally with no edema  Day I reviewed the records supplied by Welford Roche  and the report of the carotid ultrasound which revealed velocities in the right internal carotid of 147/25 with some plaque visualized in the internal carotid artery. 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

## 2015-11-15 NOTE — Addendum Note (Signed)
Addended by: Mena Goes on: 11/15/2015 01:09 PM   Modules accepted: Orders

## 2015-11-16 ENCOUNTER — Encounter: Payer: Self-pay | Admitting: Gerontology

## 2015-11-23 ENCOUNTER — Encounter: Payer: Self-pay | Admitting: Vascular Surgery

## 2015-11-29 ENCOUNTER — Encounter: Payer: Self-pay | Admitting: Vascular Surgery

## 2015-11-29 ENCOUNTER — Other Ambulatory Visit: Payer: Self-pay

## 2015-11-29 ENCOUNTER — Ambulatory Visit (INDEPENDENT_AMBULATORY_CARE_PROVIDER_SITE_OTHER): Payer: Medicare Other | Admitting: Vascular Surgery

## 2015-11-29 VITALS — BP 113/65 | HR 72 | Temp 97.8°F | Resp 18 | Ht 62.0 in | Wt 133.0 lb

## 2015-11-29 DIAGNOSIS — I6521 Occlusion and stenosis of right carotid artery: Secondary | ICD-10-CM | POA: Diagnosis not present

## 2015-11-29 NOTE — Progress Notes (Signed)
Subjective:     Patient ID: Stacey Bowman, female   DOB: [birth year], 75 y.o.   MRN: [id number]  HPI This 75 year old female returns to discuss the findings of her CT angiogram of the neck which was performed a few weeks ago as an outpatient. Patient suffered a right retinal embolus which was documented by her ophthalmologist an retinal specialist. She has a moderate right ICA stenosis. I have reviewed the CT angiogram and she does have an ulcerated right ICA stenosis approximating 50-60% with a kinked internal carotid distal to this. Patient has had no further symptoms since her retinal embolus 3 months ago with loss of some of the vision in her right eye. She has no previous history of stroke. She does have COPD and is on home oxygen but has been quite stable for the last few years. She has no history of coronary artery disease and does not smoke.  Past Medical History  Diagnosis Date  . DDD (degenerative disc disease)   . Chronic bronchitis     was on Dexamethasone and has been off for surgery  . 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 Lyme disease 2013  . 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 DJD (degenerative joint disease)   . GERD (gastroesophageal reflux disease)     takes Omeprazole daily  . Emphysema     uses Advair inhaler bid and Albuterol prn  . Unspecified essential hypertension     takes Ramipril,Catapres,and Metoprolol daily  . Muscle spasms of lower extremity     takes Flexeril daily as needed  . ADD (attention deficit disorder)     takes Ritalin daily  . OCD (obsessive compulsive disorder)   . Depression     takes Prozac daily  . Dysrhythmia     Rt BBB and Tacycardia  . Emphysema   . Shortness of breath     with exertion  . History of migraine   . Short-term memory loss   . Neuropathy ([place])   . Joint pain   . Joint swelling   . Back pain     stenosis  . Osteoporosis   . History of colon polyps   . Urinary frequency   .  UTI (lower urinary tract infection) hx of    was on a Sulfa med-placed on it 08-05-13 as precaution for surgery  . Urinary urgency   . Anemia     iron deficiency  . History of staph infection 2013  . On home oxygen therapy   . Headache(784.0)     migraines stopped with menopause  . History of blood transfusion     Social History  Substance Use Topics  . Smoking status: Former Smoker    Types: Cigarettes    Quit date: 09/04/1995  . Smokeless tobacco: Never Used     Comment: quit in 1997  . Alcohol Use: No     Comment: recovering alcoholic, quit in 0000000    Family History  Problem Relation Age of Onset  . Cervical cancer Maternal Aunt   . Emphysema Mother   . Diabetes Father   . Diabetes Maternal Grandmother   . Heart disease Mother   . Heart disease Maternal Grandmother   . Lung disease Brother     No Known Allergies   Current outpatient prescriptions:  .  albuterol (PROVENTIL HFA;VENTOLIN HFA) 108 (90 BASE) MCG/ACT inhaler, Inhale 2 puffs into the lungs as needed for wheezing or shortness  of breath., Disp: , Rfl:  .  ammonium lactate (LAC-HYDRIN) 12 % lotion, Apply 1 application topically 3 (three) times a week., Disp: , Rfl:  .  aspirin EC 325 MG EC tablet, Take 1 tablet (325 mg total) by mouth daily with breakfast., Disp: 30 tablet, Rfl: 0 .  benzonatate (TESSALON) 100 MG capsule, Take 100 mg by mouth 2 (two) times daily as needed for cough., Disp: , Rfl:  .  CARAFATE 1 GM/10ML suspension, Take 2 g by mouth daily. , Disp: , Rfl:  .  celecoxib (CELEBREX) 200 MG capsule, Take 200 mg by mouth 2 (two) times daily., Disp: , Rfl:  .  cloNIDine (CATAPRES) 0.1 MG tablet, Take 0.1 mg by mouth 2 (two) times daily. , Disp: , Rfl:  .  clotrimazole (MYCELEX) 10 MG troche, Take 10 mg by mouth every 4 (four) hours as needed., Disp: , Rfl:  .  cyclobenzaprine (FLEXERIL) 10 MG tablet, Take 10 mg by mouth at bedtime as needed for muscle spasms. May also take 3 x a week if needed., Disp: ,  Rfl:  .  diphenhydrAMINE (BENADRYL) 25 mg capsule, Take 25 mg by mouth every 6 (six) hours as needed (cough)., Disp: , Rfl:  .  docusate sodium (COLACE) 100 MG capsule, Take 100 mg by mouth daily as needed for mild constipation (when taking any pain medication). Reported on 11/15/2015, Disp: , Rfl:  .  FLUoxetine (PROZAC) 40 MG capsule, Take 40 mg by mouth daily. , Disp: , Rfl:  .  fluticasone (FLONASE) 50 MCG/ACT nasal spray, Place 1 spray into both nostrils daily as needed for allergies or rhinitis., Disp: , Rfl:  .  Fluticasone-Salmeterol (ADVAIR) 250-50 MCG/DOSE AEPB, Inhale 1 puff into the lungs daily. Reported on 11/15/2015, Disp: , Rfl:  .  gabapentin (NEURONTIN) 300 MG capsule, Take 900 mg by mouth 3 (three) times daily. , Disp: , Rfl:  .  guaifenesin (HUMIBID E) 400 MG TABS tablet, Take 400 mg by mouth 2 (two) times daily as needed (cough)., Disp: , Rfl:  .  guaifenesin (ROBITUSSIN) 100 MG/5ML syrup, Take 300 mg by mouth 4 (four) times daily as needed for cough. Reported on 11/15/2015, Disp: , Rfl:  .  methylphenidate (RITALIN) 10 MG tablet, Take 10 mg by mouth 2 (two) times daily., Disp: , Rfl:  .  metoprolol tartrate (LOPRESSOR) 25 MG tablet, Take 25 mg by mouth daily. , Disp: , Rfl:  .  mupirocin ointment (BACTROBAN) 2 %, Apply 1 application topically daily as needed (sores on legs)., Disp: , Rfl:  .  nystatin (MYCOSTATIN) 100000 UNIT/ML suspension, Use as directed 5 mLs in the mouth or throat 3 (three) times daily. Reported on 11/15/2015, Disp: , Rfl:  .  nystatin cream (MYCOSTATIN), Apply 1 application topically 3 (three) times daily as needed for dry skin., Disp: , Rfl:  .  omeprazole (PRILOSEC) 20 MG capsule, Take 20 mg by mouth daily., Disp: , Rfl:  .  oxyCODONE (OXY IR/ROXICODONE) 5 MG immediate release tablet, Take 0.5-2 tablets (2.5-10 mg total) by mouth daily., Disp: 60 tablet, Rfl: 0 .  oxyCODONE-acetaminophen (ROXICET) 5-325 MG per tablet, Take 1-2 tablets by mouth every 4 (four)  hours as needed for severe pain., Disp: 60 tablet, Rfl: 0 .  polyethylene glycol (MIRALAX / GLYCOLAX) packet, Take 17 g by mouth daily as needed for mild constipation., Disp: , Rfl:  .  prochlorperazine (COMPAZINE) 10 MG tablet, Take 10 mg by mouth every 4 (four) hours as needed for  nausea or vomiting., Disp: , Rfl:  .  Prochlorperazine Maleate (COMPAZINE PO), Take 1 tablet by mouth daily as needed (nausea)., Disp: , Rfl:  .  promethazine (PHENERGAN) 25 MG suppository, Place 25 mg rectally every 6 (six) hours as needed for nausea or vomiting., Disp: , Rfl:  .  ramipril (ALTACE) 5 MG capsule, Take 5 mg by mouth daily. Reported on 11/15/2015, Disp: , Rfl:  .  Vitamin D, Ergocalciferol, (DRISDOL) 50000 UNITS CAPS capsule, Take 50,000 Units by mouth every 7 (seven) days. Wednesday, Disp: , Rfl:   Filed Vitals:   11/29/15 1121  BP: 113/65  Pulse: 72  Temp: 97.8 F (36.6 C)  Resp: 18  Height: 5\' 2"  (1.575 m)  Weight: 133 lb (60.328 kg)  SpO2: 99%    Body mass index is 24.32 kg/(m^2).           Review of Systems Denies active chest pain, hemoptysis, recent pneumonia. Does have COPD on home oxygen quite stable     Objective:   Physical Exam BP 113/65 mmHg  Pulse 72  Temp(Src) 97.8 F (36.6 C)  Resp 18  Ht 5\' 2"  (1.575 m)  Wt 133 lb (60.328 kg)  BMI 24.32 kg/m2  SpO2 99%   well-developed well-nourished female no apparent distress alert and oriented 3 on nasal oxen Lungs no rhonchi or wheezing Cardiovascular regular rhythm no murmurs  carotid pulses 3+ Neurologic exam normal    CT angiogram performed a few weeks ago was reviewed by me by computer and does reveal an irregular ulcerated right ICA stenosis with a kinked internal carotid distal to this point     Assessment:      history of right retinal embolus documented by retinal specialist with loss of portion of vision right eye  Moderate right ICA stenosisulcerated with kinked right ICA distal to this  COPD on home  oxygenstable     Plan:      have recommended right carotid endarterectomy with resection of redundant artery and removal of  Kinked segment. Patient would like to proceed Wrist and benefits thoroughly discussed with patient lives scheduled for Friday, April 7

## 2015-12-01 ENCOUNTER — Encounter: Payer: Self-pay | Admitting: Vascular Surgery

## 2015-12-05 ENCOUNTER — Telehealth: Payer: Self-pay

## 2015-12-05 NOTE — Telephone Encounter (Signed)
Stacey Bowman called to cancel her surgery that is scheduled for Friday, 12/09/15. She is scheduled for right carotid endarterectomy with Dr. Kellie Simmering. Stacey Bowman states she does not want to reschedule surgery at this time. She states she has no transportation until sometime in May and will call back once she has dates available. Will make Dr. Kellie Simmering aware.

## 2015-12-09 ENCOUNTER — Inpatient Hospital Stay (HOSPITAL_COMMUNITY): Admission: RE | Admit: 2015-12-09 | Payer: Medicare Other | Source: Ambulatory Visit | Admitting: Vascular Surgery

## 2015-12-09 ENCOUNTER — Encounter (HOSPITAL_COMMUNITY): Admission: RE | Payer: Self-pay | Source: Ambulatory Visit

## 2015-12-09 SURGERY — ENDARTERECTOMY, CAROTID
Anesthesia: General | Laterality: Right

## 2016-05-02 DIAGNOSIS — I10 Essential (primary) hypertension: Secondary | ICD-10-CM

## 2016-05-02 DIAGNOSIS — Z8669 Personal history of other diseases of the nervous system and sense organs: Secondary | ICD-10-CM

## 2016-05-02 HISTORY — DX: Personal history of other diseases of the nervous system and sense organs: Z86.69

## 2016-05-02 HISTORY — DX: Essential (primary) hypertension: I10

## 2016-06-25 ENCOUNTER — Telehealth (INDEPENDENT_AMBULATORY_CARE_PROVIDER_SITE_OTHER): Payer: Self-pay | Admitting: Physical Medicine and Rehabilitation

## 2016-06-25 NOTE — Telephone Encounter (Signed)
Yes ok 

## 2016-06-25 NOTE — Telephone Encounter (Signed)
Scheduled for 07/02/16 at 330 with driver and no blood thinners.

## 2016-07-02 ENCOUNTER — Ambulatory Visit (INDEPENDENT_AMBULATORY_CARE_PROVIDER_SITE_OTHER): Payer: Medicare Other | Admitting: Physical Medicine and Rehabilitation

## 2016-07-02 ENCOUNTER — Encounter (INDEPENDENT_AMBULATORY_CARE_PROVIDER_SITE_OTHER): Payer: Self-pay | Admitting: Physical Medicine and Rehabilitation

## 2016-07-02 VITALS — BP 112/57 | HR 74 | Temp 97.8°F

## 2016-07-02 DIAGNOSIS — M5416 Radiculopathy, lumbar region: Secondary | ICD-10-CM | POA: Diagnosis not present

## 2016-07-02 DIAGNOSIS — M48061 Spinal stenosis, lumbar region without neurogenic claudication: Secondary | ICD-10-CM

## 2016-07-02 MED ORDER — METHYLPREDNISOLONE ACETATE 80 MG/ML IJ SUSP
80.0000 mg | Freq: Once | INTRAMUSCULAR | Status: AC
  Administered 2016-07-02: 80 mg

## 2016-07-02 MED ORDER — LIDOCAINE HCL (PF) 1 % IJ SOLN
0.3300 mL | Freq: Once | INTRAMUSCULAR | Status: AC
  Administered 2016-07-02: 0.3 mL

## 2016-07-02 NOTE — Procedures (Signed)
Lumbar Epidural Steroid Injection - Interlaminar Approach with Fluoroscopic Guidance  Patient: Stacey Bowman      Date of Birth: 01/12/1941 MRN: [id number] PCP: PROVIDER NOT IN SYSTEM      Visit Date: 07/02/2016   Universal Protocol:    Date/Time: 10/30/174:03 PM  Consent Given By: the patient  Position: PRONE  Additional Comments: Vital signs were monitored before and after the procedure. Patient was prepped and draped in the usual sterile fashion. The correct patient, procedure, and site was verified.   Injection Procedure Details:  Procedure Site One Meds Administered:  Meds ordered this encounter  Medications  . lidocaine (PF) (XYLOCAINE) 1 % injection 0.3 mL  . methylPREDNISolone acetate (DEPO-MEDROL) injection 80 mg     Laterality: Right  Location/Site:  L5-S1  Needle size: 20 G  Needle type: Tuohy  Needle Placement: Paramedian epidural  Findings:  -Contrast Used: 2 mL iohexol 180 mg iodine/mL   -Comments: Excellent flow of contrast into the epidural space.  Procedure Details: Using a paramedian approach from the side mentioned above, the region overlying the inferior lamina was localized under fluoroscopic visualization and the soft tissues overlying this structure were infiltrated with 4 ml. of 1% Lidocaine without Epinephrine. The Tuohy needle was inserted into the epidural space using a paramedian approach.   The epidural space was localized using loss of resistance along with lateral and bi-planar fluoroscopic views.  After negative aspirate for air, blood, and CSF, a 2 ml. volume of Isovue-250 was injected into the epidural space and the flow of contrast was observed. Radiographs were obtained for documentation purposes.    The injectate was administered into the level noted above.   Additional Comments:  The patient tolerated the procedure well Dressing: Band-Aid    Post-procedure details: Patient was observed during the  procedure. Post-procedure instructions were reviewed.  Patient left the clinic in stable condition.

## 2016-07-02 NOTE — Patient Instructions (Signed)

## 2016-07-02 NOTE — Progress Notes (Signed)
Office Visit Note  Patient: Stacey Bowman           Date of Birth: 1941/08/06           MRN: [id number] Visit Date: 07/02/2016              Requested by: No referring provider defined for this encounter. PCP: PROVIDER NOT IN SYSTEM   Assessment & Plan: Visit Diagnoses:  1. Lumbar radiculopathy   2. Spinal stenosis of lumbar region without neurogenic claudication     Follow-Up Instructions: Return if symptoms worsen or fail to improve in 2 weeks, for Recheck spine.  Orders:  Orders Placed This Encounter  Procedures  . Epidural Steroid injection    Meds ordered this encounter  Medications  . lidocaine (PF) (XYLOCAINE) 1 % injection 0.3 mL  . methylPREDNISolone acetate (DEPO-MEDROL) injection 80 mg      Procedures: Lumbar Epidural Steroid Injection - Interlaminar Approach with Fluoroscopic Guidance  Patient: Stacey Bowman      Date of Birth: [birth year] MRN: [id number] PCP: PROVIDER NOT IN SYSTEM      Visit Date: 07/02/2016   Universal Protocol:    Date/Time: 10/30/174:03 PM  Consent Given By: the patient  Position: PRONE  Additional Comments: Vital signs were monitored before and after the procedure. Patient was prepped and draped in the usual sterile fashion. The correct patient, procedure, and site was verified.   Injection Procedure Details:  Procedure Site One Meds Administered:  Meds ordered this encounter  Medications  . lidocaine (PF) (XYLOCAINE) 1 % injection 0.3 mL  . methylPREDNISolone acetate (DEPO-MEDROL) injection 80 mg     Laterality: Right  Location/Site:  L5-S1  Needle size: 20 G  Needle type: Tuohy  Needle Placement: Paramedian epidural  Findings:  -Contrast Used: 2 mL iohexol 180 mg iodine/mL   -Comments: Excellent flow of contrast into the epidural space.  Procedure Details: Using a paramedian approach from the side mentioned above, the region overlying the inferior lamina was localized under fluoroscopic visualization  and the soft tissues overlying this structure were infiltrated with 4 ml. of 1% Lidocaine without Epinephrine. The Tuohy needle was inserted into the epidural space using a paramedian approach.   The epidural space was localized using loss of resistance along with lateral and bi-planar fluoroscopic views.  After negative aspirate for air, blood, and CSF, a 2 ml. volume of Isovue-250 was injected into the epidural space and the flow of contrast was observed. Radiographs were obtained for documentation purposes.    The injectate was administered into the level noted above.   Additional Comments:  The patient tolerated the procedure well Dressing: Band-Aid    Post-procedure details: Patient was observed during the procedure. Post-procedure instructions were reviewed.  Patient left the clinic in stable condition.    Other Procedures: No procedures performed   Clinical Data: No additional findings.   Subjective: Chief Complaint  Patient presents with  . Lower Back - Pain    HPI Did well with last injection in August. States she had around 50 % relief for several weeks. Increased pain x 1 week. Pain across back into right buttock with standing and walking. "pinching pain" Denies pain down leg. No pain with sitting or lying.She has known lateral recessed stenosis at L4-5 impacting the L5 nerve root on the right. She has intermittent flareups and we haven't seen her in quite a while. We are repeat this today.  Blood thinners- 81 mg aspirin No dye allergy Has  driver today-Wayne Drema [place]  Review of Systems   Objective: Vital Signs: BP (!) 112/57 (BP Location: Left Arm, Patient Position: Sitting, Cuff Size: Normal)   Pulse 74   Temp 97.8 F (36.6 C) (Oral)   SpO2 92%    Physical Exam General appearance: NAD, conversant  Psych: Appropriate affect, alert and oriented to person, place and time  Eyes: anicteric sclerae, moist conjunctivae; no lid-lag; PERRLA Lungs: normal  respiratory effort and no intercostal retractions, no wheezing she is wearing a nasal cannula with portable oxygen. CVA: normal pulses Extremities: No peripheral edema  Skin: Normal temperature, turgor and texture; no rash, ulcers or subcutaneous nodules MSK:/Neuro:   On manual muscle testing there is 5/5 strength in the distal muscle groups of the lower extremities bilaterally without deficits. There is no clonus test bilaterally.   Ortho Exam  Specialty Comments:  No specialty comments available. Imaging: No results found.   PMFS History: Patient Active Problem List   Diagnosis Date Noted  . Carotid artery stenosis, symptomatic 11/15/2015  . Arthritis of right hip 10/06/2013  . Degenerative arthritis of left hip 08/18/2013  . 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  . Iron deficiency anemia, unspecified 07/15/2013  . Endometrial ca [facility]) 07/15/2013   Past Medical History:  Diagnosis Date  . ADD (attention deficit disorder)    takes Ritalin daily  . Anemia    iron deficiency  . Back pain    stenosis  . Chronic bronchitis    was on Dexamethasone and has been off for surgery  . DDD (degenerative disc disease)   . Depression    takes Prozac daily  . DJD (degenerative joint disease)   . Dysrhythmia    Rt BBB and Tacycardia  . Emphysema    uses Advair inhaler bid and Albuterol prn  . Emphysema   . 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 GERD (gastroesophageal reflux disease)    takes Omeprazole daily  . Headache(784.0)    migraines stopped with menopause  . History of blood transfusion   . History of colon polyps   . History of migraine   . History of staph infection 2013  . Joint pain   . Joint swelling   . Lyme disease 2013  . Muscle spasms of lower extremity    takes Flexeril daily as needed  . Neuropathy ([place])   . OCD (obsessive compulsive disorder)   . On home oxygen therapy   . Osteoporosis   . 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 Short-term memory loss   . Shortness of breath    with exertion  . Unspecified essential hypertension    takes Ramipril,Catapres,and Metoprolol daily  . Urinary frequency   . Urinary urgency   . UTI (lower urinary tract infection) hx of   was on a Sulfa med-placed on it 08-05-13 as precaution for surgery    Family History  Problem Relation Age of Onset  . Cervical cancer Maternal Aunt   . Emphysema Mother   . Diabetes Father   . Diabetes Maternal Grandmother   . Heart disease Mother   . Heart disease Maternal Grandmother   . Lung disease Brother    Past Surgical History:  Procedure Laterality Date  . ABDOMINAL HYSTERECTOMY    . CHOLECYSTECTOMY    . COLONOSCOPY    . JOINT REPLACEMENT Left 08/19/13   hip  . Porta catheter Right 2011  . right arm surgery  fracure repair  . right arm surgery    . TOTAL ABDOMINAL HYSTERECTOMY W/ BILATERAL SALPINGOOPHORECTOMY    . TOTAL HIP ARTHROPLASTY Left 08/18/2013   Procedure: LEFT TOTAL HIP ARTHROPLASTY ANTERIOR APPROACH;  Surgeon: Mcarthur Rossetti, MD;  Location: [place];  Service: Orthopedics;  Laterality: Left;  . TOTAL HIP ARTHROPLASTY Right 10/06/2013   DR Ninfa Linden  . TOTAL HIP ARTHROPLASTY Right 10/06/2013   Procedure: RIGHT TOTAL HIP ARTHROPLASTY ANTERIOR APPROACH;  Surgeon: Mcarthur Rossetti, MD;  Location: [place];  Service: Orthopedics;  Laterality: Right;  . WRIST SURGERY Right    ligament repair   Social History   Occupational History  . retired    Social History Main Topics  . Smoking status: Former Smoker    Types: Cigarettes    Quit date: 09/04/1995  . Smokeless tobacco: Never Used     Comment: quit in 1997  . Alcohol use No     Comment: recovering alcoholic, quit in 0000000  . Drug use: No  . Sexual activity: Not on file     Comment: quit 1997

## 2016-07-16 ENCOUNTER — Telehealth (INDEPENDENT_AMBULATORY_CARE_PROVIDER_SITE_OTHER): Payer: Self-pay | Admitting: Physical Medicine and Rehabilitation

## 2016-07-16 NOTE — Telephone Encounter (Signed)
I called the patient and left a message for her to call for scheduling.

## 2016-07-16 NOTE — Telephone Encounter (Signed)
Right L4 and L5 TF esi

## 2016-07-17 NOTE — Telephone Encounter (Signed)
No precert required for medicare or mail handlers

## 2016-07-30 ENCOUNTER — Encounter (INDEPENDENT_AMBULATORY_CARE_PROVIDER_SITE_OTHER): Payer: Self-pay | Admitting: Physical Medicine and Rehabilitation

## 2016-07-30 ENCOUNTER — Ambulatory Visit (INDEPENDENT_AMBULATORY_CARE_PROVIDER_SITE_OTHER): Payer: Medicare Other | Admitting: Physical Medicine and Rehabilitation

## 2016-07-30 VITALS — BP 116/47 | HR 66

## 2016-07-30 DIAGNOSIS — G8929 Other chronic pain: Secondary | ICD-10-CM | POA: Diagnosis not present

## 2016-07-30 DIAGNOSIS — M47816 Spondylosis without myelopathy or radiculopathy, lumbar region: Secondary | ICD-10-CM

## 2016-07-30 DIAGNOSIS — M545 Low back pain: Secondary | ICD-10-CM | POA: Diagnosis not present

## 2016-07-30 MED ORDER — LIDOCAINE HCL (PF) 1 % IJ SOLN: 0.3300 mL | Freq: Once | INTRAMUSCULAR | Status: DC

## 2016-07-30 MED ORDER — METHYLPREDNISOLONE ACETATE 80 MG/ML IJ SUSP
80.0000 mg | Freq: Once | INTRAMUSCULAR | Status: AC
  Administered 2016-07-30: 80 mg

## 2016-07-30 NOTE — Procedures (Signed)
Lumbar Facet Joint Intra-Articular Injection(s) with Fluoroscopic Guidance  Patient: Stacey Bowman      Date of Birth: [birth year] MRN: [id number] PCP: PROVIDER NOT IN SYSTEM      Visit Date: 07/30/2016   Universal Protocol:    Date/Time: 11/27/173:16 PM  Consent Given By: the patient  Position: PRONE   Additional Comments: Vital signs were monitored before and after the procedure. Patient was prepped and draped in the usual sterile fashion. The correct patient, procedure, and site was verified.   Injection Procedure Details:  Procedure Site One Meds Administered:  Meds ordered this encounter  Medications  . lidocaine (PF) (XYLOCAINE) 1 % injection 0.3 mL  . methylPREDNISolone acetate (DEPO-MEDROL) injection 80 mg     Laterality: Bilateral  Location/Site:  L4-L5  Needle size: 22 guage  Needle type: Spinal  Needle Placement: Articular  Findings:  -Contrast Used: 1 mL iohexol 180 mg iodine/mL   -Comments: Excellent flow of contrast producing a partial arthrogram.  Procedure Details: The fluoroscope beam is vertically oriented in AP, and the inferior recess is visualized beneath the lower pole of the inferior apophyseal process, which represents the target point for needle insertion. When direct visualization is difficult the target point is located at the medial projection of the vertebral pedicle. The region overlying each aforementioned target is locally anesthetized with a 1 to 2 ml. volume of 1% Lidocaine without Epinephrine.   The spinal needle was inserted into each of the above mentioned facet joints using biplanar fluoroscopic guidance. A 0.25 to 0.5 ml. volume of Isovue-250 was injected and a partial facet joint arthrogram was obtained. A single spot film was obtained of the resulting arthrogram.    One to 1.25 ml of the steroid/anesthetic solution was then injected into each of the facet joints noted above.   Additional Comments:  The patient tolerated the  procedure well Dressing: Band-Aid    Post-procedure details: Patient was observed during the procedure. Post-procedure instructions were reviewed.  Patient left the clinic in stable condition.

## 2016-07-30 NOTE — Patient Instructions (Signed)

## 2016-07-30 NOTE — Progress Notes (Signed)
Stacey Bowman - 75 y.o. female MRN [id number]  Date of birth: April 19, 1941  Office Visit Note: Visit Date: 07/30/2016 PCP: PROVIDER NOT IN SYSTEM Referred by: No ref. provider found  Subjective: Chief Complaint  Patient presents with  . Lower Back - Pain   HPI: [name] is a 75 year old female who we seen a couple of times for epidural injection. The first epidural injection was very beneficial for more hip pain. Since that time she now has pain across back. Right = Left. Pain with walking and standing. No pain with sitting or laying. Denies leg pain or numbness and tingling. The epidural injection was not very effective. As discussed prior to that I do think the facet arthropathy she has particular L4-5 as a source a lot of the back pain.    ROS Otherwise per HPI.  Assessment & Plan: Visit Diagnoses:  1. Spondylosis without myelopathy or radiculopathy, lumbar region   2. Chronic bilateral low back pain without sciatica     Plan: Findings:  Bilateral L4-5 facet joint injection/block with fluoroscopic guidance. Please see our prior evaluation and management note for further details and justification.    Meds & Orders:  Meds ordered this encounter  Medications  . lidocaine (PF) (XYLOCAINE) 1 % injection 0.3 mL  . methylPREDNISolone acetate (DEPO-MEDROL) injection 80 mg    Orders Placed This Encounter  Procedures  . Nerve Block    Follow-up: Return if symptoms worsen or fail to improve, after two weeks.   Procedures: No procedures performed  Lumbar Facet Joint Intra-Articular Injection(s) with Fluoroscopic Guidance  Patient: Stacey Bowman      Date of Birth: 1940-11-11 MRN: [id number] PCP: PROVIDER NOT IN SYSTEM      Visit Date: 07/30/2016   Universal Protocol:    Date/Time: 11/27/173:16 PM  Consent Given By: the patient  Position: PRONE   Additional Comments: Vital signs were monitored before and after the procedure. Patient was prepped and draped in the  usual sterile fashion. The correct patient, procedure, and site was verified.   Injection Procedure Details:  Procedure Site One Meds Administered:  Meds ordered this encounter  Medications  . lidocaine (PF) (XYLOCAINE) 1 % injection 0.3 mL  . methylPREDNISolone acetate (DEPO-MEDROL) injection 80 mg     Laterality: Bilateral  Location/Site:  L4-L5  Needle size: 22 guage  Needle type: Spinal  Needle Placement: Articular  Findings:  -Contrast Used: 1 mL iohexol 180 mg iodine/mL   -Comments: Excellent flow of contrast producing a partial arthrogram.  Procedure Details: The fluoroscope beam is vertically oriented in AP, and the inferior recess is visualized beneath the lower pole of the inferior apophyseal process, which represents the target point for needle insertion. When direct visualization is difficult the target point is located at the medial projection of the vertebral pedicle. The region overlying each aforementioned target is locally anesthetized with a 1 to 2 ml. volume of 1% Lidocaine without Epinephrine.   The spinal needle was inserted into each of the above mentioned facet joints using biplanar fluoroscopic guidance. A 0.25 to 0.5 ml. volume of Isovue-250 was injected and a partial facet joint arthrogram was obtained. A single spot film was obtained of the resulting arthrogram.    One to 1.25 ml of the steroid/anesthetic solution was then injected into each of the facet joints noted above.   Additional Comments:  The patient tolerated the procedure well Dressing: Band-Aid    Post-procedure details: Patient was observed during the procedure. Post-procedure  instructions were reviewed.  Patient left the clinic in stable condition.     Clinical History: No specialty comments available.  She reports that she quit smoking about 20 years ago. Her smoking use included Cigarettes. She has never used smokeless tobacco. No results for input(s): HGBA1C, LABURIC in the  last 8760 hours.  Objective:  VS:  HT:    WT:   BMI:     BP:(!) 116/47  HR:66bpm  TEMP: ( )  RESP:98 % Physical Exam  Constitutional: She is oriented to person, place, and time. She appears well-developed and well-nourished.  Eyes: Conjunctivae and EOM are normal. Pupils are equal, round, and reactive to light.  Cardiovascular: Normal rate and intact distal pulses.   Pulmonary/Chest: Effort normal.  The patient is on portable oxygen via nasal cannula  Musculoskeletal:  The patient ambulates with a walker. She has pain with extension rotation but good distal strength.  Neurological: She is alert and oriented to person, place, and time. No sensory deficit.  Skin: Skin is warm and dry. No rash noted. No erythema.  Psychiatric: She has a normal mood and affect. Her behavior is normal.  Nursing note and vitals reviewed.   Ortho Exam Imaging: No results found.  Past Medical/Family/Surgical/Social History: Medications & Allergies reviewed per EMR Patient Active Problem List   Diagnosis Date Noted  . Carotid artery stenosis, symptomatic 11/15/2015  . Arthritis of right hip 10/06/2013  . Degenerative arthritis of left hip 08/18/2013  . 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  . Iron deficiency anemia, unspecified 07/15/2013  . Endometrial ca [facility]) 07/15/2013   Past Medical History:  Diagnosis Date  . ADD (attention deficit disorder)    takes Ritalin daily  . Anemia    iron deficiency  . Back pain    stenosis  . Chronic bronchitis    was on Dexamethasone and has been off for surgery  . DDD (degenerative disc disease)   . Depression    takes Prozac daily  . DJD (degenerative joint disease)   . Dysrhythmia    Rt BBB and Tacycardia  . Emphysema    uses Advair inhaler bid and Albuterol prn  . Emphysema   . 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 GERD (gastroesophageal reflux disease)    takes Omeprazole daily  . Headache(784.0)    migraines stopped with  menopause  . History of blood transfusion   . History of colon polyps   . History of migraine   . History of staph infection 2013  . Joint pain   . Joint swelling   . Lyme disease 2013  . Muscle spasms of lower extremity    takes Flexeril daily as needed  . Neuropathy ([place])   . OCD (obsessive compulsive disorder)   . On home oxygen therapy   . Osteoporosis   . 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 Short-term memory loss   . Shortness of breath    with exertion  . Unspecified essential hypertension    takes Ramipril,Catapres,and Metoprolol daily  . Urinary frequency   . Urinary urgency   . UTI (lower urinary tract infection) hx of   was on a Sulfa med-placed on it 08-05-13 as precaution for surgery   Family History  Problem Relation Age of Onset  . Cervical cancer Maternal Aunt   . Emphysema Mother   . Diabetes Father   . Diabetes Maternal Grandmother   . Heart disease Mother   .  Heart disease Maternal Grandmother   . Lung disease Brother    Past Surgical History:  Procedure Laterality Date  . ABDOMINAL HYSTERECTOMY    . CHOLECYSTECTOMY    . COLONOSCOPY    . JOINT REPLACEMENT Left 08/19/13   hip  . Porta catheter Right 2011  . right arm surgery     fracure repair  . right arm surgery    . TOTAL ABDOMINAL HYSTERECTOMY W/ BILATERAL SALPINGOOPHORECTOMY    . TOTAL HIP ARTHROPLASTY Left 08/18/2013   Procedure: LEFT TOTAL HIP ARTHROPLASTY ANTERIOR APPROACH;  Surgeon: Mcarthur Rossetti, MD;  Location: [place];  Service: Orthopedics;  Laterality: Left;  . TOTAL HIP ARTHROPLASTY Right 10/06/2013   DR Ninfa Linden  . TOTAL HIP ARTHROPLASTY Right 10/06/2013   Procedure: RIGHT TOTAL HIP ARTHROPLASTY ANTERIOR APPROACH;  Surgeon: Mcarthur Rossetti, MD;  Location: [place];  Service: Orthopedics;  Laterality: Right;  . WRIST SURGERY Right    ligament repair   Social History   Occupational History  . retired    Social History Main Topics    . Smoking status: Former Smoker    Types: Cigarettes    Quit date: 09/04/1995  . Smokeless tobacco: Never Used     Comment: quit in 1997  . Alcohol use No     Comment: recovering alcoholic, quit in 0000000  . Drug use: No  . Sexual activity: Not on file     Comment: quit 1997

## 2016-10-16 ENCOUNTER — Telehealth: Payer: Self-pay | Admitting: Cardiovascular Disease

## 2016-10-16 NOTE — Telephone Encounter (Signed)
Received records from [facility] for appointment on 11/14/16 with Dr Claiborne Billings.  Records put with Dr Evette Georges schedule for 11/14/16. lp

## 2016-11-14 ENCOUNTER — Ambulatory Visit (INDEPENDENT_AMBULATORY_CARE_PROVIDER_SITE_OTHER): Payer: Medicare Other | Admitting: Cardiovascular Disease

## 2016-11-14 ENCOUNTER — Encounter: Payer: Self-pay | Admitting: Cardiovascular Disease

## 2016-11-14 VITALS — BP 139/75 | HR 67 | Ht 62.0 in | Wt 145.4 lb

## 2016-11-14 DIAGNOSIS — I779 Disorder of arteries and arterioles, unspecified: Secondary | ICD-10-CM

## 2016-11-14 DIAGNOSIS — R0789 Other chest pain: Secondary | ICD-10-CM

## 2016-11-14 DIAGNOSIS — R0609 Other forms of dyspnea: Secondary | ICD-10-CM

## 2016-11-14 DIAGNOSIS — Z79899 Other long term (current) drug therapy: Secondary | ICD-10-CM | POA: Diagnosis not present

## 2016-11-14 DIAGNOSIS — I209 Angina pectoris, unspecified: Secondary | ICD-10-CM

## 2016-11-14 DIAGNOSIS — I10 Essential (primary) hypertension: Secondary | ICD-10-CM

## 2016-11-14 DIAGNOSIS — J432 Centrilobular emphysema: Secondary | ICD-10-CM | POA: Diagnosis not present

## 2016-11-14 DIAGNOSIS — I739 Peripheral vascular disease, unspecified: Secondary | ICD-10-CM

## 2016-11-14 MED ORDER — ASPIRIN 81 MG PO TABS: 81.0000 mg | ORAL_TABLET | Freq: Every day | ORAL | Status: DC

## 2016-11-14 MED ORDER — AMLODIPINE BESYLATE 5 MG PO TABS: 5.0000 mg | ORAL_TABLET | Freq: Every day | ORAL | 6 refills | Status: DC

## 2016-11-14 NOTE — Patient Instructions (Signed)
Medication Instructions:  1.) start new prescription for Amlodipine 5 mg. This has been sent to your pharmacy. 2.) Decrease the aspirin from 325 mg to 81 mg daily.  Labwork:  CBC, C-MET, LIPIDS, TSH  Testing/Procedures: Your physician has requested that you have an echocardiogram. Echocardiography is a painless test that uses sound waves to create images of your heart. It provides your doctor with information about the size and shape of your heart and how well your heart's chambers and valves are working. This procedure takes approximately one hour. There are no restrictions for this procedure. THIS WILL BE DONE AT THE [address] LOCATION.  Your physician has requested that you have a lexiscan myoview. For further information please visit [email or web address]. Please follow instruction sheet, as given.     Follow-Up:  4 weeks with Dr Claiborne Billings.  Any Other Special Instructions Will Be Listed Below (If Applicable).

## 2016-11-14 NOTE — Progress Notes (Signed)
Cardiology Office Note    Date:  11/14/2016   ID:  Stacey Bowman, DOB 09/28/1940, MRN [id number]  PCP:  Kathi Ludwig, [facility] Cardiologist:  Shelva Majestic, MD   Chief Complaint  Patient presents with  . New Patient (Initial Visit)    History of Present Illness:  Stacey Bowman is a 76 y.o. female his referred through the courtesy of Kathi Ludwig for cardiology evaluation.  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  She developed right eye visual changes.  In early 2017 and a CT and she'll of her neck demonstrated complex soft and calcified plaque at the bifurcation of the right internal carotid artery and bulb with stenosis of at least 60%.  There was plaque noted in her left carotid system, not felt to be significant.  She was also found to have calcified plaque at the left subclavian artery.  There was moderate to severe centrilobular emphysema.  Most recently, she has been maintained on metoprolol 25 mg daily, clonidine 0.1 mg twice a day, and losartan 25 mg daily.  Over the last several months, she has noticed exertionally precipitated chest tightness in 6 weeks ago, was given a prescription for some we will nitroglycerin and has taken this on 2 occasions with benefit.  She also admits to exertional shortness of breath.  She has a remote history of Lyme disease, history of GERD,.  She is now referred for cardiac evaluation.  Past Medical History:  Diagnosis Date  . ADD (attention deficit disorder)    takes Ritalin daily  . Anemia    iron deficiency  . Back pain    stenosis  . Chronic bronchitis    was on Dexamethasone and has been off for surgery  . DDD (degenerative disc disease)   . Depression    takes Prozac daily  . DJD (degenerative joint disease)   . Dysrhythmia    Rt BBB and  Tacycardia  . Emphysema    uses Advair inhaler bid and Albuterol prn  . Emphysema   . 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 GERD (gastroesophageal reflux disease)    takes Omeprazole daily  . Headache(784.0)    migraines stopped with menopause  . History of blood transfusion   . History of colon polyps   . History of migraine   . History of staph infection 2013  . Joint pain   . Joint swelling   . Lyme disease 2013  . Muscle spasms of lower extremity    takes Flexeril daily as needed  . Neuropathy ([place])   . OCD (obsessive compulsive disorder)   . On home oxygen therapy   . Osteoporosis   . 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 Short-term memory loss   . Shortness of breath    with exertion  . Unspecified essential hypertension    takes Ramipril,Catapres,and Metoprolol daily  . Urinary frequency   . Urinary urgency   . UTI (lower urinary tract infection) hx of   was on a Sulfa med-placed on it 08-05-13 as precaution for surgery    Past Surgical History:  Procedure Laterality Date  . ABDOMINAL HYSTERECTOMY    . CHOLECYSTECTOMY    . COLONOSCOPY    . JOINT REPLACEMENT Left 08/19/13   hip  . Porta catheter  Right 2011  . right arm surgery     fracure repair  . right arm surgery    . TOTAL ABDOMINAL HYSTERECTOMY W/ BILATERAL SALPINGOOPHORECTOMY    . TOTAL HIP ARTHROPLASTY Left 08/18/2013   Procedure: LEFT TOTAL HIP ARTHROPLASTY ANTERIOR APPROACH;  Surgeon: Mcarthur Rossetti, MD;  Location: [facility];  Service: Orthopedics;  Laterality: Left;  . TOTAL HIP ARTHROPLASTY Right 10/06/2013   DR Ninfa Linden  . TOTAL HIP ARTHROPLASTY Right 10/06/2013   Procedure: RIGHT TOTAL HIP ARTHROPLASTY ANTERIOR APPROACH;  Surgeon: Mcarthur Rossetti, MD;  Location: [facility];  Service: Orthopedics;  Laterality: Right;  . WRIST SURGERY Right    ligament repair    Current Medications: Outpatient Medications Prior to Visit  Medication Sig Dispense  Refill  . albuterol (PROVENTIL HFA;VENTOLIN HFA) 108 (90 BASE) MCG/ACT inhaler Inhale 2 puffs into the lungs as needed for wheezing or shortness of breath.    Marland Kitchen ammonium lactate (LAC-HYDRIN) 12 % lotion Apply 1 application topically 3 (three) times a week.    . benzonatate (TESSALON) 100 MG capsule Take 100 mg by mouth 2 (two) times daily as needed for cough.    Marland Kitchen CARAFATE 1 GM/10ML suspension Take 2 g by mouth daily.     . celecoxib (CELEBREX) 200 MG capsule Take 200 mg by mouth 2 (two) times daily.    . cloNIDine (CATAPRES) 0.1 MG tablet Take 0.1 mg by mouth 2 (two) times daily.     . clotrimazole (MYCELEX) 10 MG troche Take 10 mg by mouth every 4 (four) hours as needed.    . cyclobenzaprine (FLEXERIL) 10 MG tablet Take 10 mg by mouth at bedtime as needed for muscle spasms. May also take 3 x a week if needed.    . diphenhydrAMINE (BENADRYL) 25 mg capsule Take 25 mg by mouth every 6 (six) hours as needed (cough).    . docusate sodium (COLACE) 100 MG capsule Take 100 mg by mouth daily as needed for mild constipation (when taking any pain medication). Reported on 11/15/2015    . FLUoxetine (PROZAC) 40 MG capsule Take 40 mg by mouth daily.     . fluticasone (FLONASE) 50 MCG/ACT nasal spray Place 1 spray into both nostrils daily as needed for allergies or rhinitis.    . Fluticasone-Salmeterol (ADVAIR) 250-50 MCG/DOSE AEPB Inhale 1 puff into the lungs daily. Reported on 11/15/2015    . gabapentin (NEURONTIN) 300 MG capsule Take 900 mg by mouth 3 (three) times daily.     Marland Kitchen guaifenesin (HUMIBID E) 400 MG TABS tablet Take 400 mg by mouth 2 (two) times daily as needed (cough).    Marland Kitchen guaifenesin (ROBITUSSIN) 100 MG/5ML syrup Take 300 mg by mouth 4 (four) times daily as needed for cough. Reported on 11/15/2015    . methylphenidate (RITALIN) 10 MG tablet Take 10 mg by mouth 2 (two) times daily.    . metoprolol tartrate (LOPRESSOR) 25 MG tablet Take 25 mg by mouth daily.     . mupirocin ointment (BACTROBAN) 2 %  Apply 1 application topically daily as needed (sores on legs).    . nystatin (MYCOSTATIN) 100000 UNIT/ML suspension Use as directed 5 mLs in the mouth or throat 3 (three) times daily. Reported on 11/15/2015    . nystatin cream (MYCOSTATIN) Apply 1 application topically 3 (three) times daily as needed for dry skin.    Marland Kitchen omeprazole (PRILOSEC) 20 MG capsule Take 20 mg by mouth daily.    Marland Kitchen oxyCODONE (OXY IR/ROXICODONE) 5 MG immediate  release tablet Take 0.5-2 tablets (2.5-10 mg total) by mouth daily. 60 tablet 0  . oxyCODONE-acetaminophen (ROXICET) 5-325 MG per tablet Take 1-2 tablets by mouth every 4 (four) hours as needed for severe pain. 60 tablet 0  . polyethylene glycol (MIRALAX / GLYCOLAX) packet Take 17 g by mouth daily as needed for mild constipation.    . prochlorperazine (COMPAZINE) 10 MG tablet Take 10 mg by mouth every 4 (four) hours as needed for nausea or vomiting.    Marland Kitchen Prochlorperazine Maleate (COMPAZINE PO) Take 1 tablet by mouth daily as needed (nausea).    . promethazine (PHENERGAN) 25 MG suppository Place 25 mg rectally every 6 (six) hours as needed for nausea or vomiting.    . Vitamin D, Ergocalciferol, (DRISDOL) 50000 UNITS CAPS capsule Take 50,000 Units by mouth every 7 (seven) days. Wednesday    . aspirin EC 325 MG EC tablet Take 1 tablet (325 mg total) by mouth daily with breakfast. 30 tablet 0  . ramipril (ALTACE) 5 MG capsule Take 5 mg by mouth daily. Reported on 11/15/2015     Facility-Administered Medications Prior to Visit  Medication Dose Route Frequency Provider Last Rate Last Dose  . lidocaine (PF) (XYLOCAINE) 1 % injection 0.3 mL  0.3 mL Other Once Magnus Sinning, MD         Allergies:   Patient has no known allergies.   Social History   Social History  . Marital status: Widowed    Spouse name: N/A  . Number of children: 0  . Years of education: N/A   Occupational History  . retired    Social History Main Topics  . Smoking status: Former Smoker    Types:  Cigarettes    Quit date: 09/04/1995  . Smokeless tobacco: Never Used     Comment: quit in 1997  . Alcohol use No     Comment: recovering alcoholic, quit in 6812  . Drug use: No  . Sexual activity: Not Asked     Comment: quit 1997   Other Topics Concern  . None   Social History Narrative  . None     Family History:   family history includes Cervical cancer in her maternal aunt; Diabetes in her father and maternal grandmother; Emphysema in her mother; Heart disease in her maternal grandmother and mother; Lung disease in her brother.   ROS General: Negative; No fevers, chills, or night sweats;  HEENT: Negative; No changes in vision or hearing, sinus congestion, difficulty swallowing Pulmonary: Negative; No cough, wheezing, shortness of breath, hemoptysis Cardiovascular: Negative; No chest pain, presyncope, syncope, palpitations GI: Negative; No nausea, vomiting, diarrhea, or abdominal pain GU: Negative; No dysuria, hematuria, or difficulty voiding Musculoskeletal: History of hip replacements in 2014.  In 2015 Hematologic/Oncology: Negative; no easy bruising, bleeding Endocrine: Negative; no heat/cold intolerance; no diabetes Neuro: Positive for ADD; Negative; no changes in balance, headaches Skin: Negative; No rashes or skin lesions Psychiatric: Negative; No behavioral problems, depression Sleep: Negative; No snoring, daytime sleepiness, hypersomnolence, bruxism, restless legs, hypnogognic hallucinations, no cataplexy Other comprehensive 14 point system review is negative.   PHYSICAL EXAM:   VS:  BP 139/75   Pulse 67   Ht '5\' 2"'  (1.575 m)   Wt 145 lb 6.4 oz (66 kg)   BMI 26.59 kg/m     Repeat blood pressure by me 144/74 Wt Readings from Last 3 Encounters:  11/14/16 145 lb 6.4 oz (66 kg)  11/29/15 133 lb (60.3 kg)  11/15/15 136 lb (61.7  kg)    General: Alert, oriented, no distress.  Skin: normal turgor, no rashes, warm and dry HEENT: Normocephalic, atraumatic. Pupils  equal round and reactive to light; sclera anicteric; extraocular muscles intact; Fundi Without hemorrhages or exudates. Nose without nasal septal hypertrophy Mouth/Parynx benign; Mallinpatti scale 2 Neck: No JVD, no carotid bruits; normal carotid upstroke Lungs: clear to ausculatation and percussion; no wheezing or rales Chest wall: without tenderness to palpitation Heart: PMI not displaced, RRR, s1 s2 normal, 1/6 systolic murmur, no diastolic murmur, no rubs, gallops, thrills, or heaves Abdomen: soft, nontender; no hepatosplenomehaly, BS+; abdominal aorta nontender and not dilated by palpation. Back: no CVA tenderness Pulses 2+ Musculoskeletal: full range of motion, normal strength, no joint deformities Extremities: no clubbing cyanosis or edema, Homan's sign negative  Neurologic: grossly nonfocal; Cranial nerves grossly wnl Psychologic: Normal mood and affect   Studies/Labs Reviewed:   EKG:  EKG is ordered today.  ECG (independently read by me): Normal sinus rhythm at 67 bpm.  Right bundle-branch block with repolarization changes.  Wandering baseline.  Recent Labs: BMP Latest Ref Rng & Units 10/07/2013 09/30/2013 08/19/2013  Glucose 70 - 99 mg/dL 132(H) 110(H) 122(H)  BUN 6 - 23 mg/dL 19 21 25(H)  Creatinine 0.50 - 1.10 mg/dL 0.60 0.58 0.59  Sodium 137 - 147 mEq/L 140 141 130(L)  Potassium 3.7 - 5.3 mEq/L 3.9 4.1 3.6  Chloride 96 - 112 mEq/L 101 102 97  CO2 19 - 32 mEq/L '26 24 24  ' Calcium 8.4 - 10.5 mg/dL 7.8(L) 8.9 7.2(L)     Hepatic Function Latest Ref Rng & Units 02/06/2010  Total Protein 6.0 - 8.3 g/dL 6.9  Albumin 3.5 - 5.2 g/dL 4.3  AST 0 - 37 U/L 20  ALT 0 - 35 U/L 16  Alk Phosphatase 39 - 117 U/L 38(L)  Total Bilirubin 0.3 - 1.2 mg/dL 0.4    CBC Latest Ref Rng & Units 10/09/2013 10/08/2013 10/07/2013  WBC 4.0 - 10.5 K/uL 11.1(H) 15.8(H) 11.0(H)  Hemoglobin 12.0 - 15.0 g/dL 7.8(L) 8.6(L) 9.6(L)  Hematocrit 36.0 - 46.0 % 23.5(L) 26.7(L) 29.7(L)  Platelets 150 - 400 K/uL 200  230 206   Lab Results  Component Value Date   MCV 85.1 10/09/2013   MCV 85.9 10/08/2013   MCV 85.3 10/07/2013   No results found for: TSH No results found for: HGBA1C   BNP No results found for: BNP  ProBNP No results found for: PROBNP   Lipid Panel  No results found for: CHOL, TRIG, HDL, CHOLHDL, VLDL, LDLCALC, LDLDIRECT   RADIOLOGY: No results found.   Additional studies/ records that were reviewed today include:  I have reviewed the records from [place], [place].  I reviewed previous carotid Doppler study as well as CT scan.    ASSESSMENT:    1. Chest tightness   2. Angina pectoris ([place])   3. Carotid disease, bilateral ([place])   4. Essential hypertension, benign   5. Medication management   6. Exertional dyspnea   7. Centrilobular emphysema ([facility])      PLAN:  Ms. Tamkia Temples is a 76 year old female who has a history of hypertension, peripheral neuropathy, GERD, attention deficit disorder, and has documented atherosclerosis involving her coronary arteries as well as aorta and subclavian vessels.  She has a remote tobacco history but fortunately quit in 1997, but unfortunately has significant centrilobular emphysema and pulmonary fibrosis for which she is now on continuous supplemental oxygen therapy.  She has the recent development  of exertionally precipitated chest pressure and when she has taken several nitroglycerin on 2 occasions.  She has noticed benefit.  She is hypertensive today on her current medical regimen consisting of losartan 25 mg, low-dose metoprolol, and clonidine.  I am electing to add amlodipine 5 mg, which should be helpful both for blood pressure control as well as as anti-ischemic benefit.  I'm scheduling her for an echo Doppler study to evaluate systolic and diastolic function.  She will undergo a [place] study to assess for potential ischemia.  A complete set of fasting laboratory will be obtained  and she will require aggressive lipid-lowering therapy in attempt to induce plaque regression.  If at all possible.  I recommended she reduce her aspirin from 325 mg down to 81 mg.  She is not having any GERD symptoms but continues to take omeprazole as needed.  I will see her in 4 weeks following the above studies and further recommendations will be made at that time.   Medication Adjustments/Labs and Tests Ordered: Current medicines are reviewed at length with the patient today.  Concerns regarding medicines are outlined above.  Medication changes, Labs and Tests ordered today are listed in the Patient Instructions below. Patient Instructions  Medication Instructions:  1.) start new prescription for Amlodipine 5 mg. This has been sent to your pharmacy. 2.) Decrease the aspirin from 325 mg to 81 mg daily.  Labwork:  CBC, C-MET, LIPIDS, TSH  Testing/Procedures: Your physician has requested that you have an echocardiogram. Echocardiography is a painless test that uses sound waves to create images of your heart. It provides your doctor with information about the size and shape of your heart and how well your heart's chambers and valves are working. This procedure takes approximately one hour. There are no restrictions for this procedure. THIS WILL BE DONE AT THE [address] LOCATION.  Your physician has requested that you have a lexiscan myoview. For further information please visit [email or web address]. Please follow instruction sheet, as given.     Follow-Up:  4 weeks with Dr Claiborne Billings.  Any Other Special Instructions Will Be Listed Below (If Applicable).      Signed, Shelva Majestic, MD  11/14/2016 2:52 PM    [facility] [address] [place], [place], [place]  [address] Phone: [phone]

## 2016-11-15 ENCOUNTER — Telehealth (HOSPITAL_COMMUNITY): Payer: Self-pay | Admitting: Cardiovascular Disease

## 2016-11-19 NOTE — Telephone Encounter (Signed)
11/15/2016 02:55 PM Phone ([place]) Bowman, Stacey H (Self) [phone] (H)   Left Message - Called pt and lmsg for her to CB to get scheduled for both an echo and myoview.    By Verdene Rio    11/16/2016 02:30 PM Phone ([address]) Bowman, Stacey H (Self) [phone] (H)   Left Message - Called pt and lmsg for her to CB to get scheduled for both an echo and myoview.     By Verdene Rio      I spoke with patient on the afternoon of 3/16 and she voiced that she was scheduled for the Echo and Myoview at [facility].

## 2016-12-05 ENCOUNTER — Encounter: Payer: Self-pay | Admitting: Cardiovascular Disease

## 2016-12-06 ENCOUNTER — Telehealth: Payer: Self-pay | Admitting: Cardiovascular Disease

## 2016-12-06 NOTE — Telephone Encounter (Signed)
Received records from [facility] for appointment on 12/14/16 with Dr Claiborne Billings.  Records put with Dr Evette Georges schedule on 12/14/16. lp

## 2016-12-14 ENCOUNTER — Encounter: Payer: Self-pay | Admitting: Cardiovascular Disease

## 2016-12-14 ENCOUNTER — Ambulatory Visit (INDEPENDENT_AMBULATORY_CARE_PROVIDER_SITE_OTHER): Payer: Medicare Other | Admitting: Cardiovascular Disease

## 2016-12-14 DIAGNOSIS — D509 Iron deficiency anemia, unspecified: Secondary | ICD-10-CM

## 2016-12-14 DIAGNOSIS — I358 Other nonrheumatic aortic valve disorders: Secondary | ICD-10-CM | POA: Diagnosis not present

## 2016-12-14 DIAGNOSIS — E039 Hypothyroidism, unspecified: Secondary | ICD-10-CM | POA: Diagnosis not present

## 2016-12-14 DIAGNOSIS — E785 Hyperlipidemia, unspecified: Secondary | ICD-10-CM | POA: Diagnosis not present

## 2016-12-14 DIAGNOSIS — I10 Essential (primary) hypertension: Secondary | ICD-10-CM

## 2016-12-14 DIAGNOSIS — J432 Centrilobular emphysema: Secondary | ICD-10-CM

## 2016-12-14 DIAGNOSIS — R0789 Other chest pain: Secondary | ICD-10-CM | POA: Diagnosis not present

## 2016-12-14 DIAGNOSIS — I6521 Occlusion and stenosis of right carotid artery: Secondary | ICD-10-CM

## 2016-12-14 DIAGNOSIS — R5383 Other fatigue: Secondary | ICD-10-CM | POA: Diagnosis not present

## 2016-12-14 MED ORDER — ROSUVASTATIN CALCIUM 10 MG PO TABS: 10.0000 mg | ORAL_TABLET | Freq: Every day | ORAL | 3 refills | Status: DC

## 2016-12-14 MED ORDER — CLOPIDOGREL BISULFATE 75 MG PO TABS: 75.0000 mg | ORAL_TABLET | Freq: Every day | ORAL | 3 refills | Status: DC

## 2016-12-14 MED ORDER — LEVOTHYROXINE SODIUM 50 MCG PO TABS: 50.0000 ug | ORAL_TABLET | Freq: Every day | ORAL | 3 refills | Status: DC

## 2016-12-14 NOTE — Progress Notes (Signed)
Cardiology Office Note    Date:  12/17/2016   ID:  Stacey Bowman, DOB 01-Sep-1941, MRN [id number]  PCP:  Kathi Ludwig, [facility] Cardiologist:  Shelva Majestic, MD   Chief Complaint  Patient presents with  . Follow-up    No chest pain, has shortness of breath, no edema, no pain or cramping in legs, occassional lightheaded or dizziness     History of Present Illness:  Stacey Bowman is a 76 y.o. female who I initially saw for cardiology evaluation on 11/14/2016 through the courtesy of Kathi Ludwig for cardiology evaluation.  She presents now for follow-up evaluation.  Ms. 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  She developed right eye visual changes.  In early 2017 and a CT and she'll of her neck demonstrated complex soft and calcified plaque at the bifurcation of the right internal carotid artery and bulb with stenosis of at least 60%.  There was plaque noted in her left carotid system, not felt to be significant.  She was also found to have calcified plaque at the left subclavian artery.  There was moderate to severe centrilobular emphysema.  She has been maintained on metoprolol 25 mg daily, clonidine 0.1 mg twice a day, and losartan 25 mg daily.  Over the last several months, she has noticed exertionally precipitated chest tightness in 6 weeks ago, was given a prescription for sl nitroglycerin and has taken this on 2 occasions with benefit.  She also admits to exertional shortness of breath.  She has a remote history of Lyme disease, history of GERD.  Initially saw her, she was hypertensive on her medical regimen and I added amlodipine 5 mg, which would be helpful both for blood pressure control as well as anti-ischemic benefit.  I commended she reduce her aspirin to 81 mg.  Recommended that she undergo an  echo Doppler study and a Lexiscan Myoview study.  These were done at [facility], which showed fusion without scar or ischemia.  Her echo Doppler study showed an EF of 65-70% with mildly impaired relaxation.  There was aortic sclerosis with mild aortic regurgitation.  Tricuspid regurgitation.  She underwent laboratory at [facility].  Function was normal.  154, LDL 82, triglycerides 159, 31.8 and HDL 42.  TSH was increased to 12.0.  Mild anemia with a hemoglobin of 11.4, hematocrit of 34.2.  She presents for follow-up evaluation.  Past Medical History:  Diagnosis Date  . ADD (attention deficit disorder)    takes Ritalin daily  . Anemia    iron deficiency  . Back pain    stenosis  . Chronic bronchitis    was on Dexamethasone and has been off for surgery  . DDD (degenerative disc disease)   . Depression    takes Prozac daily  . DJD (degenerative joint disease)   . Dysrhythmia    Rt BBB and Tacycardia  . Emphysema    uses Advair inhaler bid and Albuterol prn  . Emphysema   . 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 GERD (gastroesophageal reflux disease)    takes Omeprazole daily  . Headache(784.0)    migraines stopped with menopause  . History of blood transfusion   . History of colon polyps   . History of migraine   . History of staph infection 2013  . Joint pain   .  Joint swelling   . Lyme disease 2013  . Muscle spasms of lower extremity    takes Flexeril daily as needed  . Neuropathy   . OCD (obsessive compulsive disorder)   . On home oxygen therapy   . Osteoporosis   . 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 Short-term memory loss   . Shortness of breath    with exertion  . Unspecified essential hypertension    takes Ramipril,Catapres,and Metoprolol daily  . Urinary frequency   . Urinary urgency   . UTI (lower urinary tract infection) hx of   was on a Sulfa med-placed on it 08-05-13 as precaution for surgery    Past  Surgical History:  Procedure Laterality Date  . ABDOMINAL HYSTERECTOMY    . CHOLECYSTECTOMY    . COLONOSCOPY    . JOINT REPLACEMENT Left 08/19/13   hip  . Porta catheter Right 2011  . right arm surgery     fracure repair  . right arm surgery    . TOTAL ABDOMINAL HYSTERECTOMY W/ BILATERAL SALPINGOOPHORECTOMY    . TOTAL HIP ARTHROPLASTY Left 08/18/2013   Procedure: LEFT TOTAL HIP ARTHROPLASTY ANTERIOR APPROACH;  Surgeon: Mcarthur Rossetti, MD;  Location: [place];  Service: Orthopedics;  Laterality: Left;  . TOTAL HIP ARTHROPLASTY Right 10/06/2013   DR Ninfa Linden  . TOTAL HIP ARTHROPLASTY Right 10/06/2013   Procedure: RIGHT TOTAL HIP ARTHROPLASTY ANTERIOR APPROACH;  Surgeon: Mcarthur Rossetti, MD;  Location: [place];  Service: Orthopedics;  Laterality: Right;  . WRIST SURGERY Right    ligament repair    Current Medications: Outpatient Medications Prior to Visit  Medication Sig Dispense Refill  . albuterol (PROVENTIL HFA;VENTOLIN HFA) 108 (90 BASE) MCG/ACT inhaler Inhale 2 puffs into the lungs as needed for wheezing or shortness of breath.    Marland Kitchen amLODipine (NORVASC) 5 MG tablet Take 1 tablet (5 mg total) by mouth daily. 30 tablet 6  . ammonium lactate (LAC-HYDRIN) 12 % lotion Apply 1 application topically 3 (three) times a week.    Marland Kitchen aspirin 81 MG tablet Take 1 tablet (81 mg total) by mouth daily. 30 tablet   . benzonatate (TESSALON) 100 MG capsule Take 100 mg by mouth 2 (two) times daily as needed for cough.    Marland Kitchen CARAFATE 1 GM/10ML suspension Take 2 g by mouth daily.     . celecoxib (CELEBREX) 200 MG capsule Take 200 mg by mouth 2 (two) times daily.    . cloNIDine (CATAPRES) 0.1 MG tablet Take 0.1 mg by mouth 2 (two) times daily.     . clotrimazole (MYCELEX) 10 MG troche Take 10 mg by mouth every 4 (four) hours as needed.    . cyclobenzaprine (FLEXERIL) 10 MG tablet Take 10 mg by mouth at bedtime as needed for muscle spasms. May also take 3 x a week if needed.    . diphenhydrAMINE  (BENADRYL) 25 mg capsule Take 25 mg by mouth every 6 (six) hours as needed (cough).    . fluticasone (FLONASE) 50 MCG/ACT nasal spray Place 1 spray into both nostrils daily as needed for allergies or rhinitis.    Marland Kitchen gabapentin (NEURONTIN) 300 MG capsule Take 900 mg by mouth 3 (three) times daily.     Marland Kitchen guaifenesin (HUMIBID E) 400 MG TABS tablet Take 400 mg by mouth 2 (two) times daily as needed (cough).    Marland Kitchen guaifenesin (ROBITUSSIN) 100 MG/5ML syrup Take 300 mg by mouth 4 (four) times  daily as needed for cough. Reported on 11/15/2015    . losartan (COZAAR) 25 MG tablet Take 25 mg by mouth daily.    . methylphenidate (RITALIN) 10 MG tablet Take 10 mg by mouth 2 (two) times daily.    . metoprolol tartrate (LOPRESSOR) 25 MG tablet Take 25 mg by mouth daily.     . mupirocin ointment (BACTROBAN) 2 % Apply 1 application topically daily as needed (sores on legs).    . nystatin (MYCOSTATIN) 100000 UNIT/ML suspension Use as directed 5 mLs in the mouth or throat 3 (three) times daily. Reported on 11/15/2015    . nystatin cream (MYCOSTATIN) Apply 1 application topically 3 (three) times daily as needed for dry skin.    Marland Kitchen omeprazole (PRILOSEC) 20 MG capsule Take 20 mg by mouth daily.    Marland Kitchen oxyCODONE-acetaminophen (ROXICET) 5-325 MG per tablet Take 1-2 tablets by mouth every 4 (four) hours as needed for severe pain. 60 tablet 0  . polyethylene glycol (MIRALAX / GLYCOLAX) packet Take 17 g by mouth daily as needed for mild constipation.    . prochlorperazine (COMPAZINE) 10 MG tablet Take 10 mg by mouth every 4 (four) hours as needed for nausea or vomiting.    Marland Kitchen Prochlorperazine Maleate (COMPAZINE PO) Take 1 tablet by mouth daily as needed (nausea).    . promethazine (PHENERGAN) 25 MG suppository Place 25 mg rectally every 6 (six) hours as needed for nausea or vomiting.    . Vitamin D, Ergocalciferol, (DRISDOL) 50000 UNITS CAPS capsule Take 50,000 Units by mouth every 7 (seven) days. Wednesday    . docusate sodium  (COLACE) 100 MG capsule Take 100 mg by mouth daily as needed for mild constipation (when taking any pain medication). Reported on 11/15/2015    . FLUoxetine (PROZAC) 40 MG capsule Take 40 mg by mouth daily.     . Fluticasone-Salmeterol (ADVAIR) 250-50 MCG/DOSE AEPB Inhale 1 puff into the lungs daily. Reported on 11/15/2015    . oxyCODONE (OXY IR/ROXICODONE) 5 MG immediate release tablet Take 0.5-2 tablets (2.5-10 mg total) by mouth daily. (Patient not taking: Reported on 12/14/2016) 60 tablet 0   Facility-Administered Medications Prior to Visit  Medication Dose Route Frequency Provider Last Rate Last Dose  . lidocaine (PF) (XYLOCAINE) 1 % injection 0.3 mL  0.3 mL Other Once Magnus Sinning, MD         Allergies:   Patient has no known allergies.   Social History   Social History  . Marital status: Widowed    Spouse name: N/A  . Number of children: 0  . Years of education: N/A   Occupational History  . retired    Social History Main Topics  . Smoking status: Former Smoker    Types: Cigarettes    Quit date: 09/04/1995  . Smokeless tobacco: Never Used     Comment: quit in 1997  . Alcohol use No     Comment: recovering alcoholic, quit in 2774  . Drug use: No  . Sexual activity: Not Asked     Comment: quit 1997   Other Topics Concern  . None   Social History Narrative  . None     Family History:   Family history includes Cervical cancer in her maternal aunt; Diabetes in her father and maternal grandmother; Emphysema in her mother; Heart disease in her maternal grandmother and mother; Lung disease in her brother.   ROS General: Negative; No fevers, chills, or night sweats;  HEENT: Negative; No changes in vision  or hearing, sinus congestion, difficulty swallowing Pulmonary: Negative; No cough, wheezing, shortness of breath, hemoptysis Cardiovascular: Negative; No chest pain, presyncope, syncope, palpitations GI: Negative; No nausea, vomiting, diarrhea, or abdominal pain GU:  Negative; No dysuria, hematuria, or difficulty voiding Musculoskeletal: History of hip replacements in 2014.  In 2015 Hematologic/Oncology: Negative; no easy bruising, bleeding Endocrine: Negative; no heat/cold intolerance; no diabetes Neuro: Positive for ADD; Negative; no changes in balance, headaches Skin: Negative; No rashes or skin lesions Psychiatric: Negative; No behavioral problems, depression Sleep: Negative; No snoring, daytime sleepiness, hypersomnolence, bruxism, restless legs, hypnogognic hallucinations, no cataplexy Other comprehensive 14 point system review is negative.   PHYSICAL EXAM:   VS:  BP 110/68   Pulse 82   Ht '5\' 2"'  (1.575 m)   Wt 141 lb (64 kg)   BMI 25.79 kg/m     Repeat blood pressure by me 112/70 Wt Readings from Last 3 Encounters:  12/14/16 141 lb (64 kg)  11/14/16 145 lb 6.4 oz (66 kg)  11/29/15 133 lb (60.3 kg)    General: Alert, oriented, no distress.  Skin: normal turgor, no rashes, warm and dry HEENT: Normocephalic, atraumatic. Pupils equal round and reactive to light; sclera anicteric; extraocular muscles intact; Fundi Without hemorrhages or exudates. Nose without nasal septal hypertrophy Mouth/Parynx benign; Mallinpatti scale 2 Neck: No JVD, soft right carotid bruit; normal carotid upstroke Lungs: clear to ausculatation and percussion; no wheezing or rales Chest wall: without tenderness to palpitation Heart: PMI not displaced, RRR, s1 s2 normal, 1/6 systolic murmur, no diastolic murmur, no rubs, gallops, thrills, or heaves Abdomen: soft, nontender; no hepatosplenomehaly, BS+; abdominal aorta nontender and not dilated by palpation. Back: no CVA tenderness Pulses 2+ Musculoskeletal: full range of motion, normal strength, no joint deformities Extremities: no clubbing cyanosis or edema, Homan's sign negative  Neurologic: grossly nonfocal; Cranial nerves grossly wnl Psychologic: Normal mood and affect   Studies/Labs Reviewed:   ECG  (independently read by me): Normal sinus rhythm at 82 bpm.  Right bundle-branch block with repolarization changes.  QTc interval 467 ms.  11/14/2016 EKG:  EKG is ordered today.  ECG (independently read by me): Normal sinus rhythm at 67 bpm.  Right bundle-branch block with repolarization changes.  Wandering baseline.  Recent Labs: BMP Latest Ref Rng & Units 10/07/2013 09/30/2013 08/19/2013  Glucose 70 - 99 mg/dL 132(H) 110(H) 122(H)  BUN 6 - 23 mg/dL 19 21 25(H)  Creatinine 0.50 - 1.10 mg/dL 0.60 0.58 0.59  Sodium 137 - 147 mEq/L 140 141 130(L)  Potassium 3.7 - 5.3 mEq/L 3.9 4.1 3.6  Chloride 96 - 112 mEq/L 101 102 97  CO2 19 - 32 mEq/L '26 24 24  ' Calcium 8.4 - 10.5 mg/dL 7.8(L) 8.9 7.2(L)     Hepatic Function Latest Ref Rng & Units 02/06/2010  Total Protein 6.0 - 8.3 g/dL 6.9  Albumin 3.5 - 5.2 g/dL 4.3  AST 0 - 37 U/L 20  ALT 0 - 35 U/L 16  Alk Phosphatase 39 - 117 U/L 38(L)  Total Bilirubin 0.3 - 1.2 mg/dL 0.4    CBC Latest Ref Rng & Units 10/09/2013 10/08/2013 10/07/2013  WBC 4.0 - 10.5 K/uL 11.1(H) 15.8(H) 11.0(H)  Hemoglobin 12.0 - 15.0 g/dL 7.8(L) 8.6(L) 9.6(L)  Hematocrit 36.0 - 46.0 % 23.5(L) 26.7(L) 29.7(L)  Platelets 150 - 400 K/uL 200 230 206   Lab Results  Component Value Date   MCV 85.1 10/09/2013   MCV 85.9 10/08/2013   MCV 85.3 10/07/2013   No results found  for: TSH No results found for: HGBA1C   BNP No results found for: BNP  ProBNP No results found for: PROBNP   Lipid Panel  No results found for: CHOL, TRIG, HDL, CHOLHDL, VLDL, LDLCALC, LDLDIRECT   RADIOLOGY: No results found.   Additional studies/ records that were reviewed today include:  I have reviewed the records from [place], [place].  I reviewed previous carotid Doppler study as well as CT scan.    ASSESSMENT:    1. Essential hypertension, benign   2. Chest tightness   3. Carotid stenosis, right   4. Centrilobular emphysema ([place])   5. Hyperlipidemia  with target LDL less than 70   6. Aortic valve sclerosis   7. Hypothyroidism, unspecified type   8. Iron deficiency anemia, unspecified iron deficiency anemia type   9. Other fatigue      PLAN:  Ms. Quanisha Drewry is a 76 year old female who has a history of hypertension, peripheral neuropathy, GERD, attention deficit disorder, and has documented atherosclerosis involving her coronary arteries as well as aorta,carotid  and subclavian vessels.  She has a remote tobacco history but fortunately quit in 1997, but unfortunately has significant centrilobular emphysema and pulmonary fibrosis for which she is now on continuous supplemental oxygen therapy.  I initially saw her, was hypertensive and I added amlodipine to her medical regimen.  Her blood pressure today is improved on amlodipine 5 mg, clonidine 0.1 mg twice a day,.  Reviewed her most recent echo Doppler study which showed hyperdynamic LV function and evidence for aortic sclerosis.  Her nuclear perfusion study was low risk and showed normal perfusion without scar or ischemia.  My opinions concerning her carotid evaluation and the evaluation that she had one year ago with Dr. Kellie Simmering.  I reviewed his  records.  At time, she suffered a right retinal embolus which was felt perhaps to be due to an ulcerated right ICA stenosis approximating 50-60% with a kinked internal carotid distal to this.  She had not had any further symptoms since her retinal embolus that time, Dr. Kellie Simmering recommended she undergo a right carotid endarterectomy with resection of the redundant artery and removal of the kinked segment.  She ultimately decided against this.  She was seen Dr. Clarisa Schools at [facility] for a second opinion in 04/2016 and never showed up for follow-up appointment in November 2017.  She denies any recent neurologic or visual symptoms.  His recent laboratory with her in detail.  She is hypothyroid with a TSH of 12.  I am recommending initiation of Synthroid at  50 g.  With her PVD, I have recommended initiation of Crestor 10 mg reduce her LDL cholesterol less than 70.  She has felt improved since initiation of amlodipine and her blood pressure is stable.  She's not had any further chest pain.  I have recommended the addition of Plavix 75 mg to her baby aspirin for more  optimal antiplatelet benefit with her previous ulcerated carotid and have recommended she undergo follow-up carotid duplex imaging.  In 2 months repeat laboratory will be obtained and I will see her back in the office for follow-up evaluation.  Medication Adjustments/Labs and Tests Ordered: Current medicines are reviewed at length with the patient today.  Concerns regarding medicines are outlined above.  Medication changes, Labs and Tests ordered today are listed in the Patient Instructions below.  Patient Instructions  Medication Instructions: Dr Claiborne Billings has recommended making the following medication changes: 1. START Clopidogrel (Plavix) 75  mg daily 2. START Levothyroxine 50 mcg daily 3. START Rosuvastatin (Crestor) 10 mg daily  Labwork: Your physician recommends that you return for lab work in 2-3 months - FASTING.  Testing/Procedures: 1. Carotid Doppler - Your physician has requested that you have a carotid duplex. This test is an ultrasound of the carotid arteries in your neck. It looks at blood flow through these arteries that supply the brain with blood. Allow one hour for this exam. There are no restrictions or special instructions.  Follow-up: Dr Claiborne Billings recommends that you schedule a follow-up appointment in 2-3 months.  If you need a refill on your cardiac medications before your next appointment, please call your pharmacy.    Signed, Shelva Majestic, MD  12/17/2016 2:42 PM    [facility] [address] [place], [place], [place]  [address] Phone: [phone]

## 2016-12-14 NOTE — Patient Instructions (Signed)
Medication Instructions: Dr Claiborne Billings has recommended making the following medication changes: 1. START Clopidogrel (Plavix) 75 mg daily 2. START Levothyroxine 50 mcg daily 3. START Rosuvastatin (Crestor) 10 mg daily  Labwork: Your physician recommends that you return for lab work in 2-3 months - FASTING.  Testing/Procedures: 1. Carotid Doppler - Your physician has requested that you have a carotid duplex. This test is an ultrasound of the carotid arteries in your neck. It looks at blood flow through these arteries that supply the brain with blood. Allow one hour for this exam. There are no restrictions or special instructions.  Follow-up: Dr Claiborne Billings recommends that you schedule a follow-up appointment in 2-3 months.  If you need a refill on your cardiac medications before your next appointment, please call your pharmacy.

## 2016-12-17 ENCOUNTER — Telehealth: Payer: Self-pay | Admitting: Cardiovascular Disease

## 2016-12-17 NOTE — Telephone Encounter (Signed)
Called patient and left a voicemail with my name and number to schedule carotid and followup with Dr. Claiborne Billings.

## 2017-01-17 DIAGNOSIS — Z9981 Dependence on supplemental oxygen: Secondary | ICD-10-CM | POA: Diagnosis not present

## 2017-01-17 DIAGNOSIS — R911 Solitary pulmonary nodule: Secondary | ICD-10-CM | POA: Diagnosis not present

## 2017-01-17 DIAGNOSIS — Z8589 Personal history of malignant neoplasm of other organs and systems: Secondary | ICD-10-CM | POA: Diagnosis not present

## 2017-01-17 DIAGNOSIS — J449 Chronic obstructive pulmonary disease, unspecified: Secondary | ICD-10-CM

## 2017-01-17 DIAGNOSIS — G8929 Other chronic pain: Secondary | ICD-10-CM | POA: Diagnosis not present

## 2017-01-17 DIAGNOSIS — D649 Anemia, unspecified: Secondary | ICD-10-CM | POA: Diagnosis not present

## 2017-01-17 DIAGNOSIS — K469 Unspecified abdominal hernia without obstruction or gangrene: Secondary | ICD-10-CM | POA: Diagnosis not present

## 2017-01-17 DIAGNOSIS — M545 Low back pain: Secondary | ICD-10-CM | POA: Diagnosis not present

## 2017-03-13 ENCOUNTER — Inpatient Hospital Stay (HOSPITAL_COMMUNITY): Admission: RE | Admit: 2017-03-13 | Payer: Medicare Other | Source: Ambulatory Visit

## 2017-03-28 ENCOUNTER — Encounter (HOSPITAL_COMMUNITY): Payer: Medicare Other

## 2017-04-02 ENCOUNTER — Ambulatory Visit: Payer: Medicare Other | Admitting: Cardiovascular Disease

## 2017-04-10 ENCOUNTER — Inpatient Hospital Stay (HOSPITAL_COMMUNITY): Admission: RE | Admit: 2017-04-10 | Payer: Medicare Other | Source: Ambulatory Visit

## 2017-06-05 ENCOUNTER — Encounter (INDEPENDENT_AMBULATORY_CARE_PROVIDER_SITE_OTHER): Payer: Self-pay

## 2017-06-05 ENCOUNTER — Ambulatory Visit (HOSPITAL_COMMUNITY)
Admission: RE | Admit: 2017-06-05 | Discharge: 2017-06-05 | Disposition: A | Discharge status: Home / Self Care | Payer: Medicare Other | Source: Ambulatory Visit | Attending: Cardiovascular Disease | Admitting: Cardiovascular Disease

## 2017-06-05 DIAGNOSIS — I6523 Occlusion and stenosis of bilateral carotid arteries: Secondary | ICD-10-CM | POA: Insufficient documentation

## 2017-06-05 DIAGNOSIS — I6521 Occlusion and stenosis of right carotid artery: Secondary | ICD-10-CM

## 2017-06-18 ENCOUNTER — Encounter: Payer: Self-pay | Admitting: Cardiovascular Disease

## 2017-06-18 ENCOUNTER — Ambulatory Visit (INDEPENDENT_AMBULATORY_CARE_PROVIDER_SITE_OTHER): Payer: Medicare Other | Admitting: Cardiovascular Disease

## 2017-06-18 VITALS — BP 140/72 | HR 68 | Ht 62.0 in | Wt 135.0 lb

## 2017-06-18 DIAGNOSIS — E785 Hyperlipidemia, unspecified: Secondary | ICD-10-CM

## 2017-06-18 DIAGNOSIS — J432 Centrilobular emphysema: Secondary | ICD-10-CM | POA: Diagnosis not present

## 2017-06-18 DIAGNOSIS — I358 Other nonrheumatic aortic valve disorders: Secondary | ICD-10-CM | POA: Diagnosis not present

## 2017-06-18 DIAGNOSIS — E039 Hypothyroidism, unspecified: Secondary | ICD-10-CM

## 2017-06-18 DIAGNOSIS — I10 Essential (primary) hypertension: Secondary | ICD-10-CM | POA: Diagnosis not present

## 2017-06-18 DIAGNOSIS — I6521 Occlusion and stenosis of right carotid artery: Secondary | ICD-10-CM | POA: Diagnosis not present

## 2017-06-18 MED ORDER — ATORVASTATIN CALCIUM 20 MG PO TABS: 20.0000 mg | ORAL_TABLET | Freq: Every day | ORAL | 3 refills | Status: DC

## 2017-06-18 MED ORDER — ATORVASTATIN CALCIUM 20 MG PO TABS: 20.0000 mg | ORAL_TABLET | Freq: Every day | ORAL | 11 refills | Status: DC

## 2017-06-18 NOTE — Patient Instructions (Signed)
Medication Instructions:  INCREASE atorvastatin (Lipitor) to 20 mg daily   Labwork: Have labs at PCP-Lipid and TSH  Follow-Up: Your physician wants you to follow-up in: 6 months with Dr. Claiborne Billings.  You will receive a reminder letter in the mail two months in advance. If you don't receive a letter, please call our office to schedule the follow-up appointment.   Any Other Special Instructions Will Be Listed Below (If Applicable).     If you need a refill on your cardiac medications before your next appointment, please call your pharmacy.

## 2017-06-18 NOTE — Progress Notes (Signed)
Cardiology Office Note    Date:  06/23/2017   ID:  SABRIA FLORIDO, DOB 1941-06-25, MRN [id number]  PCP:  Kathi Ludwig, [facility] Cardiologist:  Shelva Majestic, MD   Chief Complaint  Patient presents with  . Follow-up    History of Present Illness:  HERMAN MELL is a 76 y.o. female who I initially saw for cardiology evaluation on 11/14/2016 through the courtesy of Kathi Ludwig for cardiology evaluation.  She presents now for six-month follow-up evaluation.  Ms. 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  She developed right eye visual changes.  In early 2017 a CT of her neck demonstrated complex soft and calcified plaque at the bifurcation of the right internal carotid artery and bulb with stenosis of at least 60%.  There was plaque noted in her left carotid system, not felt to be significant.  She was also found to have calcified plaque at the left subclavian artery.  There was moderate to severe centrilobular emphysema.  She had been maintained on metoprolol 25 mg daily, clonidine 0.1 mg twice a day, and losartan 25 mg daily.  .  She noticed exertionally precipitated chest tightness and was given a prescription for sl nitroglycerin and has taken this on 2 occasions with benefit.  She also admits to exertional shortness of breath.  She has a remote history of Lyme disease, history of GERD.  When Initially saw her, she was hypertensive on her medical regimen and I added amlodipine 5 mg, which would be helpful both for blood pressure control as well as anti-ischemic benefit.  I recommended she reduce her aspirin to 81 mg.  A lexiscan Myoview study was recommended and was done at [facility], which showed normal perfusion without scar or ischemia. An echo Doppler study showed an EF of 65-70% with mildly  impaired relaxation.  There was aortic sclerosis with mild aortic regurgitation.  Tricuspid regurgitation.  She underwent laboratory at [facility].  Renal function was normal.  Total cholesterol  154, LDL 82, triglycerides 159, VLDL 31.8 and HDL 42.  TSH was increased to 12.0.  Mild anemia with a hemoglobin of 11.4, hematocrit of 34.2.    When I saw her for follow-up evaluation in light of her possible ulcerated right ICA stenosis of 50 - 60% with a kinked internal carotid distal to this .  I recommended the addition of Plavix to her baby aspirin for more optimal antiplatelet therapy.  Over the past 6 months, she has felt well.  She underwent a follow-up carotid Doppler study 06/11/2017 which showed a right ICA stenosis of 60-79% in the left ICA of less than 40%.  Both vertebral arteries were patent with antegrade flow.  She has tolerated aspirin, Plavix.  She has been on Lipitor 10 mg for hyperlipidemia with target LDL less than 70.  She has been on amlodipine 5 mg for blood pressure control.  She presents for evaluation.  Past Medical History:  Diagnosis Date  . ADD (attention deficit disorder)    takes Ritalin daily  . Anemia    iron deficiency  . Back pain    stenosis  . Chronic bronchitis    was on Dexamethasone and has been off for surgery  . DDD (degenerative disc disease)   . Depression    takes Prozac daily  . DJD (degenerative joint  disease)   . Dysrhythmia    Rt BBB and Tacycardia  . Emphysema    uses Advair inhaler bid and Albuterol prn  . Emphysema   . 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 GERD (gastroesophageal reflux disease)    takes Omeprazole daily  . Headache(784.0)    migraines stopped with menopause  . History of blood transfusion   . History of colon polyps   . History of migraine   . History of staph infection 2013  . Joint pain   . Joint swelling   . Lyme disease 2013  . Muscle spasms of lower extremity    takes Flexeril daily as needed  .  Neuropathy   . OCD (obsessive compulsive disorder)   . On home oxygen therapy   . Osteoporosis   . 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 Short-term memory loss   . Shortness of breath    with exertion  . Unspecified essential hypertension    takes Ramipril,Catapres,and Metoprolol daily  . Urinary frequency   . Urinary urgency   . UTI (lower urinary tract infection) hx of   was on a Sulfa med-placed on it 08-05-13 as precaution for surgery    Past Surgical History:  Procedure Laterality Date  . ABDOMINAL HYSTERECTOMY    . CHOLECYSTECTOMY    . COLONOSCOPY    . JOINT REPLACEMENT Left 08/19/13   hip  . Porta catheter Right 2011  . right arm surgery     fracure repair  . right arm surgery    . TOTAL ABDOMINAL HYSTERECTOMY W/ BILATERAL SALPINGOOPHORECTOMY    . TOTAL HIP ARTHROPLASTY Left 08/18/2013   Procedure: LEFT TOTAL HIP ARTHROPLASTY ANTERIOR APPROACH;  Surgeon: Mcarthur Rossetti, MD;  Location: [place];  Service: Orthopedics;  Laterality: Left;  . TOTAL HIP ARTHROPLASTY Right 10/06/2013   DR Ninfa Linden  . TOTAL HIP ARTHROPLASTY Right 10/06/2013   Procedure: RIGHT TOTAL HIP ARTHROPLASTY ANTERIOR APPROACH;  Surgeon: Mcarthur Rossetti, MD;  Location: [place];  Service: Orthopedics;  Laterality: Right;  . WRIST SURGERY Right    ligament repair    Current Medications: Outpatient Medications Prior to Visit  Medication Sig Dispense Refill  . albuterol (PROVENTIL HFA;VENTOLIN HFA) 108 (90 BASE) MCG/ACT inhaler Inhale 2 puffs into the lungs as needed for wheezing or shortness of breath.    Marland Kitchen amLODipine (NORVASC) 5 MG tablet Take 1 tablet (5 mg total) by mouth daily. 30 tablet 6  . ammonium lactate (LAC-HYDRIN) 12 % lotion Apply 1 application topically 3 (three) times a week.    Marland Kitchen aspirin 81 MG tablet Take 1 tablet (81 mg total) by mouth daily. 30 tablet   . benzonatate (TESSALON) 100 MG capsule Take 100 mg by mouth 2 (two) times daily as  needed for cough.    Marland Kitchen CARAFATE 1 GM/10ML suspension Take 2 g by mouth daily.     . celecoxib (CELEBREX) 200 MG capsule Take 200 mg by mouth 2 (two) times daily.    . cloNIDine (CATAPRES) 0.1 MG tablet Take 0.1 mg by mouth 2 (two) times daily.     . clopidogrel (PLAVIX) 75 MG tablet Take 1 tablet (75 mg total) by mouth daily. 90 tablet 3  . clotrimazole (MYCELEX) 10 MG troche Take 10 mg by mouth every 4 (four) hours as needed.    . cyclobenzaprine (FLEXERIL) 10 MG tablet Take 10 mg by mouth at bedtime as needed for muscle spasms. May also  take 3 x a week if needed.    . diphenhydrAMINE (BENADRYL) 25 mg capsule Take 25 mg by mouth every 6 (six) hours as needed (cough).    . fluticasone (FLONASE) 50 MCG/ACT nasal spray Place 1 spray into both nostrils daily as needed for allergies or rhinitis.    Marland Kitchen gabapentin (NEURONTIN) 300 MG capsule Take 900 mg by mouth 3 (three) times daily.     Marland Kitchen guaifenesin (HUMIBID E) 400 MG TABS tablet Take 400 mg by mouth 2 (two) times daily as needed (cough).    Marland Kitchen levothyroxine (SYNTHROID, LEVOTHROID) 50 MCG tablet Take 1 tablet (50 mcg total) by mouth daily before breakfast. 90 tablet 3  . losartan (COZAAR) 25 MG tablet Take 25 mg by mouth daily.    . methylphenidate (RITALIN) 10 MG tablet Take 10 mg by mouth 2 (two) times daily.    . metoprolol tartrate (LOPRESSOR) 25 MG tablet Take 25 mg by mouth daily.     . mupirocin ointment (BACTROBAN) 2 % Apply 1 application topically daily as needed (sores on legs).    . nystatin (MYCOSTATIN) 100000 UNIT/ML suspension Use as directed 5 mLs in the mouth or throat 3 (three) times daily. Reported on 11/15/2015    . nystatin cream (MYCOSTATIN) Apply 1 application topically 3 (three) times daily as needed for dry skin.    Marland Kitchen omeprazole (PRILOSEC) 20 MG capsule Take 20 mg by mouth daily.    . prochlorperazine (COMPAZINE) 10 MG tablet Take 10 mg by mouth every 4 (four) hours as needed for nausea or vomiting.    Marland Kitchen Prochlorperazine Maleate  (COMPAZINE PO) Take 1 tablet by mouth daily as needed (nausea).    . promethazine (PHENERGAN) 25 MG suppository Place 25 mg rectally every 6 (six) hours as needed for nausea or vomiting.    . Vitamin D, Ergocalciferol, (DRISDOL) 50000 UNITS CAPS capsule Take 50,000 Units by mouth every 7 (seven) days. Wednesday    . guaifenesin (ROBITUSSIN) 100 MG/5ML syrup Take 300 mg by mouth 4 (four) times daily as needed for cough. Reported on 11/15/2015    . oxyCODONE-acetaminophen (ROXICET) 5-325 MG per tablet Take 1-2 tablets by mouth every 4 (four) hours as needed for severe pain. 60 tablet 0  . polyethylene glycol (MIRALAX / GLYCOLAX) packet Take 17 g by mouth daily as needed for mild constipation.    . rosuvastatin (CRESTOR) 10 MG tablet Take 1 tablet (10 mg total) by mouth daily. 90 tablet 3   Facility-Administered Medications Prior to Visit  Medication Dose Route Frequency Provider Last Rate Last Dose  . lidocaine (PF) (XYLOCAINE) 1 % injection 0.3 mL  0.3 mL Other Once Magnus Sinning, MD         Allergies:   Patient has no known allergies.   Social History   Social History  . Marital status: Widowed    Spouse name: N/A  . Number of children: 0  . Years of education: N/A   Occupational History  . retired    Social History Main Topics  . Smoking status: Former Smoker    Types: Cigarettes    Quit date: 09/04/1995  . Smokeless tobacco: Never Used     Comment: quit in 1997  . Alcohol use No     Comment: recovering alcoholic, quit in 7062  . Drug use: No  . Sexual activity: Not Asked     Comment: quit 1997   Other Topics Concern  . None   Social History Narrative  . None  Family History:   Family history includes Cervical cancer in her maternal aunt; Diabetes in her father and maternal grandmother; Emphysema in her mother; Heart disease in her maternal grandmother and mother; Lung disease in her brother.   ROS General: Negative; No fevers, chills, or night sweats;  HEENT:  Negative; No changes in vision or hearing, sinus congestion, difficulty swallowing Pulmonary: Negative; No cough, wheezing, shortness of breath, hemoptysis Cardiovascular: Negative; No chest pain, presyncope, syncope, palpitations GI: Negative; No nausea, vomiting, diarrhea, or abdominal pain GU: Negative; No dysuria, hematuria, or difficulty voiding Musculoskeletal: History of hip replacements in 2014 in 2015 Hematologic/Oncology: History of endometrial CA Endocrine: Positive for hypothyroidism on levothyroxine Neuro: Positive for ADD; Negative; no changes in balance, headaches Skin: Negative; No rashes or skin lesions Psychiatric: Negative; No behavioral problems, depression Sleep: Negative; No snoring, daytime sleepiness, hypersomnolence, bruxism, restless legs, hypnogognic hallucinations, no cataplexy Other comprehensive 14 point system review is negative.   PHYSICAL EXAM:   VS:  BP 140/72   Pulse 68   Ht '5\' 2"'  (1.575 m)   Wt 135 lb (61.2 kg)   BMI 24.69 kg/m     Repeat blood pressure by me was 122/70  Wt Readings from Last 3 Encounters:  06/18/17 135 lb (61.2 kg)  12/14/16 141 lb (64 kg)  11/14/16 145 lb 6.4 oz (66 kg)    General: Alert, oriented, no distress.  Skin: normal turgor, no rashes, warm and dry HEENT: Normocephalic, atraumatic. Pupils equal round and reactive to light; sclera anicteric; extraocular muscles intact; Nose without nasal septal hypertrophy Mouth/Parynx benign; Mallinpatti scale 2 Neck: No JVD, no carotid bruits; normal carotid upstroke Lungs: clear to ausculatation and percussion; no wheezing or rales Chest wall: without tenderness to palpitation Heart: PMI not displaced, RRR, s1 s2 normal, 1/6 systolic murmur, no diastolic murmur, no rubs, gallops, thrills, or heaves Abdomen: soft, nontender; no hepatosplenomehaly, BS+; abdominal aorta nontender and not dilated by palpation. Back: no CVA tenderness Pulses 2+ Musculoskeletal: full range of motion,  normal strength, no joint deformities Extremities: no clubbing cyanosis or edema, Homan's sign negative  Neurologic: grossly nonfocal; Cranial nerves grossly wnl Psychologic: Normal mood and affect   Studies/Labs Reviewed:   ECG (independently read by me): Normal sinus rhythm at 68 bpm.  Right bundle-branch block with repolarization changes.  Mild T wave abnormality inferiorly.  12/14/2016 ECG (independently read by me): Normal sinus rhythm at 82 bpm.  Right bundle-branch block with repolarization changes.  QTc interval 467 ms.  11/14/2016 EKG:  EKG is ordered today.  ECG (independently read by me): Normal sinus rhythm at 67 bpm.  Right bundle-branch block with repolarization changes.  Wandering baseline.  Recent Labs: BMP Latest Ref Rng & Units 10/07/2013 09/30/2013 08/19/2013  Glucose 70 - 99 mg/dL 132(H) 110(H) 122(H)  BUN 6 - 23 mg/dL 19 21 25(H)  Creatinine 0.50 - 1.10 mg/dL 0.60 0.58 0.59  Sodium 137 - 147 mEq/L 140 141 130(L)  Potassium 3.7 - 5.3 mEq/L 3.9 4.1 3.6  Chloride 96 - 112 mEq/L 101 102 97  CO2 19 - 32 mEq/L '26 24 24  ' Calcium 8.4 - 10.5 mg/dL 7.8(L) 8.9 7.2(L)     Hepatic Function Latest Ref Rng & Units 02/06/2010  Total Protein 6.0 - 8.3 g/dL 6.9  Albumin 3.5 - 5.2 g/dL 4.3  AST 0 - 37 U/L 20  ALT 0 - 35 U/L 16  Alk Phosphatase 39 - 117 U/L 38(L)  Total Bilirubin 0.3 - 1.2 mg/dL 0.4  CBC Latest Ref Rng & Units 10/09/2013 10/08/2013 10/07/2013  WBC 4.0 - 10.5 K/uL 11.1(H) 15.8(H) 11.0(H)  Hemoglobin 12.0 - 15.0 g/dL 7.8(L) 8.6(L) 9.6(L)  Hematocrit 36.0 - 46.0 % 23.5(L) 26.7(L) 29.7(L)  Platelets 150 - 400 K/uL 200 230 206   Lab Results  Component Value Date   MCV 85.1 10/09/2013   MCV 85.9 10/08/2013   MCV 85.3 10/07/2013   No results found for: TSH No results found for: HGBA1C   BNP No results found for: BNP  ProBNP No results found for: PROBNP   Lipid Panel  No results found for: CHOL, TRIG, HDL, CHOLHDL, VLDL, LDLCALC,  LDLDIRECT   RADIOLOGY: No results found.   Additional studies/ records that were reviewed today include:  I have reviewed the records from [place], [place].  I reviewed previous carotid Doppler study as well as CT scan.    ASSESSMENT:    1. Carotid stenosis, right   2. Essential hypertension, benign   3. Hypothyroidism, unspecified type   4. Centrilobular emphysema ([place])   5. Hyperlipidemia with target LDL less than 70   6. Aortic valve sclerosis      PLAN:  Ms. Dayna Alia is a 76 year old female who has a history of hypertension, peripheral neuropathy, GERD, attention deficit disorder, and has documented atherosclerosis involving her coronary arteries as well as aorta,carotid  and subclavian vessels.  She has a remote tobacco history but fortunately quit in 1997, but unfortunately has significant centrilobular emphysema and pulmonary fibrosis for which she is now on continuous supplemental oxygen therapy.  On echo, she has  hyperdynamic LV function and evidence for aortic sclerosis.  Her nuclear perfusion study was low risk and showed normal perfusion without scar or ischemia.  I reviewed the records of Dr. Kellie Simmering, which she had seen one year ago.  At time, she suffered a right retinal embolus which was felt perhaps to be due to an ulcerated right ICA stenosis approximating 50-60% with a kinked internal carotid distal to this.  She had not had any further symptoms since her retinal embolus that time, Dr. Kellie Simmering recommended she undergo a right carotid endarterectomy with resection of the redundant artery and removal of the kinked segment.  She ultimately decided against this.  She was seen Dr. Clarisa Schools at [facility] for a second opinion in 04/2016 and never showed up for follow-up appointment in November 2017.  She denies any recent neurologic or visual symptoms.  I reviewed her most carotid study as detailed above, which shows 60-79% right ICA  stenoses with velocity slightly increased her prior study and less than 40% in the left carotid.  Her blood pressure today is stable on amlodipine 5 mg daily, losartan 25 mg,  metoprolol 25 mg.  in addition to clonidine 0.1 mg twice a day.  She continues to be on dual antiplatelet therapy and has not had neurologic symptoms or bleeding.  I started her on levothyroxine for hypothyroidism.  She continues to take 50 g daily.  I have recommended titration of atorvastatin to 20 mg with target LDL less than 70.  Repeat lipids and TSH will be rechecked in one month.  She will be seen [facility] for follow-up evaluation.  I will see her in 6 months for reevaluation.   Medication Adjustments/Labs and Tests Ordered: Current medicines are reviewed at length with the patient today.  Concerns regarding medicines are outlined above.  Medication changes, Labs and Tests ordered today are listed in  the Patient Instructions below.  Patient Instructions  Medication Instructions:  INCREASE atorvastatin (Lipitor) to 20 mg daily   Labwork: Have labs at PCP-Lipid and TSH  Follow-Up: Your physician wants you to follow-up in: 6 months with Dr. Claiborne Billings.  You will receive a reminder letter in the mail two months in advance. If you don't receive a letter, please call our office to schedule the follow-up appointment.   Any Other Special Instructions Will Be Listed Below (If Applicable).     If you need a refill on your cardiac medications before your next appointment, please call your pharmacy.      Signed, Shelva Majestic, MD  06/23/2017 1:02 PM    [facility] [address] [place], [place], [place]  [address] Phone: [phone]

## 2017-11-27 ENCOUNTER — Telehealth (INDEPENDENT_AMBULATORY_CARE_PROVIDER_SITE_OTHER): Payer: Self-pay | Admitting: Physical Medicine and Rehabilitation

## 2017-11-27 NOTE — Telephone Encounter (Signed)
Scheduled for an OV 12/10/17 at 1045.

## 2017-12-10 ENCOUNTER — Ambulatory Visit (INDEPENDENT_AMBULATORY_CARE_PROVIDER_SITE_OTHER): Payer: Medicare Other | Admitting: Physical Medicine and Rehabilitation

## 2017-12-10 ENCOUNTER — Encounter (INDEPENDENT_AMBULATORY_CARE_PROVIDER_SITE_OTHER): Payer: Self-pay | Admitting: Physical Medicine and Rehabilitation

## 2017-12-10 VITALS — BP 127/76 | HR 85 | Temp 98.6°F

## 2017-12-10 DIAGNOSIS — M419 Scoliosis, unspecified: Secondary | ICD-10-CM | POA: Diagnosis not present

## 2017-12-10 DIAGNOSIS — M47816 Spondylosis without myelopathy or radiculopathy, lumbar region: Secondary | ICD-10-CM | POA: Diagnosis not present

## 2017-12-10 DIAGNOSIS — G8929 Other chronic pain: Secondary | ICD-10-CM

## 2017-12-10 DIAGNOSIS — M545 Low back pain: Secondary | ICD-10-CM | POA: Diagnosis not present

## 2017-12-10 DIAGNOSIS — G894 Chronic pain syndrome: Secondary | ICD-10-CM | POA: Diagnosis not present

## 2017-12-10 NOTE — Progress Notes (Signed)
Numeric Pain Rating Scale and Functional Assessment Average Pain 6 Pain Right Now 4 My pain is constant, burning and aching Pain is worse with: walking, bending and standing Pain improves with: rest, heat/ice and medication   In the last MONTH (on 0-10 scale) has pain interfered with the following?  1. General activity like being  able to carry out your everyday physical activities such as walking, climbing stairs, carrying groceries, or moving a chair?  Rating(7)  2. Relation with others like being able to carry out your usual social activities and roles such as  activities at home, at work and in your community. Rating(4)  3. Enjoyment of life such that you have  been bothered by emotional problems such as feeling anxious, depressed or irritable?  Rating(4)

## 2017-12-16 ENCOUNTER — Encounter (INDEPENDENT_AMBULATORY_CARE_PROVIDER_SITE_OTHER): Payer: Medicare Other | Admitting: Physical Medicine and Rehabilitation

## 2017-12-17 ENCOUNTER — Encounter (INDEPENDENT_AMBULATORY_CARE_PROVIDER_SITE_OTHER): Payer: Self-pay | Admitting: Physical Medicine and Rehabilitation

## 2017-12-17 NOTE — Progress Notes (Signed)
Stacey Bowman - 77 y.o. female MRN [id number]  Date of birth: 08/31/1941  Office Visit Note: Visit Date: 12/10/2017 PCP: System, Provider Not In Referred by: No ref. provider found  Subjective: Chief Complaint  Patient presents with  . Lower Back - Pain   HPI: Stacey Bowman is a 77 year old female that I last saw in 2017 and had completed facet joint blocks with good benefit of her low back pain at the time combined with some physical therapy and medication management and she has been doing okay up until the last several months for she has had some progressive worsening low back pain but really acute onset of worsening symptoms after falling in the bathroom on November 16, 2017.  She was seen in urgent care and given a Toradol shot and she has been taking some gabapentin by Dr. land.  She reports some decrease in symptoms since that time but continues to have axial low back pain worse with standing and walking.  X-rays completed did not show any fractures or dislocations.  She is not endorsing any focal weakness.  She has no radicular pain.  MRI finding from 2017 showed some progressive scoliosis and lateral recess narrowing particularly at L4-5.  In the past she has had some radicular pain and was helped by epidural injection but does not have that in some time.  She reports that she cannot stand and walk very long without symptoms.  She denies any red flag symptoms of again focal weakness or fevers chills or night sweats or unexplained weight loss.  Her case is complicated complicated by bilateral total hip arthroplasties.  These were performed by Dr. Ninfa Linden in our office who she sees for orthopedic care.  Her case is also complicated by some depression and anxiety as well as COPD and she is on Plavix anticoagulation.   Review of Systems  Constitutional: Negative for chills, fever, malaise/fatigue and weight loss.  HENT: Negative for hearing loss and sinus pain.   Eyes: Negative for blurred  vision, double vision and photophobia.  Respiratory: Negative for cough and shortness of breath.   Cardiovascular: Negative for chest pain, palpitations and leg swelling.  Gastrointestinal: Negative for abdominal pain, nausea and vomiting.  Genitourinary: Negative for flank pain.  Musculoskeletal: Positive for back pain, falls and joint pain. Negative for myalgias.  Skin: Negative for itching and rash.  Neurological: Negative for tingling, tremors, focal weakness and weakness.  Endo/Heme/Allergies: Negative.   Psychiatric/Behavioral: Negative for depression.  All other systems reviewed and are negative.  Otherwise per HPI.  Assessment & Plan: Visit Diagnoses:  1. Spondylosis without myelopathy or radiculopathy, lumbar region   2. Scoliosis of thoracolumbar spine, unspecified scoliosis type   3. Chronic bilateral low back pain without sciatica   4. Chronic pain syndrome     Plan: Findings:  Chronic history of low back pain with history of facet arthropathy and scoliosis and some lateral recess stenosis but no central canal stenosis or focal nerve compression.  Last MRI from 2017.  Recent fall with lumbar sprain strain added to chronic issues.  She has gotten some better since the fall she is becoming more mobile but having difficulty standing.  I think she is having mostly pain from the facet joints is likely saw her in 2017.  I think the best approach is diagnostic medial branch blocks of the L4-5 and L5-S1 facet joints.  We can do that with her on the blood thinner.  We discussed this at length.  Goal would be to look at potentially radiofrequency ablation.  If she just was not getting much relief we would consider updating MRI if needed.  She has not had any issues with the hips but she can follow-up with Dr. Ninfa Linden with concerns.  We will not change current medications.    Meds & Orders: No orders of the defined types were placed in this encounter.  No orders of the defined types were  placed in this encounter.   Follow-up: Return for Bilateral L4-5 and L5-S1 facet.   Procedures: No procedures performed  No notes on file   Clinical History: MRI LUMBAR SPINE WITHOUT CONTRAST  TECHNIQUE: Multiplanar, multisequence MR imaging of the lumbar spine was performed. No intravenous contrast was administered.  COMPARISON: MRI of the lumbar spine 09/27/2009.  FINDINGS: Normal signal is present in the conus medullaris which terminates at L1-2. There is progressive scoliosis, convex to the right at L3-4. Progressive chronic endplate marrow changes are noted on the right at L1-2 and L2-3 and on the left at L3-4. Vertebral body heights are maintained. There slight retrolisthesis at L2-3 and L3-4 without significant change.  Limited imaging of the abdomen is unremarkable. There is no significant adenopathy.  L1-2: Mild disc bulging and facet hypertrophy are present without significant stenosis.  L2-3: A leftward disc protrusion is progressed. Asymmetric left-sided facet hypertrophy is progressed as well. Moderate left subarticular and foraminal stenosis has progressed.  L3-4: A broad-based disc protrusion is asymmetric to the left. Moderate left subarticular and foraminal stenosis is similar to the prior study. Mild right foraminal narrowing is stable.  L4-5: A broad-based disc protrusion is present. Moderate facet hypertrophy has progressed, right greater than left. Moderate to severe right and moderate left subarticular stenosis is present. There is some progression of moderate right and mild left foraminal stenosis.  L5-S1: A broad-based disc protrusion is present. Facet hypertrophy and mild subarticular narrowing bilaterally has progressed. There is some progression of mild foraminal narrowing as well, right greater than left.  IMPRESSION: 1. Progressive scoliosis and spondylosis of the lumbar spine as described. 2. Leftward disc protrusion and facet  hypertrophy at L2-3 resulting in moderate left subarticular and foraminal stenosis. 3. Similar appearance of moderate left subarticular and foraminal stenosis at L3-4. Mild right foraminal narrowing is stable. 4. Progression of moderate to severe right and moderate left subarticular stenosis at L4-5. Progression of moderate right and mild left foraminal stenosis. 5. Slow progression of mild subarticular narrowing bilaterally L5-S1. 6. Mild foraminal narrowing at L5-S1 is also progressed.   Electronically Signed By: San Morelle M.D. On: 09/07/2015 20:51   She reports that she quit smoking about 22 years ago. Her smoking use included cigarettes. She has never used smokeless tobacco. No results for input(s): HGBA1C, LABURIC in the last 8760 hours.  Objective:  VS:  HT:    WT:   BMI:     BP:127/76  HR:85bpm  TEMP:98.6 F (37 C)( )  RESP:100 % Physical Exam  Constitutional: She is oriented to person, place, and time. She appears well-developed and well-nourished. No distress.  HENT:  Head: Normocephalic and atraumatic.  Nose: Nose normal.  Mouth/Throat: Oropharynx is clear and moist.  Eyes: Pupils are equal, round, and reactive to light. Conjunctivae are normal.  Neck: Normal range of motion. Neck supple.  Cardiovascular: Regular rhythm and intact distal pulses.  Pulmonary/Chest: Effort normal. No respiratory distress.  Abdominal: She exhibits no distension. There is no guarding.  Musculoskeletal:  Patient is slow to rise  from a seated position and does have concordant low back pain with extension rotation facet joint loading.  This is bilateral.  No pain over the greater trochanters and no pain with hip rotation.  She has good distal strength without clonus.  Neurological: She is alert and oriented to person, place, and time. She exhibits normal muscle tone. Coordination normal.  Skin: Skin is warm. No rash noted. No erythema.  Psychiatric: She has a normal mood and  affect. Her behavior is normal.  Nursing note and vitals reviewed.   Ortho Exam Imaging: No results found.  Past Medical/Family/Surgical/Social History: Medications & Allergies reviewed per EMR, new medications updated. Patient Active Problem List   Diagnosis Date Noted  . Carotid artery stenosis, symptomatic 11/15/2015  . Arthritis of right hip 10/06/2013  . Degenerative arthritis of left hip 08/18/2013  . 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  . Iron deficiency anemia 07/15/2013  . Endometrial ca [facility]) 07/15/2013   Past Medical History:  Diagnosis Date  . ADD (attention deficit disorder)    takes Ritalin daily  . Anemia    iron deficiency  . Back pain    stenosis  . Chronic bronchitis    was on Dexamethasone and has been off for surgery  . DDD (degenerative disc disease)   . Depression    takes Prozac daily  . DJD (degenerative joint disease)   . Dysrhythmia    Rt BBB and Tacycardia  . Emphysema    uses Advair inhaler bid and Albuterol prn  . Emphysema   . 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 GERD (gastroesophageal reflux disease)    takes Omeprazole daily  . Headache(784.0)    migraines stopped with menopause  . History of blood transfusion   . History of colon polyps   . History of migraine   . History of staph infection 2013  . Joint pain   . Joint swelling   . Lyme disease 2013  . Muscle spasms of lower extremity    takes Flexeril daily as needed  . Neuropathy   . OCD (obsessive compulsive disorder)   . On home oxygen therapy   . Osteoporosis   . 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 Short-term memory loss   . Shortness of breath    with exertion  . Unspecified essential hypertension    takes Ramipril,Catapres,and Metoprolol daily  . Urinary frequency   . Urinary urgency   . UTI (lower urinary tract infection) hx of   was on a Sulfa med-placed on it 08-05-13 as precaution for surgery   Family  History  Problem Relation Age of Onset  . Cervical cancer Maternal Aunt   . Emphysema Mother   . Heart disease Mother   . Diabetes Father   . Diabetes Maternal Grandmother   . Heart disease Maternal Grandmother   . Lung disease Brother    Past Surgical History:  Procedure Laterality Date  . ABDOMINAL HYSTERECTOMY    . CHOLECYSTECTOMY    . COLONOSCOPY    . JOINT REPLACEMENT Left 08/19/13   hip  . Porta catheter Right 2011  . right arm surgery     fracure repair  . right arm surgery    . TOTAL ABDOMINAL HYSTERECTOMY W/ BILATERAL SALPINGOOPHORECTOMY    . TOTAL HIP ARTHROPLASTY Left 08/18/2013   Procedure: LEFT TOTAL HIP ARTHROPLASTY ANTERIOR APPROACH;  Surgeon: Mcarthur Rossetti, MD;  Location: [facility];  Service: Orthopedics;  Laterality: Left;  . TOTAL HIP ARTHROPLASTY Right 10/06/2013   DR Ninfa Linden  . TOTAL HIP ARTHROPLASTY Right 10/06/2013   Procedure: RIGHT TOTAL HIP ARTHROPLASTY ANTERIOR APPROACH;  Surgeon: Mcarthur Rossetti, MD;  Location: [facility];  Service: Orthopedics;  Laterality: Right;  . WRIST SURGERY Right    ligament repair   Social History   Occupational History  . Occupation: retired  Tobacco Use  . Smoking status: Former Smoker    Types: Cigarettes    Last attempt to quit: 09/04/1995    Years since quitting: 22.3  . Smokeless tobacco: Never Used  . Tobacco comment: quit in 1997  Substance and Sexual Activity  . Alcohol use: No    Comment: recovering alcoholic, quit in [id number]  . Drug use: No  . Sexual activity: Not on file    Comment: quit 1997

## 2017-12-19 ENCOUNTER — Encounter (INDEPENDENT_AMBULATORY_CARE_PROVIDER_SITE_OTHER): Payer: Medicare Other | Admitting: Physical Medicine and Rehabilitation

## 2018-01-14 ENCOUNTER — Encounter (INDEPENDENT_AMBULATORY_CARE_PROVIDER_SITE_OTHER): Payer: Medicare Other | Admitting: Physical Medicine and Rehabilitation

## 2018-04-07 DIAGNOSIS — Z8542 Personal history of malignant neoplasm of other parts of uterus: Secondary | ICD-10-CM | POA: Diagnosis not present

## 2018-04-07 DIAGNOSIS — Z9981 Dependence on supplemental oxygen: Secondary | ICD-10-CM

## 2018-04-07 DIAGNOSIS — Z96642 Presence of left artificial hip joint: Secondary | ICD-10-CM

## 2018-04-07 DIAGNOSIS — Z9221 Personal history of antineoplastic chemotherapy: Secondary | ICD-10-CM

## 2018-04-07 DIAGNOSIS — R918 Other nonspecific abnormal finding of lung field: Secondary | ICD-10-CM

## 2018-04-07 DIAGNOSIS — D6489 Other specified anemias: Secondary | ICD-10-CM

## 2018-04-07 DIAGNOSIS — R971 Elevated cancer antigen 125 [CA 125]: Secondary | ICD-10-CM

## 2018-04-07 DIAGNOSIS — Z96641 Presence of right artificial hip joint: Secondary | ICD-10-CM

## 2018-04-07 DIAGNOSIS — E611 Iron deficiency: Secondary | ICD-10-CM

## 2018-04-07 DIAGNOSIS — J8410 Pulmonary fibrosis, unspecified: Secondary | ICD-10-CM

## 2018-04-14 DIAGNOSIS — M2550 Pain in unspecified joint: Secondary | ICD-10-CM

## 2018-04-14 HISTORY — DX: Pain in unspecified joint: M25.50

## 2018-04-20 ENCOUNTER — Other Ambulatory Visit: Payer: Self-pay | Admitting: Cardiovascular Disease

## 2018-06-10 ENCOUNTER — Telehealth (INDEPENDENT_AMBULATORY_CARE_PROVIDER_SITE_OTHER): Payer: Self-pay | Admitting: Physical Medicine and Rehabilitation

## 2018-06-10 NOTE — Telephone Encounter (Signed)
ov

## 2018-06-10 NOTE — Telephone Encounter (Signed)
Can you call to schedule an OV?

## 2018-06-11 NOTE — Telephone Encounter (Signed)
Pt is scheduled for ov 06/27/18

## 2018-06-24 ENCOUNTER — Ambulatory Visit: Payer: Medicare Other | Admitting: Cardiovascular Disease

## 2018-06-27 ENCOUNTER — Ambulatory Visit (INDEPENDENT_AMBULATORY_CARE_PROVIDER_SITE_OTHER): Payer: Self-pay | Admitting: Physical Medicine and Rehabilitation

## 2018-10-02 ENCOUNTER — Encounter (INDEPENDENT_AMBULATORY_CARE_PROVIDER_SITE_OTHER): Payer: Self-pay

## 2018-10-02 ENCOUNTER — Ambulatory Visit: Payer: Medicare Other | Admitting: Cardiovascular Disease

## 2018-11-20 ENCOUNTER — Ambulatory Visit: Payer: Medicare Other | Admitting: Cardiovascular Disease

## 2019-02-06 ENCOUNTER — Telehealth: Payer: Medicare Other | Admitting: Cardiovascular Disease

## 2019-02-20 ENCOUNTER — Telehealth: Payer: Medicare Other | Admitting: Physician Assistant

## 2019-03-10 ENCOUNTER — Telehealth: Payer: Self-pay | Admitting: Physician Assistant

## 2019-03-10 NOTE — Telephone Encounter (Signed)
Attempted to reach pt to review consent information and info needed for virtual visit. Unable to LM on home phone (VM full). LM on mobile phone to call back about virtual appt on 03/12/19 with Fabian Sharp, [place]. Informed that I will attempt to call again this afternoon.

## 2019-03-11 NOTE — Progress Notes (Signed)
Virtual Visit via Telephone Note   This visit type was conducted due to national recommendations for restrictions regarding the COVID-19 Pandemic (e.g. social distancing) in an effort to limit this patient's exposure and mitigate transmission in our community.  Due to her co-morbid illnesses, this patient is at least at moderate risk for complications without adequate follow up.  This format is felt to be most appropriate for this patient at this time.  The patient did not have access to video technology/had technical difficulties with video requiring transitioning to audio format only (telephone).  All issues noted in this document were discussed and addressed.  No physical exam could be performed with this format.  Please refer to the patient's chart for her  consent to telehealth for [facility].   Date:  03/12/2019   ID:  Stacey Bowman, DOB 1941-03-01, MRN [id number]  Patient Location: Home Provider Location: Office  PCP:  System, Provider Not In  Cardiologist:  Shelva Majestic, MD  Electrophysiologist:  None   Evaluation Performed:  Follow-Up Visit  Chief Complaint:  HTN  History of Present Illness:    Stacey Bowman is a 78 y.o. 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 At the time of her retinal embolus, Dr. Kellie Simmering recommended right endarterectomy with resection of a redundant artery and removal of kinked segment. She refused this intervention and wanted a second opinion (never followed up). She established cardiology care with Dr. Claiborne Billings and was last seen 06/18/17. At that time, she had some exertional chest tightness relieved with nitro. DOE also reported. Myoview at [facility] was negative for scar or reversible ischemia. Echocardiogram showed EF of 65-70% with mild DD, aortic  sclerosis with mild AR, TR. LDL 82, Tri 159, HDL 42, total chol 154 (2018). Follow up carotid dopplers 2018 showed right ICA 60-79% stenosis and left ICA of less than 40%. Norvasc for HTN and angina, lipitor 10 mg for hyperlipidemia. Lipitor increased to 20 mg.   She has canceled her last 6 cardiology appts.   She presents today for a 2 year follow up. She denies syncope. She reports a few instances of possible TIA in which she loses vision in her eye, but vision always returns. She thinks this has happened 4-5 times in the past couple of months. She also states that she fell and hit her head a few days ago. She went to the ER and was diagnosed with a concussion. She asks if it is normal to have problems with her senses, for example, not being able to use the remote. She can't figure out how to change channels with the remote. I'm very concerned about these periods of blindness and difficulty with daily activities.   BP at home 140/100.   D/C clonidine. She was taking this PRN and was taking this at nighttime. She didn't want to take it in the day because it would make her dizzy and fatigued. She felt like she had taken a tranquilizer. Will increase amlodipine to 10 mg.    The patient does not have symptoms concerning for COVID-19 infection (fever, chills, cough, or new shortness of breath).    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   GERD (gastroesophageal reflux disease)    takes Omeprazole daily   Headache(784.0)    migraines stopped  with menopause   History of blood transfusion    History of colon polyps    History of migraine    History of staph infection 2013   Joint pain    Joint swelling    Lyme disease 2013   Muscle spasms of lower extremity    takes Flexeril daily as needed   Neuropathy    OCD (obsessive compulsive disorder)    On home oxygen therapy    Osteoporosis    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[place])    was taking steroids-came off in May 2014   Short-term memory loss    Shortness of breath    with exertion   Unspecified essential hypertension    takes Ramipril,Catapres,and Metoprolol daily   Urinary frequency    Urinary urgency    UTI (lower urinary tract infection) hx of   was on a Sulfa med-placed on it 08-05-13 as precaution for surgery   Past Surgical History:  Procedure Laterality Date   ABDOMINAL HYSTERECTOMY     CHOLECYSTECTOMY     COLONOSCOPY     JOINT REPLACEMENT Left 08/19/13   hip   Porta catheter Right 2011   right arm surgery     fracure repair   right arm surgery     TOTAL ABDOMINAL HYSTERECTOMY W/ BILATERAL SALPINGOOPHORECTOMY     TOTAL HIP ARTHROPLASTY Left 08/18/2013   Procedure: LEFT TOTAL HIP ARTHROPLASTY ANTERIOR APPROACH;  Surgeon: Mcarthur Rossetti, MD;  Location: [place];  Service: Orthopedics;  Laterality: Left;   TOTAL HIP ARTHROPLASTY Right 10/06/2013   DR Ninfa Linden   TOTAL HIP ARTHROPLASTY Right 10/06/2013   Procedure: RIGHT TOTAL HIP ARTHROPLASTY ANTERIOR APPROACH;  Surgeon: Mcarthur Rossetti, MD;  Location: [place];  Service: Orthopedics;  Laterality: Right;   WRIST SURGERY Right    ligament repair     Current Meds  Medication Sig   albuterol (PROVENTIL HFA;VENTOLIN HFA) 108 (90 BASE) MCG/ACT inhaler Inhale 2 puffs into the lungs as needed for wheezing or shortness of breath.   amLODipine (NORVASC) 10 MG tablet Take 1 tablet (10 mg total) by mouth daily.   ammonium lactate (LAC-HYDRIN) 12 % lotion Apply 1 application topically  3 (three) times a week.   apixaban (ELIQUIS) 5 MG TABS tablet Take 5 mg by mouth 2 (two) times daily.   aspirin 81 MG tablet Take 1 tablet (81 mg total) by mouth daily.   atorvastatin (LIPITOR) 20 MG tablet Take 20 mg by mouth daily.   benzonatate (TESSALON) 100 MG capsule Take 100 mg by mouth 2 (two) times daily as needed for cough.   CARAFATE 1 GM/10ML suspension Take 2 g by mouth daily.    celecoxib (CELEBREX) 200 MG capsule Take 200 mg by mouth 2 (two) times daily.   clopidogrel (PLAVIX) 75 MG tablet TAKE 1 TABLET BY MOUTH ONCE DAILY   clotrimazole (MYCELEX) 10 MG troche Take 10 mg by mouth every 4 (four) hours as needed.  cyclobenzaprine (FLEXERIL) 10 MG tablet Take 10 mg by mouth at bedtime as needed for muscle spasms. May also take 3 x a week if needed.   diphenhydrAMINE (BENADRYL) 25 mg capsule Take 25 mg by mouth every 6 (six) hours as needed (cough).   DULoxetine (CYMBALTA) 30 MG capsule Take 90 mg by mouth daily.   fluticasone (FLONASE) 50 MCG/ACT nasal spray Place 1 spray into both nostrils daily as needed for allergies or rhinitis.   folic acid (FOLVITE) 1 MG tablet Take 1 mg by mouth daily.   gabapentin (NEURONTIN) 300 MG capsule TAKE 900 MG AT BEDTIME AND 300 MG 1-2 TIMES IN THE DAYTIME.   guaifenesin (HUMIBID E) 400 MG TABS tablet Take 400 mg by mouth 2 (two) times daily as needed (cough).   guaiFENesin-codeine (ROBITUSSIN AC) 100-10 MG/5ML syrup Take 5 mLs by mouth 3 (three) times daily as needed for cough.   levothyroxine (SYNTHROID) 137 MCG tablet Take 137 mcg by mouth daily before breakfast.   losartan (COZAAR) 25 MG tablet Take 25 mg by mouth daily.   methylphenidate (RITALIN) 10 MG tablet Take 10 mg by mouth 2 (two) times daily.   metoprolol succinate (TOPROL-XL) 50 MG 24 hr tablet Take 50 mg by mouth daily. Take with or immediately following a meal.   montelukast (SINGULAIR) 10 MG tablet Take 10 mg by mouth at bedtime.    mupirocin ointment  (BACTROBAN) 2 % Apply 1 application topically daily as needed (sores on legs).   nitroGLYCERIN (NITROSTAT) 0.4 MG SL tablet Place 0.4 mg under the tongue every 5 (five) minutes as needed for chest pain.   nystatin cream (MYCOSTATIN) Apply 1 application topically 3 (three) times daily as needed for dry skin.   omeprazole (PRILOSEC) 20 MG capsule Take 20 mg by mouth daily.   prochlorperazine (COMPAZINE) 10 MG tablet Take 10 mg by mouth every 4 (four) hours as needed for nausea or vomiting.   promethazine (PHENERGAN) 25 MG suppository Place 25 mg rectally every 6 (six) hours as needed for nausea or vomiting.   Vitamin D, Ergocalciferol, (DRISDOL) 50000 UNITS CAPS capsule Take 50,000 Units by mouth every 7 (seven) days. Wednesday   [DISCONTINUED] amLODipine (NORVASC) 5 MG tablet Take 1 tablet (5 mg total) by mouth daily.   [DISCONTINUED] amLODipine (NORVASC) 5 MG tablet Take 5 mg by mouth daily.    [DISCONTINUED] cloNIDine (CATAPRES) 0.1 MG tablet Take 0.1 mg by mouth as needed.    [DISCONTINUED] levothyroxine (SYNTHROID, LEVOTHROID) 50 MCG tablet Take 1 tablet (50 mcg total) by mouth daily before breakfast.   [DISCONTINUED] metoprolol tartrate (LOPRESSOR) 25 MG tablet Take 25 mg by mouth daily.    [DISCONTINUED] nystatin (MYCOSTATIN) 100000 UNIT/ML suspension Use as directed 5 mLs in the mouth or throat 3 (three) times daily. Reported on 11/15/2015   [DISCONTINUED] Prochlorperazine Maleate (COMPAZINE PO) Take 1 tablet by mouth daily as needed (nausea).   Current Facility-Administered Medications for the 03/12/19 encounter (Telemedicine) with Ledora Bottcher, PA  Medication   lidocaine (PF) (XYLOCAINE) 1 % injection 0.3 mL     Allergies:   Patient has no known allergies.   Social History   Tobacco Use   Smoking status: Former Smoker    Types: Cigarettes    Quit date: 09/04/1995    Years since quitting: 23.5   Smokeless tobacco: Never Used   Tobacco comment: quit in 1997    Substance Use Topics   Alcohol use: No    Comment: recovering alcoholic, quit in  1992   Drug use: No     Family Hx: The patient's family history includes Cervical cancer in her maternal aunt; Diabetes in her father and maternal grandmother; Emphysema in her mother; Heart disease in her maternal grandmother and mother; Lung disease in her brother.  ROS:   Please see the history of present illness.     All other systems reviewed and are negative.   Prior CV studies:   The following studies were reviewed today:  Carotid ultrasound 06/05/2017: Final Interpretation: Right Carotid: There is evidence in the right ICA of a 60-79% stenosis.                ICA velocities have increased from prior study.  Left Carotid: There is evidence in the left ICA of a less than 40% stenosis. The               extracranial vessels were near-normal with only minimal wall               thickening or plaque.  Vertebrals:  Both vertebral arteries were patent with antegrade flow. Subclavians: Normal flow hemodynamics were seen in bilateral subclavian              arteries.  Labs/Other Tests and Data Reviewed:    EKG:  An ECG dated 06/18/17 was personally reviewed today and demonstrated:  sinus rhythm, HR 68, RBBB, T wave abnormality in inferior leads  Recent Labs: No results found for requested labs within last 8760 hours.   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 We had a long discussion about the need for neurology and vascular surgery evaluations. Referrals sent for both. In the meantime, I have ordered carotid ultrasound to evaluate disease progression.   Hypertension Uncontrolled. She was taking clonidie sporadically PRN at bedtime, because it was sedating to her and made her dizzy. I D/C'ed clonidine and increased norasc to 10 mg daily.    COVID-19 Education: The signs and symptoms of COVID-19 were discussed with the patient and how to seek care for testing (follow up with PCP or arrange E-visit).  The importance of social distancing was discussed today.  Time:   Today, I have spent 28 minutes with the patient with telehealth technology discussing the above problems.     Medication Adjustments/Labs and Tests Ordered: Current medicines are reviewed at length with the patient today.  Concerns regarding medicines are outlined above.   Tests Ordered: Orders Placed This Encounter  Procedures   Ambulatory referral to Vascular Surgery   Ambulatory referral to Neurology    Medication Changes: Meds ordered this encounter  Medications   amLODipine (NORVASC) 10 MG tablet    Sig: Take 1 tablet (10 mg total) by mouth daily.    Dispense:  90 tablet  Refill:  0    Follow Up:  Virtual Visit or In Person in 3 month(s)  Signed, Ledora Bottcher, PA  03/12/2019 4:59 PM    [facility]

## 2019-03-12 ENCOUNTER — Telehealth: Payer: Self-pay | Admitting: *Deleted

## 2019-03-12 ENCOUNTER — Telehealth (INDEPENDENT_AMBULATORY_CARE_PROVIDER_SITE_OTHER): Payer: Medicare Other | Admitting: Physician Assistant

## 2019-03-12 ENCOUNTER — Telehealth: Payer: Self-pay

## 2019-03-12 VITALS — Ht 64.0 in | Wt 135.0 lb

## 2019-03-12 DIAGNOSIS — R2990 Unspecified symptoms and signs involving the nervous system: Secondary | ICD-10-CM

## 2019-03-12 DIAGNOSIS — I6529 Occlusion and stenosis of unspecified carotid artery: Secondary | ICD-10-CM | POA: Diagnosis not present

## 2019-03-12 MED ORDER — AMLODIPINE BESYLATE 10 MG PO TABS: 10.0000 mg | ORAL_TABLET | Freq: Every day | ORAL | 0 refills | Status: DC

## 2019-03-12 NOTE — Patient Instructions (Addendum)
Medication Instructions:  Your physician has recommended you make the following change in your medication:    INCREASE YOUR AMLODIPINE TO 10 MG BY MOUTH DAILY.  STOP TAKING YOUR CLONIDINE.  If you need a refill on your cardiac medications before your next appointment, please call your pharmacy.   Lab work: NONE If you have labs (blood work) drawn today and your tests are completely normal, you will receive your results only by: Marland Kitchen MyChart Message (if you have MyChart) OR . A paper copy in the mail If you have any lab test that is abnormal or we need to change your treatment, we will call you to review the results.  Testing/Procedures: Your physician has requested that you have a carotid duplex. This test is an ultrasound of the carotid arteries in your neck. It looks at blood flow through these arteries that supply the brain with blood. Allow one hour for this exam. There are no restrictions or special instructions.  THIS HAS BEEN SET UP TO BE PERFORMED AT [place] AT [facility] ON March 30, 2019 AT 1:00 PM. PLEASE ARRIVE AT 12:30 PM.     Follow-Up: At [facility], you and your health needs are our priority.  As part of our continuing mission to provide you with exceptional heart care, we have created designated Provider Care Teams.  These Care Teams include your primary Cardiologist (physician) and Advanced Practice Providers (APPs -  Physician Assistants and Nurse Practitioners) who all work together to provide you with the care you need, when you need it. You have an upcoming follow up appointment on May 20, 2019 at 3:20 pm with Shelva Majestic, MD.   ADDITIONAL INFORMATION:  [place]: [facility]  YOU HAVE BEEN REFERRED TO VASCULAR SURGERY:  [facility]

## 2019-03-12 NOTE — Telephone Encounter (Signed)
Spoke with patient regarding Carotid Ultrasound scheduled 03/30/19 at 1:00pm at [facility] at 12:30 pm at the outpatient center for registration.  Patient states she will call to reschedule to a later time.  I gave the patient the phone number [phone]---ask for Central Schedulingj

## 2019-03-12 NOTE — Telephone Encounter (Signed)
LMTCB to discuss AVS and stated that AVS would be sent to address on file.  Letter including After Visit Summary and any other necessary documents to be mailed to the patient's address on file.

## 2019-03-12 NOTE — Telephone Encounter (Signed)
Order for Carotid Ultra sound faxed to [facility] [phone]

## 2019-03-16 ENCOUNTER — Telehealth: Payer: Self-pay | Admitting: Physician Assistant

## 2019-03-16 NOTE — Telephone Encounter (Signed)
Called [facility] regarding referral.  Person at [facility] stated referral had to go to a nurse first and then get scheduled, anywhere from 1 to multiple days before it is scheduled.

## 2019-03-20 ENCOUNTER — Telehealth: Payer: Self-pay | Admitting: Physician Assistant

## 2019-03-20 NOTE — Telephone Encounter (Signed)
Called [facility] and they stated that the records are in review by the referral nurse.

## 2019-04-07 ENCOUNTER — Telehealth: Payer: Self-pay | Admitting: Cardiovascular Disease

## 2019-04-07 NOTE — Telephone Encounter (Signed)
Called both the home and cell numbers and both were full and could not leave a msg.  Patient needs to be schedule for a PV evaluation with Dr. Gwenlyn Found.

## 2019-04-07 NOTE — Progress Notes (Signed)
Cardiology Office Note:    Date:  04/08/2019   ID:  Stacey Bowman, DOB Dec 15, 1940, MRN [id number]  PCP:  System, Provider Not In  Cardiologist:  Shelva Majestic, MD   Referring MD: No ref. provider found   Chief Complaint  Patient presents with  . Follow-up    History of Present Illness:    Stacey Bowman is a 78 y.o. 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 At the time of her retinal embolus, Dr. Kellie Simmering recommended right endarterectomy with resection of a redundant artery and removal of kinked segment. She refused this intervention and wanted a second opinion (never followed up). She established cardiology care with Dr. Claiborne Billings and was last seen 06/18/17. At that time, she had some exertional chest tightness relieved with nitro. DOE also reported. Myoview at [facility] was negative for scar or reversible ischemia. Echocardiogram showed EF of 65-70% with mild DD, aortic sclerosis with mild AR, TR.  She had canceled her last 6 cardiology appts. I was able to connect with her through a virtual visit on 03/12/19. I D/C'ed her PRN clonidine and increased her norvasc to 10 mg. I also ordered repeat carotid dopplers. I was very concerned about ongoing TIAs and strongly recommend VVS and neurology follow-ups - we sent referrals for both. Repeat carotid dopplers 03/31/19 with progressive right carotid plaque - recommended PV evaluation by Dr. Gwenlyn Found.   She presents today for follow up.  She reports chest pressure and tightness that radiates up her neck bilaterally. Nitro SL relieves the chest pressure. She gets CP about 5 times per month. CP occurs with exertion and with rest. She is chronically short of breath. Sometimes she feels fatigue and takes a nitro and "just feels better." No nausea, diaphoresis. CP  generally resolves within 10 min of taking nitro. I reviewed her CT chest at [facility] which mentions three-vessel disease.   Past Medical History:  Diagnosis Date  . ADD (attention deficit disorder)    takes Ritalin daily  . Anemia    iron deficiency  . Back pain    stenosis  . Chronic bronchitis    was on Dexamethasone and has been off for surgery  . DDD (degenerative disc disease)   . Depression    takes Prozac daily  . DJD (degenerative joint disease)   . Dysrhythmia    Rt BBB and Tacycardia  . Emphysema    uses Advair inhaler bid and Albuterol prn  . Emphysema   . 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 GERD (gastroesophageal reflux disease)    takes Omeprazole daily  . Headache(784.0)    migraines stopped with menopause  . History of blood transfusion   . History of colon polyps   . History of migraine   . History of staph infection 2013  . Joint pain   . Joint swelling   . Lyme disease 2013  . Muscle spasms of lower extremity    takes Flexeril daily as needed  . Neuropathy   . OCD (obsessive compulsive disorder)   . On home oxygen therapy   . Osteoporosis   . 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 Short-term memory loss   . Shortness of breath    with exertion  . Unspecified essential hypertension  takes Ramipril,Catapres,and Metoprolol daily  . Urinary frequency   . Urinary urgency   . UTI (lower urinary tract infection) hx of   was on a Sulfa med-placed on it 08-05-13 as precaution for surgery    Past Surgical History:  Procedure Laterality Date  . ABDOMINAL HYSTERECTOMY    . CHOLECYSTECTOMY    . COLONOSCOPY    . JOINT REPLACEMENT Left 08/19/13   hip  . Porta catheter Right 2011  . right arm surgery     fracure repair  . right arm surgery    . TOTAL ABDOMINAL HYSTERECTOMY W/ BILATERAL SALPINGOOPHORECTOMY    . TOTAL HIP ARTHROPLASTY Left 08/18/2013   Procedure: LEFT TOTAL HIP ARTHROPLASTY ANTERIOR APPROACH;   Surgeon: Mcarthur Rossetti, MD;  Location: [place];  Service: Orthopedics;  Laterality: Left;  . TOTAL HIP ARTHROPLASTY Right 10/06/2013   DR Ninfa Linden  . TOTAL HIP ARTHROPLASTY Right 10/06/2013   Procedure: RIGHT TOTAL HIP ARTHROPLASTY ANTERIOR APPROACH;  Surgeon: Mcarthur Rossetti, MD;  Location: [place];  Service: Orthopedics;  Laterality: Right;  . WRIST SURGERY Right    ligament repair    Current Medications: Current Meds  Medication Sig  . albuterol (PROVENTIL HFA;VENTOLIN HFA) 108 (90 BASE) MCG/ACT inhaler Inhale 2 puffs into the lungs as needed for wheezing or shortness of breath.  Marland Kitchen ammonium lactate (LAC-HYDRIN) 12 % lotion Apply 1 application topically 3 (three) times a week.  Marland Kitchen apixaban (ELIQUIS) 5 MG TABS tablet Take 5 mg by mouth 2 (two) times daily.  Marland Kitchen aspirin 81 MG tablet Take 1 tablet (81 mg total) by mouth daily.  Marland Kitchen atorvastatin (LIPITOR) 20 MG tablet Take 20 mg by mouth daily.  . benzonatate (TESSALON) 100 MG capsule Take 100 mg by mouth 2 (two) times daily as needed for cough.  Marland Kitchen CARAFATE 1 GM/10ML suspension Take 2 g by mouth daily.   . celecoxib (CELEBREX) 200 MG capsule Take 200 mg by mouth 2 (two) times daily.  . clopidogrel (PLAVIX) 75 MG tablet TAKE 1 TABLET BY MOUTH ONCE DAILY  . clotrimazole (MYCELEX) 10 MG troche Take 10 mg by mouth every 4 (four) hours as needed.  . cyclobenzaprine (FLEXERIL) 10 MG tablet Take 10 mg by mouth at bedtime as needed for muscle spasms. May also take 3 x a week if needed.  . diphenhydrAMINE (BENADRYL) 25 mg capsule Take 25 mg by mouth every 6 (six) hours as needed (cough).  . DULoxetine (CYMBALTA) 30 MG capsule Take 90 mg by mouth daily.  . fluticasone (FLONASE) 50 MCG/ACT nasal spray Place 1 spray into both nostrils daily as needed for allergies or rhinitis.  . folic acid (FOLVITE) 1 MG tablet Take 1 mg by mouth daily.  Marland Kitchen gabapentin (NEURONTIN) 300 MG capsule TAKE 900 MG AT BEDTIME AND 300 MG 1-2 TIMES IN THE DAYTIME.  Marland Kitchen  guaifenesin (HUMIBID E) 400 MG TABS tablet Take 400 mg by mouth 2 (two) times daily as needed (cough).  Marland Kitchen guaiFENesin-codeine (ROBITUSSIN AC) 100-10 MG/5ML syrup Take 5 mLs by mouth 3 (three) times daily as needed for cough.  . levothyroxine (SYNTHROID) 137 MCG tablet Take 137 mcg by mouth daily before breakfast.  . losartan (COZAAR) 25 MG tablet Take 25 mg by mouth daily.  . methylphenidate (RITALIN) 10 MG tablet Take 10 mg by mouth 2 (two) times daily.  . metoprolol succinate (TOPROL-XL) 50 MG 24 hr tablet Take 50 mg by mouth daily. Take with or immediately following a meal.  . montelukast (SINGULAIR) 10  MG tablet Take 10 mg by mouth at bedtime.   . mupirocin ointment (BACTROBAN) 2 % Apply 1 application topically daily as needed (sores on legs).  . nitroGLYCERIN (NITROSTAT) 0.4 MG SL tablet Place 0.4 mg under the tongue every 5 (five) minutes as needed for chest pain.  Marland Kitchen nystatin cream (MYCOSTATIN) Apply 1 application topically 3 (three) times daily as needed for dry skin.  Marland Kitchen omeprazole (PRILOSEC) 20 MG capsule Take 20 mg by mouth daily.  . prochlorperazine (COMPAZINE) 10 MG tablet Take 10 mg by mouth every 4 (four) hours as needed for nausea or vomiting.  . promethazine (PHENERGAN) 25 MG suppository Place 25 mg rectally every 6 (six) hours as needed for nausea or vomiting.  . Vitamin D, Ergocalciferol, (DRISDOL) 50000 UNITS CAPS capsule Take 50,000 Units by mouth every 7 (seven) days. Wednesday  . [DISCONTINUED] amLODipine (NORVASC) 10 MG tablet Take 1 tablet (10 mg total) by mouth daily.  . [DISCONTINUED] amLODipine (NORVASC) 5 MG tablet Take 5 mg by mouth 2 (two) times daily.   Current Facility-Administered Medications for the 04/08/19 encounter (Office Visit) with Ledora Bottcher, PA  Medication  . lidocaine (PF) (XYLOCAINE) 1 % injection 0.3 mL     Allergies:   Patient has no known allergies.   Social History   Socioeconomic History  . Marital status: Widowed    Spouse name: Not  on file  . Number of children: 0  . Years of education: Not on file  . Highest education level: Not on file  Occupational History  . Occupation: retired  Scientific laboratory technician  . Financial resource strain: Not very hard  . Food insecurity    Worry: Never true    Inability: Never true  . Transportation needs    Medical: No    Non-medical: No  Tobacco Use  . Smoking status: Former Smoker    Types: Cigarettes    Quit date: 09/04/1995    Years since quitting: 23.6  . Smokeless tobacco: Never Used  . Tobacco comment: quit in 1997  Substance and Sexual Activity  . Alcohol use: No    Comment: recovering alcoholic, quit in 8341  . Drug use: No  . Sexual activity: Not on file    Comment: quit 1997  Lifestyle  . Physical activity    Days per week: Not on file    Minutes per session: Not on file  . Stress: Not on file  Relationships  . Social Herbalist on phone: Not on file    Gets together: Not on file    Attends religious service: Not on file    Active member of club or organization: Not on file    Attends meetings of clubs or organizations: Not on file    Relationship status: Not on file  Other Topics Concern  . Not on file  Social History Narrative  . Not on file     Family History: The patient's family history includes Cervical cancer in her maternal aunt; Diabetes in her father and maternal grandmother; Emphysema in her mother; Heart disease in her maternal grandmother and mother; Lung disease in her brother.  ROS:   Please see the history of present illness.     All other systems reviewed and are negative.  EKGs/Labs/Other Studies Reviewed:    The following studies were reviewed today:  Carotid dopplers 2020  EKG:  EKG is  ordered today.  The ekg ordered today demonstrates sinus rhythm HR 85 with RBBB  Recent Labs: No results found for requested labs within last 8760 hours.  Recent Lipid Panel No results found for: CHOL, TRIG, HDL, CHOLHDL, VLDL, LDLCALC,  LDLDIRECT  Physical Exam:    VS:  BP (!) 96/59   Pulse 83   Temp (!) 97.3 F (36.3 C)   Ht 5\' 3"  (1.6 m)   Wt 140 lb (63.5 kg)   SpO2 90%   BMI 24.80 kg/m     Wt Readings from Last 3 Encounters:  04/08/19 140 lb (63.5 kg)  03/12/19 135 lb (61.2 kg)  06/18/17 135 lb (61.2 kg)     GEN: Well nourished, well developed in no acute distress HEENT: Normal NECK: No JVD; + carotid bruits LYMPHATICS: No lymphadenopathy CARDIAC: RRR, no murmurs, rubs, gallops RESPIRATORY:  Clear to auscultation without rales, wheezing or rhonchi  ABDOMEN: Soft, non-tender, non-distended MUSCULOSKELETAL:  trace edema; No deformity  SKIN: Warm and dry NEUROLOGIC:  Alert and oriented x 3 PSYCHIATRIC:  Normal affect   ASSESSMENT:    1. Precordial pain   2. Atherosclerosis of native coronary artery of native heart with stable angina pectoris ([place])   3. Essential hypertension, benign   4. Hyperlipidemia with target LDL less than 70   5. Symptomatic stenosis of both carotid arteries    PLAN:    In order of problems listed above:  Precordial pain  Atherosclerosis of native coronary artery of native heart with stable angina pectoris ([place])  Chart reviewed with Dr. Claiborne Billings. She does not wish to proceed with heart catheterization. I will order CT coronary to evaluate atherosclerosis seen on CT chest at [facility]. I will also stop amlodipine and start low dose imdur. Start 15 mg imdur and increase to 30 mg.   Essential hypertension, benign  She states her BP has been well controlled since we increased her norvasc to 10 mg. I will try to change this to imdur to see if this helps with her CP and dyspnea.   Hyperlipidemia with target LDL less than 70  Continue statin  Symptomatic stenosis of both carotid arteries  Dr. Claiborne Billings reviewed and recommended evaluation with Dr. Gwenlyn Found for possible carotid artery stent   Follow up with Dr. Gwenlyn Found for carotid stenosis. Follow up with Dr. Claiborne Billings after CT  coronary.   Medication Adjustments/Labs and Tests Ordered: Current medicines are reviewed at length with the patient today.  Concerns regarding medicines are outlined above.  Orders Placed This Encounter  Procedures  . CT CORONARY MORPH W/CTA COR W/SCORE W/CA W/CM &/OR WO/CM  . CT CORONARY FRACTIONAL FLOW RESERVE DATA PREP  . CT CORONARY FRACTIONAL FLOW RESERVE FLUID ANALYSIS  . Basic metabolic panel  . EKG 12-Lead   Meds ordered this encounter  Medications  . DISCONTD: metoprolol tartrate (LOPRESSOR) 50 MG tablet    Sig: Take 1 tablet (50 mg total) by mouth once for 1 dose.    Dispense:  1 tablet    Refill:  0  . isosorbide mononitrate (IMDUR) 30 MG 24 hr tablet    Sig: Take 1/2 tablet by mouth daily for 3 days; then increase to 1 full tablet (30 mg) daily.    Dispense:  90 tablet    Refill:  3    Signed, Ledora Bottcher, [place]  04/08/2019 5:01 PM    [facility]

## 2019-04-08 ENCOUNTER — Other Ambulatory Visit: Payer: Self-pay

## 2019-04-08 ENCOUNTER — Ambulatory Visit (INDEPENDENT_AMBULATORY_CARE_PROVIDER_SITE_OTHER): Payer: Medicare Other | Admitting: Physician Assistant

## 2019-04-08 ENCOUNTER — Encounter: Payer: Self-pay | Admitting: Physician Assistant

## 2019-04-08 VITALS — BP 96/59 | HR 83 | Temp 97.3°F | Ht 63.0 in | Wt 140.0 lb

## 2019-04-08 DIAGNOSIS — E785 Hyperlipidemia, unspecified: Secondary | ICD-10-CM

## 2019-04-08 DIAGNOSIS — I25118 Atherosclerotic heart disease of native coronary artery with other forms of angina pectoris: Secondary | ICD-10-CM | POA: Diagnosis not present

## 2019-04-08 DIAGNOSIS — I6523 Occlusion and stenosis of bilateral carotid arteries: Secondary | ICD-10-CM

## 2019-04-08 DIAGNOSIS — I10 Essential (primary) hypertension: Secondary | ICD-10-CM | POA: Diagnosis not present

## 2019-04-08 DIAGNOSIS — R072 Precordial pain: Secondary | ICD-10-CM

## 2019-04-08 DIAGNOSIS — I6529 Occlusion and stenosis of unspecified carotid artery: Secondary | ICD-10-CM

## 2019-04-08 MED ORDER — ISOSORBIDE MONONITRATE ER 30 MG PO TB24: ORAL_TABLET | ORAL | 3 refills | Status: DC

## 2019-04-08 MED ORDER — METOPROLOL TARTRATE 50 MG PO TABS: 50.0000 mg | ORAL_TABLET | Freq: Once | ORAL | 0 refills | Status: DC

## 2019-04-08 NOTE — Patient Instructions (Addendum)
Medication Changes: STOP Amlodipine  START Isosorbide 30 mg--take 1/2 tablet (15 mg) daily for 3 days; then increase to one full tablet (30 mg) daily.  Follow-Up:  Please schedule a follow-up appointment with Dr. Gwenlyn Found to monitor your carotid arteries.  Please schedule a follow-up appointment with Dr. Claiborne Billings (ONLY) in 2 months for follow-up of your CTA.   Testing: Your cardiac CT will be scheduled at one of the below locations:   [facility] [address] [place], [place] [address] (336) [place] [address] [place],  [phone]  Please arrive at the [facility] main entrance of [facility] 30-45 minutes prior to test start time. Proceed to the [facility] Radiology Department (first floor) to check-in and test prep.  Please follow these instructions carefully (unless otherwise directed):  On the Night Before the Test: . Be sure to Drink plenty of water. . Do not consume any caffeinated/decaffeinated beverages or chocolate 12 hours prior to your test. . Do not take any antihistamines 12 hours prior to your test.  On the Day of the Test: . Drink plenty of water. Do not drink any water within one hour of the test. . Do not eat any food 4 hours prior to the test. . You may take your regular medications prior to the test.  . Take metoprolol 50 mg tablet two hours prior to test. . FEMALES- please wear underwire-free bra if available       After the Test: . Drink plenty of water. . After receiving IV contrast, you may experience a mild flushed feeling. This is normal. . On occasion, you may experience a mild rash up to 24 hours after the test. This is not dangerous. If this occurs, you can take Benadryl 25 mg and increase your fluid intake. . If you experience trouble breathing, this can be serious. If it is severe call 911 IMMEDIATELY. If it is mild, please call our  office. . If you take any of these medications: Glipizide/Metformin, Avandament, Glucavance, please do not take 48 hours after completing test.    Please contact the cardiac imaging nurse navigator should you have any questions/concerns Marchia Bond, RN Navigator Cardiac [place] [facility] [phone] Office  [phone] Cell

## 2019-04-09 ENCOUNTER — Telehealth: Payer: Self-pay | Admitting: Cardiovascular Disease

## 2019-04-09 NOTE — Telephone Encounter (Signed)
New Message   Vivien Rota calling in from [facility] and states that the office needs to results for the Ultrasound for the appointment. Please fax those results to [phone].

## 2019-04-09 NOTE — Telephone Encounter (Signed)
Sent [place] results- via [employer].

## 2019-04-16 ENCOUNTER — Telehealth: Payer: Self-pay | Admitting: Cardiovascular Disease

## 2019-04-16 NOTE — Telephone Encounter (Signed)
New message;    Patient calling to get a appt. Please call patient back.

## 2019-04-23 ENCOUNTER — Telehealth (HOSPITAL_COMMUNITY): Payer: Self-pay | Admitting: Rehabilitation

## 2019-04-23 NOTE — Telephone Encounter (Signed)

## 2019-04-24 ENCOUNTER — Encounter: Payer: Self-pay | Admitting: Family

## 2019-04-24 ENCOUNTER — Encounter: Payer: Medicare Other | Admitting: Vascular Surgery

## 2019-04-30 ENCOUNTER — Telehealth (HOSPITAL_COMMUNITY): Payer: Self-pay | Admitting: Emergency Medicine

## 2019-04-30 ENCOUNTER — Ambulatory Visit (HOSPITAL_COMMUNITY): Payer: Medicare Other

## 2019-04-30 ENCOUNTER — Ambulatory Visit (HOSPITAL_COMMUNITY): Admission: RE | Admit: 2019-04-30 | Payer: Medicare Other | Source: Ambulatory Visit

## 2019-04-30 NOTE — Telephone Encounter (Signed)
Left message on voicemail with name and callback number Donevin Sainsbury RN Navigator Cardiac Imaging [place] [facility] [phone] Office [phone] Cell  

## 2019-05-20 ENCOUNTER — Ambulatory Visit: Payer: Medicare Other | Admitting: Cardiovascular Disease

## 2019-05-22 ENCOUNTER — Encounter: Payer: Medicare Other | Admitting: Vascular Surgery

## 2019-05-26 ENCOUNTER — Telehealth: Payer: Self-pay | Admitting: Cardiovascular Disease

## 2019-05-26 NOTE — Telephone Encounter (Signed)
Please advise-  If okay, thank you!

## 2019-05-26 NOTE — Telephone Encounter (Signed)
  Patient has appt with Fabian Sharp on 06/16/19 and will need her caregiver to come with her since she is in a wheelchair

## 2019-05-28 NOTE — Telephone Encounter (Signed)
That's fine. Thanks Angie

## 2019-06-15 NOTE — Progress Notes (Signed)
Cardiology Office Note:    Date:  06/16/2019   ID:  Stacey Bowman, DOB 08/28/1941, MRN [id number]  PCP:  System, Provider Not In  Cardiologist:  Shelva Majestic, MD   Referring MD: No ref. provider found   Chief Complaint  Patient presents with  . Follow-up    chest pain    History of Present Illness:    Stacey Bowman is a 78 y.o. 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 At the time of her retinal embolus, Dr. Kellie Simmering recommended right endarterectomy with resection of a redundant artery and removal of kinked segment. She refused this intervention and wanted a second opinion (never followed up). She established cardiology care with Dr. Claiborne Billings and was last seen 06/18/17. At that time, she had some exertional chest tightness relieved with nitro. DOE also reported. Myoview at [facility] was negative for scar or reversible ischemia. Echocardiogram showed EF of 65-70% with mild DD, aortic sclerosis with mild AR, TR. She was diagnosed with a DVT 06/20/18 and was started on treatment dose of eliquis. She continues to take ASA and eliquis, She is not taking plavix.   She had canceled her last 6 cardiology appts. I was able to connect with her through a virtual visit on 03/12/19. I D/C'ed her PRN clonidine and increased her norvasc to 10 mg. I also ordered repeat carotid dopplers. I was very concerned about ongoing TIAs and strongly recommend [facility] and neurology follow-ups - we sent referrals for both. Repeat carotid dopplers 03/31/19 with progressive right carotid plaque - recommended PV evaluation by Dr. Gwenlyn Found. Case was reviewed with Dr. Claiborne Billings. Pt did not want a heart catheterization at that time, but agreed to CT coronary.  It appears that was scheduled twice, but she canceled. I D/C'ed amlodipine and  started low dose imdur. Since then, she has canceled two appts with [facility].   She presents today for follow up. She forgets to take ASA.   She stopped imdur because it caused her to feel sluggish and ankle swelling. 10 days later, she started feeling poorly and stopped the imdur. However, stopping it did not resolve her complaints. She does report hypotension in imdur - 100/56, 112/61, 107/61.  The imdur did resolve her chest pain. She also ran out of losartan - I will refill this.  She states she continues to feel poorly - she reports ongoing significant dyspnea on exertion. She can't walk from room to room in her house without significant dyspnea. She is on 5L supplemental O2 here. She states her DOE worsened when I prescribed her imdur.   We discussed pursuing the CT coronary and echocardiogram.   I will increase her lipitor to 40 mg with plans to eventually titrate her to 80 mg. I will send a message to Dr. Claiborne Billings regarding length of therapy for eliquis. Her DVT sounds unprovoked. I will also ask if she would benefit from taking plavix instead of ASA; however, she is an increased fall risk.    Past Medical History:  Diagnosis Date  . ADD (attention deficit disorder)    takes Ritalin daily  . Anemia    iron deficiency  . Back pain    stenosis  . Chronic bronchitis    was on Dexamethasone and has been off for surgery  . DDD (degenerative disc disease)   .  Depression    takes Prozac daily  . DJD (degenerative joint disease)   . Dysrhythmia    Rt BBB and Tacycardia  . Emphysema    uses Advair inhaler bid and Albuterol prn  . Emphysema   . 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 GERD (gastroesophageal reflux disease)    takes Omeprazole daily  . Headache(784.0)    migraines stopped with menopause  . History of blood transfusion   . History of colon polyps   . History of migraine   . History of staph infection 2013  . Joint pain   . Joint swelling   . Lyme disease 2013  . Muscle  spasms of lower extremity    takes Flexeril daily as needed  . Neuropathy   . OCD (obsessive compulsive disorder)   . On home oxygen therapy   . Osteoporosis   . 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 Short-term memory loss   . Shortness of breath    with exertion  . Unspecified essential hypertension    takes Ramipril,Catapres,and Metoprolol daily  . Urinary frequency   . Urinary urgency   . UTI (lower urinary tract infection) hx of   was on a Sulfa med-placed on it 08-05-13 as precaution for surgery    Past Surgical History:  Procedure Laterality Date  . ABDOMINAL HYSTERECTOMY    . CHOLECYSTECTOMY    . COLONOSCOPY    . JOINT REPLACEMENT Left 08/19/13   hip  . Porta catheter Right 2011  . right arm surgery     fracure repair  . right arm surgery    . TOTAL ABDOMINAL HYSTERECTOMY W/ BILATERAL SALPINGOOPHORECTOMY    . TOTAL HIP ARTHROPLASTY Left 08/18/2013   Procedure: LEFT TOTAL HIP ARTHROPLASTY ANTERIOR APPROACH;  Surgeon: Mcarthur Rossetti, MD;  Location: [place];  Service: Orthopedics;  Laterality: Left;  . TOTAL HIP ARTHROPLASTY Right 10/06/2013   DR Ninfa Linden  . TOTAL HIP ARTHROPLASTY Right 10/06/2013   Procedure: RIGHT TOTAL HIP ARTHROPLASTY ANTERIOR APPROACH;  Surgeon: Mcarthur Rossetti, MD;  Location: [place];  Service: Orthopedics;  Laterality: Right;  . WRIST SURGERY Right    ligament repair    Current Medications: Current Meds  Medication Sig  . albuterol (PROVENTIL HFA;VENTOLIN HFA) 108 (90 BASE) MCG/ACT inhaler Inhale 2 puffs into the lungs as needed for wheezing or shortness of breath.  Marland Kitchen ammonium lactate (LAC-HYDRIN) 12 % lotion Apply 1 application topically 3 (three) times a week.  Marland Kitchen apixaban (ELIQUIS) 5 MG TABS tablet Take 5 mg by mouth 2 (two) times daily.  Marland Kitchen aspirin 81 MG tablet Take 1 tablet (81 mg total) by mouth daily.  Marland Kitchen atorvastatin (LIPITOR) 20 MG tablet Take 20 mg by mouth daily.  . benzonatate (TESSALON)  100 MG capsule Take 100 mg by mouth 2 (two) times daily as needed for cough.  Marland Kitchen CARAFATE 1 GM/10ML suspension Take 2 g by mouth daily.   . celecoxib (CELEBREX) 200 MG capsule Take 200 mg by mouth 2 (two) times daily.  . clotrimazole (MYCELEX) 10 MG troche Take 10 mg by mouth every 4 (four) hours as needed.  . cyclobenzaprine (FLEXERIL) 10 MG tablet Take 10 mg by mouth at bedtime as needed for muscle spasms. May also take 3 x a week if needed.  . diphenhydrAMINE (BENADRYL) 25 mg capsule Take 25 mg by mouth every 6 (six) hours as needed (cough).  . DULoxetine (CYMBALTA) 30 MG capsule Take 90  mg by mouth daily.  . fluticasone (FLONASE) 50 MCG/ACT nasal spray Place 1 spray into both nostrils daily as needed for allergies or rhinitis.  . folic acid (FOLVITE) 1 MG tablet Take 1 mg by mouth daily.  Marland Kitchen gabapentin (NEURONTIN) 300 MG capsule TAKE 900 MG AT BEDTIME AND 300 MG 1-2 TIMES IN THE DAYTIME.  Marland Kitchen guaifenesin (HUMIBID E) 400 MG TABS tablet Take 400 mg by mouth 2 (two) times daily as needed (cough).  Marland Kitchen guaiFENesin-codeine (ROBITUSSIN AC) 100-10 MG/5ML syrup Take 5 mLs by mouth 3 (three) times daily as needed for cough.  . levothyroxine (SYNTHROID) 137 MCG tablet Take 137 mcg by mouth daily before breakfast.  . methylphenidate (RITALIN) 10 MG tablet Take 10 mg by mouth 2 (two) times daily.  . metoprolol succinate (TOPROL-XL) 25 MG 24 hr tablet Take 1 tablet (25 mg total) by mouth daily. Take with or immediately following a meal.  . montelukast (SINGULAIR) 10 MG tablet Take 10 mg by mouth at bedtime.   . mupirocin ointment (BACTROBAN) 2 % Apply 1 application topically daily as needed (sores on legs).  . nitroGLYCERIN (NITROSTAT) 0.4 MG SL tablet Place 0.4 mg under the tongue every 5 (five) minutes as needed for chest pain.  Marland Kitchen nystatin cream (MYCOSTATIN) Apply 1 application topically 3 (three) times daily as needed for dry skin.  Marland Kitchen omeprazole (PRILOSEC) 20 MG capsule Take 20 mg by mouth daily.  .  prochlorperazine (COMPAZINE) 10 MG tablet Take 10 mg by mouth every 4 (four) hours as needed for nausea or vomiting.  . promethazine (PHENERGAN) 25 MG suppository Place 25 mg rectally every 6 (six) hours as needed for nausea or vomiting.  . Vitamin D, Ergocalciferol, (DRISDOL) 50000 UNITS CAPS capsule Take 50,000 Units by mouth every 7 (seven) days. Wednesday  . [DISCONTINUED] clopidogrel (PLAVIX) 75 MG tablet TAKE 1 TABLET BY MOUTH ONCE DAILY  . [DISCONTINUED] isosorbide mononitrate (IMDUR) 30 MG 24 hr tablet Take 1/2 tablet by mouth daily for 3 days; then increase to 1 full tablet (30 mg) daily.  . [DISCONTINUED] losartan (COZAAR) 25 MG tablet Take 25 mg by mouth daily.  . [DISCONTINUED] metoprolol succinate (TOPROL-XL) 50 MG 24 hr tablet Take 50 mg by mouth daily. Take with or immediately following a meal.   Current Facility-Administered Medications for the 06/16/19 encounter (Office Visit) with Ledora Bottcher, PA  Medication  . lidocaine (PF) (XYLOCAINE) 1 % injection 0.3 mL     Allergies:   Patient has no known allergies.   Social History   Socioeconomic History  . Marital status: Widowed    Spouse name: Not on file  . Number of children: 0  . Years of education: Not on file  . Highest education level: Not on file  Occupational History  . Occupation: retired  Scientific laboratory technician  . Financial resource strain: Not very hard  . Food insecurity    Worry: Never true    Inability: Never true  . Transportation needs    Medical: No    Non-medical: No  Tobacco Use  . Smoking status: Former Smoker    Types: Cigarettes    Quit date: 09/04/1995    Years since quitting: 23.7  . Smokeless tobacco: Never Used  . Tobacco comment: quit in 1997  Substance and Sexual Activity  . Alcohol use: No    Comment: recovering alcoholic, quit in 0000000  . Drug use: No  . Sexual activity: Not on file    Comment: quit 1997  Lifestyle  .  Physical activity    Days per week: Not on file    Minutes per  session: Not on file  . Stress: Not on file  Relationships  . Social Herbalist on phone: Not on file    Gets together: Not on file    Attends religious service: Not on file    Active member of club or organization: Not on file    Attends meetings of clubs or organizations: Not on file    Relationship status: Not on file  Other Topics Concern  . Not on file  Social History Narrative  . Not on file     Family History: The patient's family history includes Cervical cancer in her maternal aunt; Diabetes in her father and maternal grandmother; Emphysema in her mother; Heart disease in her maternal grandmother and mother; Lung disease in her brother.  ROS:   Please see the history of present illness.     All other systems reviewed and are negative.  EKGs/Labs/Other Studies Reviewed:    The following studies were reviewed today:  none  EKG:  EKG is not ordered today.    Recent Labs: No results found for requested labs within last 8760 hours.  Recent Lipid Panel No results found for: CHOL, TRIG, HDL, CHOLHDL, VLDL, LDLCALC, LDLDIRECT  Physical Exam:    VS:  BP 135/70   Pulse (!) 101   Ht 5\' 2"  (1.575 m)   Wt 144 lb (65.3 kg)   SpO2 95%   BMI 26.34 kg/m     Wt Readings from Last 3 Encounters:  06/16/19 144 lb (65.3 kg)  04/08/19 140 lb (63.5 kg)  03/12/19 135 lb (61.2 kg)     GEN: elderly female in no acute distress in wheelchair HEENT: Normal NECK: minimal JVD LYMPHATICS: No lymphadenopathy CARDIAC: RRR, no murmurs, rubs, gallops RESPIRATORY:  Respirations labored with activity, coarse sounds in bases, on 5 L O2  ABDOMEN: Soft, non-tender, non-distended MUSCULOSKELETAL:  1+ B LE edema; No deformity  SKIN: Warm and dry NEUROLOGIC:  Alert and oriented x 3 PSYCHIATRIC:  Normal affect   ASSESSMENT:    1. Atherosclerosis of native coronary artery of native heart with stable angina pectoris ([place])   2. Symptomatic stenosis of both carotid arteries   3.  Essential hypertension, benign   4. Hyperlipidemia with target LDL less than 70   5. Precordial pain   6. Shortness of breath   7. Lower extremity edema   8. Venous insufficiency of both lower extremities    PLAN:    In order of problems listed above:  DOE Likely multifactorial given her CAD, carotid artery disease, and COPD. She is on 5L O2. She wants to try to decrease her torpol to see if this helps her feel better. I decreased her form 50 mg to 25 mg.    Symptomatic stenosis of both carotid arteries She has an appt with [facility] on 07/03/19. She may benefit from a referral to Dr. Gwenlyn Found. I suspect she may not be a candidate for an invasive carotid artery procedure.    Hyperlipidemia I will increase her lipitor to 40 mg with plans to titrate to 80 mg given her disease.    Essential hypertension She is taking losartan and losartan. Pressure is stable. No chang in therapy. She wants to decrease toprol 25 mg to see if this will help her complaints of fatigue. I am fine with this.    Precordial pain atherosclerosis of native coronary artery  of native heart with stable angina DOE She did not have her CT coronary. Unclear if her shortness of breath is due to carotid disease or possibly her anginal equivalent. I will re-order CT coronary and add echocardiogram.  She is not taking imdur. She is concerned about hypotension.  I will hold off on starting amlodipine as an antianginal for now. If she calls with CP, will start 5 mg amlodipine and decrease losartan.   Lower extremity swelling Swelling improves with elevation and in the morning. Likely a component of venous insufficiency. I recommended compression socks.    Follow up with Dr. Claiborne Billings in 3 months - doctor only.    Medication Adjustments/Labs and Tests Ordered: Current medicines are reviewed at length with the patient today.  Concerns regarding medicines are outlined above.  Orders Placed This Encounter  Procedures  . CT  CORONARY MORPH W/CTA COR W/SCORE W/CA W/CM &/OR WO/CM  . CT CORONARY FRACTIONAL FLOW RESERVE DATA PREP  . CT CORONARY FRACTIONAL FLOW RESERVE FLUID ANALYSIS  . Basic metabolic panel  . ECHOCARDIOGRAM COMPLETE   Meds ordered this encounter  Medications  . DISCONTD: losartan (COZAAR) 25 MG tablet    Sig: Take 1 tablet (25 mg total) by mouth daily.    Dispense:  90 tablet    Refill:  3  . losartan (COZAAR) 25 MG tablet    Sig: Take 1 tablet (25 mg total) by mouth daily.    Dispense:  90 tablet    Refill:  3  . metoprolol succinate (TOPROL-XL) 25 MG 24 hr tablet    Sig: Take 1 tablet (25 mg total) by mouth daily. Take with or immediately following a meal.    Dispense:  90 tablet    Refill:  1  . metoprolol tartrate (LOPRESSOR) 25 MG tablet    Sig: Take 1 tablet (25 mg total) by mouth once for 1 dose. Take with you to CTA.    Dispense:  4 tablet    Refill:  0    Signed, Ledora Bottcher, [place]  06/16/2019 5:05 PM    [facility]

## 2019-06-16 ENCOUNTER — Ambulatory Visit (INDEPENDENT_AMBULATORY_CARE_PROVIDER_SITE_OTHER): Payer: Medicare Other | Admitting: Physician Assistant

## 2019-06-16 ENCOUNTER — Other Ambulatory Visit: Payer: Self-pay

## 2019-06-16 ENCOUNTER — Encounter: Payer: Self-pay | Admitting: Physician Assistant

## 2019-06-16 VITALS — BP 135/70 | HR 101 | Ht 62.0 in | Wt 144.0 lb

## 2019-06-16 DIAGNOSIS — I10 Essential (primary) hypertension: Secondary | ICD-10-CM

## 2019-06-16 DIAGNOSIS — R072 Precordial pain: Secondary | ICD-10-CM

## 2019-06-16 DIAGNOSIS — I25118 Atherosclerotic heart disease of native coronary artery with other forms of angina pectoris: Secondary | ICD-10-CM | POA: Diagnosis not present

## 2019-06-16 DIAGNOSIS — R600 Localized edema: Secondary | ICD-10-CM

## 2019-06-16 DIAGNOSIS — I6523 Occlusion and stenosis of bilateral carotid arteries: Secondary | ICD-10-CM

## 2019-06-16 DIAGNOSIS — E785 Hyperlipidemia, unspecified: Secondary | ICD-10-CM

## 2019-06-16 DIAGNOSIS — R0602 Shortness of breath: Secondary | ICD-10-CM

## 2019-06-16 DIAGNOSIS — I872 Venous insufficiency (chronic) (peripheral): Secondary | ICD-10-CM

## 2019-06-16 MED ORDER — METOPROLOL TARTRATE 25 MG PO TABS: 25.0000 mg | ORAL_TABLET | Freq: Once | ORAL | 0 refills | Status: DC

## 2019-06-16 MED ORDER — LOSARTAN POTASSIUM 25 MG PO TABS: 25.0000 mg | ORAL_TABLET | Freq: Every day | ORAL | 3 refills | Status: DC

## 2019-06-16 MED ORDER — METOPROLOL SUCCINATE ER 25 MG PO TB24: 25.0000 mg | ORAL_TABLET | Freq: Every day | ORAL | 1 refills | Status: DC

## 2019-06-16 NOTE — Patient Instructions (Addendum)
Follow-Up: At [facility], you and your health needs are our priority.  As part of our continuing mission to provide you with exceptional heart care, we have created designated Provider Care Teams.  These Care Teams include your primary Cardiologist (physician) and Advanced Practice Providers (APPs -  Physician Assistants and Nurse Practitioners) who all work together to provide you with the care you need, when you need it.  You have been scheduled for a follow-up IN OFFICE visit with Dr. Shelva Majestic on Wednesday, 09/16/19 at 3:20 PM.   Medication Instructions:   Decrease Metoprolol Succinate (TOPROL) to 25 mg daily.  Take Metoprolol Tartrate 25 mg (4 tab) with you to CTA scan.   Lab work: Your physician recommends that you return for lab work within one week of scheduled CTA: BMET  If you have labs (blood work) drawn today and your tests are completely normal, you will receive your results only by: Marland Kitchen MyChart Message (if you have MyChart) OR . A paper copy in the mail If you have any lab test that is abnormal or we need to change your treatment, we will call you to review the results.  Testing/Procedures: Your physician has requested that you have an echocardiogram. Echocardiography is a painless test that uses sound waves to create images of your heart. It provides your doctor with information about the size and shape of your heart and how well your heart's chambers and valves are working. This procedure takes approximately one hour. There are no restrictions for this procedure.   Coronary CTA:  Your cardiac CT will be scheduled at one of the below locations:   [facility] [address] [place], [place] [address] [phone]  If scheduled at [facility], please arrive at the [facility] main entrance of [facility] 30-45 minutes prior to test start time.  Proceed to the [facility] Radiology Department (first floor) to check-in and test  prep.   Please follow these instructions carefully (unless otherwise directed):   On the Night Before the Test: . Be sure to Drink plenty of water. . Do not consume any caffeinated/decaffeinated beverages or chocolate 12 hours prior to your test. . Do not take any antihistamines 12 hours prior to your test.  On the Day of the Test: . Drink plenty of water. Do not drink any water within one hour of the test. . Do not eat any food 4 hours prior to the test. . You may take your regular medications prior to the test.  . Take metoprolol  . HOLD Furosemide/Hydrochlorothiazide morning of the test. . FEMALES- please wear underwire-free bra if available       After the Test: . Drink plenty of water. . After receiving IV contrast, you may experience a mild flushed feeling. This is normal. . On occasion, you may experience a mild rash up to 24 hours after the test. This is not dangerous. If this occurs, you can take Benadryl 25 mg and increase your fluid intake. . If you experience trouble breathing, this can be serious. If it is severe call 911 IMMEDIATELY. If it is mild, please call our office. . If you take any of these medications: Glipizide/Metformin, Avandament, Glucavance, please do not take 48 hours after completing test unless otherwise instructed.    Please contact the cardiac imaging nurse navigator should you have any questions/concerns Marchia Bond, RN Navigator Cardiac Imaging [facility] Heart and Vascular Services [phone] Office

## 2019-06-24 ENCOUNTER — Other Ambulatory Visit: Payer: Self-pay

## 2019-06-24 ENCOUNTER — Ambulatory Visit (HOSPITAL_COMMUNITY): Payer: Medicare Other | Attending: Physician Assistant

## 2019-06-24 DIAGNOSIS — I25118 Atherosclerotic heart disease of native coronary artery with other forms of angina pectoris: Secondary | ICD-10-CM

## 2019-06-24 DIAGNOSIS — R072 Precordial pain: Secondary | ICD-10-CM | POA: Diagnosis present

## 2019-06-24 DIAGNOSIS — R0602 Shortness of breath: Secondary | ICD-10-CM

## 2019-07-03 ENCOUNTER — Ambulatory Visit (INDEPENDENT_AMBULATORY_CARE_PROVIDER_SITE_OTHER): Payer: Medicare Other | Admitting: Vascular Surgery

## 2019-07-03 ENCOUNTER — Encounter: Payer: Self-pay | Admitting: Vascular Surgery

## 2019-07-03 ENCOUNTER — Other Ambulatory Visit: Payer: Self-pay

## 2019-07-03 VITALS — BP 137/84 | HR 116 | Temp 98.5°F | Resp 20 | Ht 62.0 in | Wt 140.0 lb

## 2019-07-03 DIAGNOSIS — I6523 Occlusion and stenosis of bilateral carotid arteries: Secondary | ICD-10-CM

## 2019-07-03 NOTE — Progress Notes (Signed)
Patient ID: Stacey Bowman, female   DOB: [birth year], 78 y.o.   MRN: [id number]  Reason for Consult: New Patient (Initial Visit) (Internal Referral, Fabian Sharp, [place] [phone].  Carotid Stenosis. )   Referred by Ledora Bottcher, PA  Subjective:     HPI:  Stacey Bowman is a 78 y.o. female has been seen in the past by Dr. Kellie Simmering was  recommended to have right carotid endarterectomy.  This was for acute retinal artery occlusion.  She denies any further retinal issues.  Has not had a frank stroke TIA or amaurosis since that time.  She states that she does take Eliquis forgets to take her aspirin.  She was recently evaluated with repeat carotid duplex.  She is now sent for further evaluation and work-up.  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   GERD (gastroesophageal reflux disease)    takes Omeprazole daily   Headache(784.0)    migraines stopped with menopause   History of blood transfusion    History of colon polyps    History of migraine    History of staph infection 2013   Joint pain    Joint swelling    Lyme disease 2013   Muscle spasms of lower extremity    takes Flexeril daily as needed   Neuropathy    OCD (obsessive compulsive disorder)    On home oxygen therapy    Osteoporosis    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[place])    was taking steroids-came off in May 2014   Short-term memory loss    Shortness of breath    with exertion   Unspecified essential hypertension    takes Ramipril,Catapres,and Metoprolol  daily   Urinary frequency    Urinary urgency    UTI (lower urinary tract infection) hx of   was on a Sulfa med-placed on it 08-05-13 as precaution for surgery   Family History  Problem Relation Age of Onset   Cervical cancer Maternal Aunt    Emphysema Mother    Heart disease Mother    Diabetes Father    Diabetes Maternal Grandmother    Heart disease Maternal Grandmother    Lung disease Brother    Past Surgical History:  Procedure Laterality Date   ABDOMINAL HYSTERECTOMY     CHOLECYSTECTOMY     COLONOSCOPY     JOINT REPLACEMENT Left 08/19/13   hip   Porta catheter Right 2011   right arm surgery     fracure repair   right arm surgery     TOTAL ABDOMINAL HYSTERECTOMY W/ BILATERAL SALPINGOOPHORECTOMY     TOTAL HIP ARTHROPLASTY Left 08/18/2013   Procedure: LEFT TOTAL HIP ARTHROPLASTY ANTERIOR APPROACH;  Surgeon: Mcarthur Rossetti, MD;  Location: [facility];  Service: Orthopedics;  Laterality: Left;   TOTAL HIP ARTHROPLASTY Right 10/06/2013   DR [facility]   TOTAL HIP ARTHROPLASTY Right 10/06/2013   Procedure: RIGHT TOTAL HIP ARTHROPLASTY ANTERIOR APPROACH;  Surgeon: Mcarthur Rossetti, MD;  Location: [facility];  Service: Orthopedics;  Laterality: Right;   WRIST SURGERY Right    ligament repair    Short Social History:  Social History   Tobacco Use   Smoking status: Former Smoker    Types: Cigarettes    Quit date: 09/04/1995    Years since quitting: 23.8   Smokeless tobacco: Never Used   Tobacco comment: quit in 1997  Substance Use Topics   Alcohol use: No    Comment: recovering alcoholic, quit in 0000000    No Known Allergies  Current Outpatient Medications  Medication Sig Dispense Refill   albuterol (PROVENTIL HFA;VENTOLIN HFA) 108 (90 BASE) MCG/ACT inhaler Inhale 2 puffs into the lungs as needed for wheezing or shortness of breath.     ammonium lactate (LAC-HYDRIN) 12 % lotion Apply 1 application topically 3 (three) times a week.     apixaban  (ELIQUIS) 5 MG TABS tablet Take 5 mg by mouth 2 (two) times daily.     aspirin 81 MG tablet Take 1 tablet (81 mg total) by mouth daily. 30 tablet    atorvastatin (LIPITOR) 20 MG tablet Take 20 mg by mouth daily.     benzonatate (TESSALON) 100 MG capsule Take 100 mg by mouth 2 (two) times daily as needed for cough.     CARAFATE 1 GM/10ML suspension Take 2 g by mouth daily.      celecoxib (CELEBREX) 200 MG capsule Take 200 mg by mouth 2 (two) times daily.     clotrimazole (MYCELEX) 10 MG troche Take 10 mg by mouth every 4 (four) hours as needed.     cyclobenzaprine (FLEXERIL) 10 MG tablet Take 10 mg by mouth at bedtime as needed for muscle spasms. May also take 3 x a week if needed.     diphenhydrAMINE (BENADRYL) 25 mg capsule Take 25 mg by mouth every 6 (six) hours as needed (cough).     DULoxetine (CYMBALTA) 30 MG capsule Take 90 mg by mouth daily.     fluticasone (FLONASE) 50 MCG/ACT nasal spray Place 1 spray into both nostrils daily as needed for allergies or rhinitis.     folic acid (FOLVITE) 1 MG tablet Take 1 mg by mouth daily.     gabapentin (NEURONTIN) 300 MG capsule TAKE 900 MG AT BEDTIME AND 300 MG 1-2 TIMES IN THE DAYTIME.     guaifenesin (HUMIBID E) 400 MG TABS tablet Take 400 mg by mouth 2 (two) times daily as needed (cough).     guaiFENesin-codeine (ROBITUSSIN AC) 100-10 MG/5ML syrup Take 5 mLs by mouth 3 (three) times daily as needed for cough.     levothyroxine (SYNTHROID) 137 MCG tablet Take 137 mcg by mouth daily before breakfast.     losartan (COZAAR) 25 MG tablet Take 1 tablet (25 mg total) by mouth daily. 90 tablet 3   methylphenidate (RITALIN) 10 MG tablet Take 10 mg by mouth 2 (two) times daily.     metoprolol succinate (TOPROL-XL) 25 MG 24 hr tablet Take 1 tablet (25 mg total) by mouth daily. Take with or immediately following a meal. 90 tablet 1   metoprolol tartrate (LOPRESSOR) 25 MG tablet Take 1 tablet (25 mg total) by mouth once for 1 dose. Take with  you to CTA. 4 tablet 0   montelukast (SINGULAIR) 10 MG tablet Take 10 mg by mouth at bedtime.      mupirocin ointment (BACTROBAN) 2 % Apply 1 application topically daily as needed (sores on  legs).     nitroGLYCERIN (NITROSTAT) 0.4 MG SL tablet Place 0.4 mg under the tongue every 5 (five) minutes as needed for chest pain.     nystatin cream (MYCOSTATIN) Apply 1 application topically 3 (three) times daily as needed for dry skin.     omeprazole (PRILOSEC) 20 MG capsule Take 20 mg by mouth daily.     prochlorperazine (COMPAZINE) 10 MG tablet Take 10 mg by mouth every 4 (four) hours as needed for nausea or vomiting.     promethazine (PHENERGAN) 25 MG suppository Place 25 mg rectally every 6 (six) hours as needed for nausea or vomiting.     Vitamin D, Ergocalciferol, (DRISDOL) 50000 UNITS CAPS capsule Take 50,000 Units by mouth every 7 (seven) days. Wednesday     Current Facility-Administered Medications  Medication Dose Route Frequency Provider Last Rate Last Dose   lidocaine (PF) (XYLOCAINE) 1 % injection 0.3 mL  0.3 mL Other Once Magnus Sinning, MD        Review of Systems  Constitutional:  Constitutional negative. HENT: HENT negative.  Eyes: Positive for loss of vision.   Respiratory: Positive for shortness of breath.  Cardiovascular: Cardiovascular negative.  GI: Gastrointestinal negative.  Musculoskeletal: Musculoskeletal negative.  Skin: Skin negative.  Neurological: Neurological negative. Hematologic: Hematologic/lymphatic negative.  Psychiatric: Psychiatric negative.        Objective:  Objective   Vitals:   07/03/19 1349 07/03/19 1354  BP: 132/86 137/84  Pulse: (!) 116   Resp: 20   Temp: 98.5 F (36.9 C)   SpO2: 90%     Physical Exam Constitutional:      Appearance: Normal appearance.  HENT:     Mouth/Throat:     Mouth: Mucous membranes are moist.  Eyes:     Pupils: Pupils are equal, round, and reactive to light.  Neck:     Vascular: No carotid bruit.   Cardiovascular:     Rate and Rhythm: Normal rate and regular rhythm.     Heart sounds: Normal heart sounds.  Pulmonary:     Effort: Pulmonary effort is normal.     Breath sounds: Normal breath sounds.  Abdominal:     General: Abdomen is flat.     Palpations: Abdomen is soft.  Musculoskeletal: Normal range of motion.        General: No swelling.  Skin:    General: Skin is warm.     Capillary Refill: Capillary refill takes less than 2 seconds.  Neurological:     General: No focal deficit present.     Mental Status: She is alert.  Psychiatric:        Mood and Affect: Mood normal.        Thought Content: Thought content normal.        Judgment: Judgment normal.     Data: I reviewed her carotid duplex performed at [facility] in July of this year.  There is bulky calcification 70 to 99% stenosis on the right and 1 to 49% stenosis on the left internal carotid arteries     Assessment/Plan:     78 year old female previously thought to have symptomatic right ICA stenosis.  At one point she was scheduled for carotid endarterectomy but ultimately canceled the operation.  At the time she had retinal artery occlusion.  She has had no further symptoms.  She is wonder why she is not had CT scan.  Given her high-grade stenosis on the right we will get the CT scan as she would possibly  be a candidate for transcarotid artery stenting if there is indeed very high-grade stenosis.  Given her requirement of home oxygen she may be best served with medical therapy and I discussed this with her today.  We will get a CT and have her follow-up to discuss possibilities moving forward.    Waynetta Sandy MD [facility]

## 2019-07-15 ENCOUNTER — Other Ambulatory Visit: Payer: Self-pay | Admitting: Vascular Surgery

## 2019-07-15 DIAGNOSIS — I6523 Occlusion and stenosis of bilateral carotid arteries: Secondary | ICD-10-CM

## 2019-07-28 ENCOUNTER — Telehealth (HOSPITAL_COMMUNITY): Payer: Self-pay | Admitting: Emergency Medicine

## 2019-07-28 NOTE — Telephone Encounter (Signed)
Left message on voicemail with name and callback number Marchia Bond RN Navigator Cardiac Imaging [facility] [phone] Office [phone] Cell  Requested lab draw, orders in

## 2019-07-29 ENCOUNTER — Encounter (HOSPITAL_COMMUNITY): Payer: Self-pay

## 2019-07-29 ENCOUNTER — Other Ambulatory Visit: Payer: Self-pay

## 2019-07-29 ENCOUNTER — Ambulatory Visit (HOSPITAL_COMMUNITY)
Admission: RE | Admit: 2019-07-29 | Discharge: 2019-07-29 | Disposition: A | Discharge status: Home / Self Care | Payer: Medicare Other | Source: Ambulatory Visit | Attending: Physician Assistant | Admitting: Physician Assistant

## 2019-07-29 ENCOUNTER — Telehealth: Payer: Self-pay | Admitting: Physician Assistant

## 2019-07-29 DIAGNOSIS — R072 Precordial pain: Secondary | ICD-10-CM

## 2019-07-29 MED ORDER — METOPROLOL TARTRATE 50 MG PO TABS
ORAL_TABLET | ORAL | Status: AC
  Filled 2019-07-29: qty 1

## 2019-07-29 NOTE — Telephone Encounter (Signed)
Pt presented for CT coronary. Unfortunately, she was noted to be in atrial flutter over 100 bpm. Pt did not want to take lopressor due to a history of bradycardia. - message per Dr. Oval Linsey.   She will either need to be converted to sinus or will need a nuclear stress test.  Please have her seen in clinic at first available, preferably with Dr. Claiborne Billings.

## 2019-07-29 NOTE — Telephone Encounter (Signed)
Left detailed message on patients machine advising her of her upcoming appt ate and time with Dr Claiborne Billings. Advised patient to contact office if appt date and time does not work for her. Mychart message sent as well with the appt date and time.

## 2019-07-29 NOTE — Progress Notes (Signed)
Here for CT heart. On 02 3L at her request (on home 02 at 5L with long tubing). SP02 100%. Placed on monitor, in Atrial fib/flutter at rate of 110-130. Took her normal dose of Metoprolol but not dose ordered prior to test.. States her BP falls when her heart rate drops to 70 and she does not feel well then. BP 140/77. Spoke with Dr Oval Linsey about these findings and she cancelled the test. She will speak with A Duke PA to decide on a different test to evaluate her heart. This was explained to the patient. She was disappointed but understands. Discharged in w/c on her own 02 tank at 3L [place] to be driven home by her friend.

## 2019-07-29 NOTE — Progress Notes (Signed)
Also told me on arrival for CT heart that her heart rate is fast, around 120

## 2019-08-06 ENCOUNTER — Ambulatory Visit: Payer: Medicare Other | Admitting: Cardiovascular Disease

## 2019-08-17 ENCOUNTER — Other Ambulatory Visit: Payer: Medicare Other

## 2019-08-21 ENCOUNTER — Ambulatory Visit: Payer: Medicare Other | Admitting: Vascular Surgery

## 2019-08-26 ENCOUNTER — Other Ambulatory Visit: Payer: Medicare Other

## 2019-09-16 ENCOUNTER — Ambulatory Visit: Payer: Medicare Other | Admitting: Cardiovascular Disease

## 2019-09-17 ENCOUNTER — Other Ambulatory Visit: Payer: Medicare Other

## 2019-09-25 ENCOUNTER — Ambulatory Visit: Payer: Medicare Other | Admitting: Vascular Surgery

## 2019-10-12 ENCOUNTER — Other Ambulatory Visit: Payer: Self-pay | Admitting: Vascular Surgery

## 2019-10-12 DIAGNOSIS — I6523 Occlusion and stenosis of bilateral carotid arteries: Secondary | ICD-10-CM

## 2019-10-14 ENCOUNTER — Encounter: Payer: Self-pay | Admitting: Cardiovascular Disease

## 2019-10-14 ENCOUNTER — Other Ambulatory Visit: Payer: Self-pay

## 2019-10-14 ENCOUNTER — Ambulatory Visit (INDEPENDENT_AMBULATORY_CARE_PROVIDER_SITE_OTHER): Payer: Medicare Other | Admitting: Cardiovascular Disease

## 2019-10-14 VITALS — BP 160/94 | HR 133 | Temp 97.6°F | Ht 62.0 in | Wt 140.0 lb

## 2019-10-14 DIAGNOSIS — E039 Hypothyroidism, unspecified: Secondary | ICD-10-CM

## 2019-10-14 DIAGNOSIS — J439 Emphysema, unspecified: Secondary | ICD-10-CM

## 2019-10-14 DIAGNOSIS — E785 Hyperlipidemia, unspecified: Secondary | ICD-10-CM

## 2019-10-14 DIAGNOSIS — I6523 Occlusion and stenosis of bilateral carotid arteries: Secondary | ICD-10-CM

## 2019-10-14 DIAGNOSIS — I6521 Occlusion and stenosis of right carotid artery: Secondary | ICD-10-CM | POA: Diagnosis not present

## 2019-10-14 DIAGNOSIS — I10 Essential (primary) hypertension: Secondary | ICD-10-CM

## 2019-10-14 DIAGNOSIS — I25118 Atherosclerotic heart disease of native coronary artery with other forms of angina pectoris: Secondary | ICD-10-CM

## 2019-10-14 DIAGNOSIS — I4891 Unspecified atrial fibrillation: Secondary | ICD-10-CM

## 2019-10-14 DIAGNOSIS — I358 Other nonrheumatic aortic valve disorders: Secondary | ICD-10-CM

## 2019-10-14 DIAGNOSIS — R0602 Shortness of breath: Secondary | ICD-10-CM

## 2019-10-14 DIAGNOSIS — Z7901 Long term (current) use of anticoagulants: Secondary | ICD-10-CM

## 2019-10-14 MED ORDER — METOPROLOL SUCCINATE ER 50 MG PO TB24: 50.0000 mg | ORAL_TABLET | Freq: Every day | ORAL | 3 refills | Status: DC

## 2019-10-14 MED ORDER — DILTIAZEM HCL ER COATED BEADS 120 MG PO CP24: 120.0000 mg | ORAL_CAPSULE | Freq: Every day | ORAL | 1 refills | Status: DC

## 2019-10-14 NOTE — Progress Notes (Signed)
Cardiology Office Note    Date:  10/21/2019   ID:  AZALEA CEDAR, DOB [birth year], MRN [id number]  PCP:  Stacey Bowman, [facility] Cardiologist:  Stacey Majestic, MD     History of Present Illness:  Stacey Bowman is a 79 y.o. female who I initially saw for cardiology evaluation on 11/14/2016 through the courtesy of Stacey Bowman for cardiology evaluation.  I last saw her in October 2018.  She presents for a 39-monthfollow-up evaluation.  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  She developed right eye visual changes.  In early 2017 a CT of her neck demonstrated complex soft and calcified plaque at the bifurcation of the right internal carotid artery and bulb with stenosis of at least 60%.  There was plaque noted in her left carotid system, not felt to be significant.  She was also found to have calcified plaque at the left subclavian artery.  There was moderate to severe centrilobular emphysema.  She had been maintained on metoprolol 25 mg daily, clonidine 0.1 mg twice a day, and losartan 25 mg daily.  .  She noticed exertionally precipitated chest tightness and was given a prescription for sl nitroglycerin and has taken this on 2 occasions with benefit.  She also admits to exertional shortness of breath.  She has a remote history of Lyme disease, history of GERD.  When Initially saw her, she was hypertensive on her medical regimen and I added amlodipine 5 mg, which would be helpful both for blood pressure control as well as anti-ischemic benefit.  I recommended she reduce her aspirin to 81 mg.  A lexiscan [facility]ov[facility] was recommended and [facility] which showed normal perfusion without scar or ischemia. An echo Doppler study showed an EF of 65-70% with mildly impaired relaxation.  There was  aortic sclerosis with mild aortic regurgitation.  Tricuspid regurgitation.  [facility]  Renal function was normal.  Total cholesterol  154, LDL 82, triglycerides 159, VLDL 31.8 and HDL 42.  TSH was increased to 12.0.  Mild anemia with a hemoglobin of 11.4, hematocrit of 34.2.    When I saw her for follow-up evaluation in light of her possible ulcerated right ICA stenosis of 50 - 60% with a kinked internal carotid distal to this  I recommended the addition of Plavix to her baby aspirin for more optimal antiplatelet therapy. She underwent a follow-up carotid Doppler study 06/11/2017 which showed a right ICA stenosis of 60-79% in the left ICA of less than 40%.  Both vertebral arteries were patent with antegrade flow.  She has tolerated aspirin, Plavix.  She has been on Lipitor 10 mg for hyperlipidemia with target LDL less than 70.  She has been on amlodipine 5 mg for blood pressure control.  She was started on levothyroxine for hypothyroidism.  Since I last saw her in October 2018, she has been evaluated several times by Stacey Bowman.  She had canceled numerous appointments.  She was recently seen by Stacey Bowman July 03, 2019.  She had not had any TIA or amaurosis fugax.  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  Stacey. KGwenlyn Saranwas going to get a follow-up  CT.  Presently, she states that she has missed several appointments and never had her CT performed oftentimes due to not having transportation.  She continues to be on chronic 3 L of oxygen supplementation.  She denies recent chest pain.  She apparently is no longer taking atorvastatin or clonidine.  She is on Eliquis 5 mg twice a day.  She is no longer taking losartan.  She is on levothyroxine 137 mcg.  She has not had recent laboratory.  She presents for evaluation.  Past Medical History:  Diagnosis  Date  . ADD (attention deficit disorder)    takes Ritalin daily  . Anemia    iron deficiency  . Back pain    stenosis  . Chronic bronchitis    was on Dexamethasone and has been off for surgery  . DDD (degenerative disc disease)   . Depression    takes Prozac daily  . DJD (degenerative joint disease)   . Dysrhythmia    Rt BBB and Tacycardia  . Emphysema    uses Advair inhaler bid and Albuterol prn  . Emphysema   . 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 GERD (gastroesophageal reflux disease)    takes Omeprazole daily  . Headache(784.0)    migraines stopped with menopause  . History of blood transfusion   . History of colon polyps   . History of migraine   . History of staph infection 2013  . Joint pain   . Joint swelling   . Lyme disease 2013  . Muscle spasms of lower extremity    takes Flexeril daily as needed  . Neuropathy   . OCD (obsessive compulsive disorder)   . On home oxygen therapy   . Osteoporosis   . 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 Short-term memory loss   . Shortness of breath    with exertion  . Unspecified essential hypertension    takes Ramipril,Catapres,and Metoprolol daily  . Urinary frequency   . Urinary urgency   . UTI (lower urinary tract infection) hx of   was on a Sulfa med-placed on it 08-05-13 as precaution for surgery    Past Surgical History:  Procedure Laterality Date  . ABDOMINAL HYSTERECTOMY    . CHOLECYSTECTOMY    . COLONOSCOPY    . JOINT REPLACEMENT Left 08/19/13   hip  . Porta catheter Right 2011  . right arm surgery     fracure repair  . right arm surgery    . TOTAL ABDOMINAL HYSTERECTOMY W/ BILATERAL SALPINGOOPHORECTOMY    . TOTAL HIP ARTHROPLASTY Left 08/18/2013   Procedure: LEFT TOTAL HIP ARTHROPLASTY ANTERIOR APPROACH;  Surgeon: Mcarthur Rossetti, MD;  Location: [place];  Service: Orthopedics;  Laterality: Left;  . TOTAL HIP ARTHROPLASTY Right 10/06/2013   Stacey Bowman  .  TOTAL HIP ARTHROPLASTY Right 10/06/2013   Procedure: RIGHT TOTAL HIP ARTHROPLASTY ANTERIOR APPROACH;  Surgeon: Mcarthur Rossetti, MD;  Location: [place];  Service: Orthopedics;  Laterality: Right;  . WRIST SURGERY Right    ligament repair    Current Medications: Outpatient Medications Prior to Visit  Medication Sig Dispense Refill  . albuterol (PROVENTIL HFA;VENTOLIN HFA) 108 (90 BASE) MCG/ACT inhaler Inhale 2 puffs into the lungs as needed for wheezing or shortness of breath.    Marland Kitchen ammonium lactate (LAC-HYDRIN) 12 % lotion Apply 1 application topically 3 (three) times a week.    Marland Kitchen apixaban (ELIQUIS) 5 MG TABS tablet Take 5 mg by  mouth 2 (two) times daily.    Marland Kitchen aspirin 81 MG tablet Take 1 tablet (81 mg total) by mouth daily. 30 tablet   . benzonatate (TESSALON) 100 MG capsule Take 100 mg by mouth 2 (two) times daily as needed for cough.    Marland Kitchen CARAFATE 1 GM/10ML suspension Take 2 g by mouth daily.     . celecoxib (CELEBREX) 200 MG capsule Take 200 mg by mouth 2 (two) times daily.    . clotrimazole (MYCELEX) 10 MG troche Take 10 mg by mouth every 4 (four) hours as needed.    . cyclobenzaprine (FLEXERIL) 10 MG tablet Take 10 mg by mouth at bedtime as needed for muscle spasms. May also take 3 x a week if needed.    . diphenhydrAMINE (BENADRYL) 25 mg capsule Take 25 mg by mouth every 6 (six) hours as needed (cough).    . DULoxetine (CYMBALTA) 30 MG capsule Take 90 mg by mouth daily.    . fluticasone (FLONASE) 50 MCG/ACT nasal spray Place 1 spray into both nostrils daily as needed for allergies or rhinitis.    . folic acid (FOLVITE) 1 MG tablet Take 1 mg by mouth daily.    Marland Kitchen gabapentin (NEURONTIN) 300 MG capsule TAKE 900 MG AT BEDTIME AND 300 MG 1-2 TIMES IN THE DAYTIME.    Marland Kitchen guaifenesin (HUMIBID E) 400 MG TABS tablet Take 400 mg by mouth 2 (two) times daily as needed (cough).    Marland Kitchen guaiFENesin-codeine (ROBITUSSIN AC) 100-10 MG/5ML syrup Take 5 mLs by mouth 3 (three) times daily as needed for cough.     . levothyroxine (SYNTHROID) 137 MCG tablet Take 137 mcg by mouth daily before breakfast.    . methylphenidate (RITALIN) 10 MG tablet Take 10 mg by mouth 2 (two) times daily.    . montelukast (SINGULAIR) 10 MG tablet Take 10 mg by mouth at bedtime.     . mupirocin ointment (BACTROBAN) 2 % Apply 1 application topically daily as needed (sores on legs).    . nitroGLYCERIN (NITROSTAT) 0.4 MG SL tablet Place 0.4 mg under the tongue every 5 (five) minutes as needed for chest pain.    Marland Kitchen nystatin cream (MYCOSTATIN) Apply 1 application topically 3 (three) times daily as needed for dry skin.    Marland Kitchen omeprazole (PRILOSEC) 20 MG capsule Take 20 mg by mouth daily.    . prochlorperazine (COMPAZINE) 10 MG tablet Take 10 mg by mouth every 4 (four) hours as needed for nausea or vomiting.    . promethazine (PHENERGAN) 25 MG suppository Place 25 mg rectally every 6 (six) hours as needed for nausea or vomiting.    . Vitamin D, Ergocalciferol, (DRISDOL) 50000 UNITS CAPS capsule Take 50,000 Units by mouth every 7 (seven) days. Wednesday    . atorvastatin (LIPITOR) 20 MG tablet Take 20 mg by mouth daily.    . cloNIDine (CATAPRES) 0.1 MG tablet     . losartan (COZAAR) 25 MG tablet Take 1 tablet (25 mg total) by mouth daily. 90 tablet 3  . metoprolol succinate (TOPROL-XL) 25 MG 24 hr tablet Take 1 tablet (25 mg total) by mouth daily. Take with or immediately following a meal. 90 tablet 1  . metoprolol tartrate (LOPRESSOR) 25 MG tablet Take 1 tablet (25 mg total) by mouth once for 1 dose. Take with you to CTA. 4 tablet 0   Facility-Administered Medications Prior to Visit  Medication Dose Route Frequency Provider Last Rate Last Admin  . lidocaine (PF) (XYLOCAINE) 1 % injection  0.3 mL  0.3 mL Other Once Magnus Sinning, MD         Allergies:   Patient has no known allergies.   Social History   Socioeconomic History  . Marital status: Widowed    Spouse name: Not on file  . Number of children: 0  . Years of  education: Not on file  . Highest education level: Not on file  Occupational History  . Occupation: retired  Tobacco Use  . Smoking status: Former Smoker    Types: Cigarettes    Quit date: 09/04/1995    Years since quitting: 24.1  . Smokeless tobacco: Never Used  . Tobacco comment: quit in 1997  Substance and Sexual Activity  . Alcohol use: No    Comment: recovering alcoholic, quit in 1610  . Drug use: No  . Sexual activity: Not on file    Comment: quit 1997  Other Topics Concern  . Not on file  Social History Narrative  . Not on file   Social Determinants of Health   Financial Resource Strain: Low Risk   . Difficulty of Paying Living Expenses: Not very hard  Food Insecurity: No Food Insecurity  . Worried About Charity fundraiser in the Last Year: Never true  . Ran Out of Food in the Last Year: Never true  Transportation Needs: No Transportation Needs  . Lack of Transportation (Medical): No  . Lack of Transportation (Non-Medical): No  Physical Activity:   . Days of Exercise per Week: Not on file  . Minutes of Exercise per Session: Not on file  Stress:   . Feeling of Stress : Not on file  Social Connections:   . Frequency of Communication with Friends and Family: Not on file  . Frequency of Social Gatherings with Friends and Family: Not on file  . Attends Religious Services: Not on file  . Active Member of Clubs or Organizations: Not on file  . Attends Archivist Meetings: Not on file  . Marital Status: Not on file     Family History:   Family history includes Cervical cancer in her maternal aunt; Diabetes in her father and maternal grandmother; Emphysema in her mother; Heart disease in her maternal grandmother and mother; Lung disease in her brother.   ROS General: Negative; No fevers, chills, or night sweats;  HEENT: Negative; No changes in vision or hearing, sinus congestion, difficulty swallowing Pulmonary: Negative; No cough, wheezing, shortness of  breath, hemoptysis Cardiovascular: Negative; No chest pain, presyncope, syncope, palpitations GI: Negative; No nausea, vomiting, diarrhea, or abdominal pain GU: Negative; No dysuria, hematuria, or difficulty voiding Musculoskeletal: History of hip replacements in 2014 in 2015 Hematologic/Oncology: History of endometrial CA Endocrine: Positive for hypothyroidism on levothyroxine Neuro: Positive for ADD; Negative; no changes in balance, headaches Skin: Negative; No rashes or skin lesions Psychiatric: Negative; No behavioral problems, depression Sleep: Negative; No snoring, daytime sleepiness, hypersomnolence, bruxism, restless legs, hypnogognic hallucinations, no cataplexy Other comprehensive 14 point system review is negative.   PHYSICAL EXAM:   VS:  BP (!) 160/94   Pulse (!) 133   Temp 97.6 F (36.4 C)   Ht _0  (1.575 m)   Wt 140 lb (63.5 kg)   BMI 25.61 kg/m     Repeat blood pressure by me was 158/90  Wt Readings from Last 3 Encounters:  10/14/19 140 lb (63.5 kg)  07/03/19 140 lb (63.5 kg)  06/16/19 144 lb (65.3 kg)    General: Alert, oriented, no distress.  Skin: normal turgor, no rashes, warm and dry HEENT: Normocephalic, atraumatic. Pupils equal round and reactive to light; sclera anicteric; extraocular muscles intact;  Nose without nasal septal hypertrophy Mouth/Parynx benign; Mallinpatti scale 3 Neck: No JVD, no carotid bruits; normal carotid upstroke Lungs: No audible wheezing Chest wall: without tenderness to palpitation Heart: PMI not displaced, RRR, s1 s2 normal, 1/6 systolic murmur, no diastolic murmur, no rubs, gallops, thrills, or heaves Abdomen: soft, nontender; no hepatosplenomehaly, BS+; abdominal aorta nontender and not dilated by palpation. Back: no CVA tenderness Pulses 2+ Musculoskeletal: full range of motion, normal strength, no joint deformities Extremities: no clubbing cyanosis or edema, Homan's sign negative  Neurologic: grossly nonfocal; Cranial  nerves grossly wnl Psychologic: Normal mood and affect   Studies/Labs Reviewed:   ECG (independently read by me):Atrial fibrillation with RVR at 136; IRBBB,   April 08, 2019 ECG (independently read by me) when seen by Fabian Sharp, Bowman: Normal sinus rhythm at 85 bpm, right bundle branch block with repolarization changes.  June 18, 2017 ECG (independently read by me): Normal sinus rhythm at 68 bpm.  Right bundle-branch block with repolarization changes.  Mild T wave abnormality inferiorly.  12/14/2016 ECG (independently read by me): Normal sinus rhythm at 82 bpm.  Right bundle-branch block with repolarization changes.  QTc interval 467 ms.  11/14/2016 EKG:  EKG is ordered today.  ECG (independently read by me): Normal sinus rhythm at 67 bpm.  Right bundle-branch block with repolarization changes.  Wandering baseline.  Recent Labs: BMP Latest Ref Rng & Units 10/07/2013 09/30/2013 08/19/2013  Glucose 70 - 99 mg/dL 132(H) 110(H) 122(H)  BUN 6 - 23 mg/dL 19 21 25(H)  Creatinine 0.50 - 1.10 mg/dL 0.60 0.58 0.59  Sodium 137 - 147 mEq/L 140 141 130(L)  Potassium 3.7 - 5.3 mEq/L 3.9 4.1 3.6  Chloride 96 - 112 mEq/L 101 102 97  CO2 19 - 32 mEq/L _0 Calcium 8.4 - 10.5 mg/dL 7.8(L) 8.9 7.2(L)     Hepatic Function Latest Ref Rng & Units 02/06/2010  Total Protein 6.0 - 8.3 g/dL 6.9  Albumin 3.5 - 5.2 g/dL 4.3  AST 0 - 37 U/L 20  ALT 0 - 35 U/L 16  Alk Phosphatase 39 - 117 U/L 38(L)  Total Bilirubin 0.3 - 1.2 mg/dL 0.4    CBC Latest Ref Rng & Units 10/09/2013 10/08/2013 10/07/2013  WBC 4.0 - 10.5 K/uL 11.1(H) 15.8(H) 11.0(H)  Hemoglobin 12.0 - 15.0 g/dL 7.8(L) 8.6(L) 9.6(L)  Hematocrit 36.0 - 46.0 % 23.5(L) 26.7(L) 29.7(L)  Platelets 150 - 400 K/uL 200 230 206   Lab Results  Component Value Date   MCV 85.1 10/09/2013   MCV 85.9 10/08/2013   MCV 85.3 10/07/2013   No results found for: TSH No results found for: HGBA1C   BNP No results found for: BNP  ProBNP No results found for:  PROBNP   Lipid Panel  No results found for: CHOL, TRIG, HDL, CHOLHDL, VLDL, LDLCALC, LDLDIRECT   RADIOLOGY: No results found.   Additional studies/ records that were reviewed today include:  I have reviewed the records from [place], [place].  I reviewed previous carotid Doppler study as well as CT scan. I reviewed the multiple evaluations that she has had since my October 2018 evaluation.  ASSESSMENT:    1. Atherosclerosis of native coronary artery of native heart with stable angina pectoris ([place])   2. Symptomatic stenosis of right carotid artery   3. Atrial fibrillation, unspecified  type ([place])   4. Essential hypertension   5. Hyperlipidemia with target LDL less than 70   6. Shortness of breath   7. Pulmonary emphysema, unspecified emphysema type ([place])   8. Hypothyroidism, unspecified type   9. Aortic valve sclerosis   10. Anticoagulated     PLAN:  Ms. Floride Hutmacher is a 79 year old female who has a history of hypertension, peripheral neuropathy, GERD, attention deficit disorder, and has documented atherosclerosis involving her coronary arteries as well as aorta,carotid  and subclavian vessels.  She has a remote tobacco history but fortunately quit in 1997, but unfortunately has significant centrilobular emphysema and pulmonary fibrosis for which she is now on continuous supplemental oxygen therapy.  A remote echo revealed hyperdynamic LV function and evidence for aortic sclerosis.  Her nuclear perfusion study was low risk and showed normal perfusion without scar or ischemia.  I reviewed the records of Stacey. Kellie Simmering who she has seen in 2017.  At that time, she suffered a right retinal embolus which was felt perhaps to be due to an ulcerated right ICA stenosis approximating 50-60% with a kinked internal carotid distal to this.  She had not had any further symptoms since her retinal embolus that time, Stacey. Kellie Simmering recommended she undergo a right carotid  endarterectomy with resection of the redundant artery and removal of the kinked segment.  She ultimately decided against this.   She denies any recent neurologic or visual symptoms.  I reviewed her most carotid study of October 2018, which shows 60-79% right ICA stenoses with velocity slightly increased her prior study and less than 40% in the left carotid.  She has been scheduled for future studies but apparently never had these done oftentimes due to transportation issues and having to cancel appointments.  She is scheduled to undergo CT imaging but this has not yet been done.  Her blood pressure today is elevated and her ECG today reveals she is in atrial fibrillation of questionable duration.  Trickle rate is in the 130s.  She has been on metoprolol succinate 25 mg and I am recommending titration to 50 mg daily.  Also with her significant blood pressure elevation I am starting her on Cardizem CD 120 mg to take at night.  I am scheduling her to undergo laboratory and have recommended that this be done in [place] such that she will be able to drive herself to make certain that the laboratory is done which will include a comprehensive metabolic panel, magnesium, TSH, CBC and lipid studies.  I am scheduling her for an echo Doppler study.  She is on Eliquis for anticoagulation and denies recent bleeding.  She had been on atorvastatin but it does not appear that she has been taking this.  She has continued to be on levothyroxine for hypothyroidism.  I will contact her regarding laboratory once I get the sent to me from [place] and adjustments be made as needed.  I will work her into my office and see her in follow-up in 3 to 4 weeks for further evaluation.    Medication Adjustments/Labs and Tests Ordered: Current medicines are reviewed at length with the patient today.  Concerns regarding medicines are outlined above.  Medication changes, Labs and Tests ordered today are listed in the Patient Instructions  below.  Patient Instructions  Medication Instructions:  INCREASE METOPROLOL SUCCINATE TO 50MG DAILY IN THE MORNING (1 TABLET DAILY)  BEGIN TAKING CARDIZEM CD (DILTIAZEM) 120MG AT BEDTIME (1 TABLET AT BEDTIME)  *If you need a  refill on your cardiac medications before your next appointment, please call your pharmacy*  Lab Work: DONE AT [place]: CMET, MAG, TSH, CBC, LIPID If you have labs (blood work) drawn today and your tests are completely normal, you will receive your results only by: Marland Kitchen MyChart Message (if you have MyChart) OR . A paper copy in the mail If you have any lab test that is abnormal or we need to change your treatment, we will call you to review the results.  Follow-Up: At [facility], you and your health needs are our priority.  As part of our continuing mission to provide you with exceptional heart care, we have created designated Provider Care Teams.  These Care Teams include your primary Cardiologist (physician) and Advanced Practice Providers (APPs -  Physician Assistants and Nurse Practitioners) who all work together to provide you with the care you need, when you need it.  Your next appointment:   3-4 week(s)  The format for your next appointment:   In Person  Provider:   Shelva Majestic, MD      Signed, Stacey Majestic, MD  10/21/2019 2:32 PM    [facility] [address], [place], [place], [place]  [address] Phone: [phone]

## 2019-10-14 NOTE — Patient Instructions (Signed)
Medication Instructions:  INCREASE METOPROLOL SUCCINATE TO 50MG  DAILY IN THE MORNING (1 TABLET DAILY)  BEGIN TAKING CARDIZEM CD (DILTIAZEM) 120MG  AT BEDTIME (1 TABLET AT BEDTIME)  *If you need a refill on your cardiac medications before your next appointment, please call your pharmacy*  Lab Work: DONE AT [place]: CMET, MAG, TSH, CBC, LIPID If you have labs (blood work) drawn today and your tests are completely normal, you will receive your results only by: Marland Kitchen MyChart Message (if you have MyChart) OR . A paper copy in the mail If you have any lab test that is abnormal or we need to change your treatment, we will call you to review the results.  Follow-Up: At [facility], you and your health needs are our priority.  As part of our continuing mission to provide you with exceptional heart care, we have created designated Provider Care Teams.  These Care Teams include your primary Cardiologist (physician) and Advanced Practice Providers (APPs -  Physician Assistants and Nurse Practitioners) who all work together to provide you with the care you need, when you need it.  Your next appointment:   3-4 week(s)  The format for your next appointment:   In Person  Provider:   Shelva Majestic, MD

## 2019-10-21 ENCOUNTER — Encounter: Payer: Self-pay | Admitting: Cardiovascular Disease

## 2019-11-17 ENCOUNTER — Telehealth: Payer: Self-pay

## 2019-11-17 NOTE — Telephone Encounter (Signed)
Attempted to call pt on both home and cell numbers to reschedule an appt with Dr.Kelly. unable to leave a voicemail on either phone due to VMbox being full.

## 2019-11-18 NOTE — Telephone Encounter (Signed)
Attempted to reach pt at request of Doreene Adas, PA to reschedule appt to see Dr. Claiborne Billings. Was unable to reach pt and vm full. Sent pt a message through [facility] letting her know that her appt has been changed to see Dr. Claiborne Billings in the office on Wednesday, 3/24 at 10:20 AM. Asked pt to let us know if this date and time does not work for her.

## 2019-11-23 ENCOUNTER — Ambulatory Visit: Payer: Medicare Other | Admitting: Physician Assistant

## 2019-11-25 ENCOUNTER — Ambulatory Visit: Payer: Medicare Other | Admitting: Cardiovascular Disease

## 2019-12-14 ENCOUNTER — Other Ambulatory Visit: Payer: Self-pay | Admitting: *Deleted

## 2019-12-14 DIAGNOSIS — I6521 Occlusion and stenosis of right carotid artery: Secondary | ICD-10-CM

## 2019-12-14 DIAGNOSIS — R0602 Shortness of breath: Secondary | ICD-10-CM

## 2019-12-14 DIAGNOSIS — I10 Essential (primary) hypertension: Secondary | ICD-10-CM

## 2019-12-14 DIAGNOSIS — I25118 Atherosclerotic heart disease of native coronary artery with other forms of angina pectoris: Secondary | ICD-10-CM

## 2019-12-14 DIAGNOSIS — E785 Hyperlipidemia, unspecified: Secondary | ICD-10-CM

## 2019-12-14 MED ORDER — METOPROLOL SUCCINATE ER 50 MG PO TB24: 50.0000 mg | ORAL_TABLET | Freq: Every day | ORAL | 3 refills | Status: DC

## 2019-12-17 ENCOUNTER — Telehealth: Payer: Medicare Other | Admitting: Cardiovascular Disease

## 2019-12-18 ENCOUNTER — Other Ambulatory Visit: Payer: Self-pay

## 2019-12-22 ENCOUNTER — Encounter: Payer: Self-pay | Admitting: Cardiovascular Disease

## 2019-12-22 ENCOUNTER — Telehealth (INDEPENDENT_AMBULATORY_CARE_PROVIDER_SITE_OTHER): Payer: Medicare Other | Admitting: Cardiovascular Disease

## 2019-12-22 ENCOUNTER — Telehealth: Payer: Self-pay

## 2019-12-22 DIAGNOSIS — I10 Essential (primary) hypertension: Secondary | ICD-10-CM | POA: Diagnosis not present

## 2019-12-22 DIAGNOSIS — I25118 Atherosclerotic heart disease of native coronary artery with other forms of angina pectoris: Secondary | ICD-10-CM

## 2019-12-22 DIAGNOSIS — Z79899 Other long term (current) drug therapy: Secondary | ICD-10-CM

## 2019-12-22 DIAGNOSIS — I6521 Occlusion and stenosis of right carotid artery: Secondary | ICD-10-CM

## 2019-12-22 DIAGNOSIS — E785 Hyperlipidemia, unspecified: Secondary | ICD-10-CM | POA: Diagnosis not present

## 2019-12-22 DIAGNOSIS — R0602 Shortness of breath: Secondary | ICD-10-CM

## 2019-12-22 DIAGNOSIS — Z7901 Long term (current) use of anticoagulants: Secondary | ICD-10-CM

## 2019-12-22 DIAGNOSIS — E039 Hypothyroidism, unspecified: Secondary | ICD-10-CM

## 2019-12-22 DIAGNOSIS — I4891 Unspecified atrial fibrillation: Secondary | ICD-10-CM | POA: Diagnosis not present

## 2019-12-22 DIAGNOSIS — Z87891 Personal history of nicotine dependence: Secondary | ICD-10-CM

## 2019-12-22 MED ORDER — DILTIAZEM HCL ER COATED BEADS 240 MG PO CP24: 240.0000 mg | ORAL_CAPSULE | Freq: Every day | ORAL | 3 refills | Status: DC

## 2019-12-22 MED ORDER — DIGOXIN 125 MCG PO TABS: 0.1250 mg | ORAL_TABLET | Freq: Every day | ORAL | 3 refills | Status: DC

## 2019-12-22 NOTE — Patient Instructions (Addendum)
Medication Instructions:  INCREASE YOUR CARDIZEM TO 240MG  DAILY  BEGIN TAKING DIGOXIN (LANOXIN) 0.125MG  DAILY  *If you need a refill on your cardiac medications before your next appointment, please call your pharmacy*   Lab Work: IN A FEW WEEKS HAVE YOUR DIGOXIN LEVEL CHECKED WITH YOUR OTHER LABS  If you have labs (blood work) drawn today and your tests are completely normal, you will receive your results only by: Marland Kitchen MyChart Message (if you have MyChart) OR . A paper copy in the mail If you have any lab test that is abnormal or we need to change your treatment, we will call you to review the results.   Follow-Up: At [facility], you and your health needs are our priority.  As part of our continuing mission to provide you with exceptional heart care, we have created designated Provider Care Teams.  These Care Teams include your primary Cardiologist (physician) and Advanced Practice Providers (APPs -  Physician Assistants and Nurse Practitioners) who all work together to provide you with the care you need, when you need it.  We recommend signing up for the patient portal called "MyChart".  Sign up information is provided on this After Visit Summary.  MyChart is used to connect with patients for Virtual Visits (Telemedicine).  Patients are able to view lab/test results, encounter notes, upcoming appointments, etc.  Non-urgent messages can be sent to your provider as well.   To learn more about what you can do with MyChart, go to [email or web address].    Your next appointment:   3 month(s)  The format for your next appointment:   In Person  Provider:   Shelva Majestic, MD

## 2019-12-22 NOTE — Telephone Encounter (Signed)
Called and reviewed AVS from VV with Dr.Kelly today. Pt verbalized understanding with medication changes, labs, and follow up. No other questions at this time. Will mail avs and lab slips

## 2019-12-22 NOTE — Progress Notes (Signed)
Virtual Visit via Telephone Note   This visit type was conducted due to national recommendations for restrictions regarding the COVID-19 Pandemic (e.g. social distancing) in an effort to limit this patient's exposure and mitigate transmission in our community.  Due to her co-morbid illnesses, this patient is at least at moderate risk for complications without adequate follow up.  This format is felt to be most appropriate for this patient at this time.  The patient did not have access to video technology/had technical difficulties with video requiring transitioning to audio format only (telephone).  All issues noted in this document were discussed and addressed.  No physical exam could be performed with this format.  Please refer to the patient's chart for her  consent to telehealth for [facility]   The patient was identified using 2 identifiers.  Date:  12/29/2019   ID:  Stacey Bowman, DOB 1941/05/28, MRN [id number]  Patient Location: Home Provider Location: Office  PCP:  Stacey Flock, PA-C  Cardiologist:  Stacey Majestic, MD  Electrophysiologist:  None   Evaluation Performed:  Follow-Up Visit  Chief Complaint:  2 month F/U  History of Present Illness:    Stacey Bowman is a 79 y.o. female  who I initially saw for cardiology evaluation on 11/14/2016 through the courtesy of Stacey Bowman for cardiology evaluation.  I last saw her in February 2021 after not having seen her since October 2018.  She presents now for a 74-month evaluation in a telemedicine visit.   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  She developed right eye visual changes.  In early 2017 a CT of her neck demonstrated complex soft and calcified plaque at the bifurcation of the right internal carotid artery and bulb with  stenosis of at least 60%.  There was plaque noted in her left carotid system, not felt to be significant.  She was also found to have calcified plaque at the left subclavian artery.  There was moderate to severe centrilobular emphysema.  She had been maintained on metoprolol 25 mg daily, clonidine 0.1 mg twice a day, and losartan 25 mg daily.  .  She noticed exertionally precipitated chest tightness and was given a prescription for sl nitroglycerin and has taken this on 2 occasions with benefit.  She also admits to exertional shortness of breath.  She has a remote history of Lyme disease, history of GERD.  When Initially saw her, she was hypertensive on her medical regimen and I added amlodipine 5 mg, which would be helpful both for blood pressure control as well as anti-ischemic benefit.  I recommended she reduce her aspirin to 81 mg.  A lexiscan Myoview study was recommended and was done at [facility], which showed normal perfusion without scar or ischemia. An echo Doppler study showed an EF of 65-70% with mildly impaired relaxation.  There was aortic sclerosis with mild aortic regurgitation.  Tricuspid regurgitation.  She underwent laboratory at [facility].  Renal function was normal.  Total cholesterol  154, LDL 82, triglycerides 159, VLDL 31.8 and HDL 42.  TSH was increased to 12.0.  Mild anemia with a hemoglobin of 11.4, hematocrit of 34.2.    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 She underwent a follow-up carotid Doppler study 06/11/2017 which showed a right ICA stenosis of 60-79% in the left ICA of less than 40%.  Both vertebral arteries were patent with antegrade flow.  She has tolerated aspirin, Plavix.  She has been on Lipitor 10 mg for hyperlipidemia with target LDL less than 70.   She has been on amlodipine 5 mg for blood pressure control.  She was started on levothyroxine for hypothyroidism.  Since I  saw her in October 2018, she was evaluated several times by Stacey Sharp, PA.  She had canceled numerous appointments.  She was recently seen by Stacey Bowman in [place] at [facility] on July 03, 2019.  She had not had any TIA or amaurosis fugax.  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  Stacey Bowman was going to get a follow-up CT.  When I saw her in February 2021 after not having seen her since October 2018 she stated that she had missed several appointments that her CT performed oftentimes due to not having transportation. She continues to be on chronic 3 L of oxygen supplementation.  She denies recent chest pain.  She apparently is no longer taking atorvastatin or clonidine.  She is on Eliquis 5 mg twice a day.  She is no longer taking losartan.  She is on levothyroxine 137 mcg.  She has not had recent laboratory.    She was in atrial fibrillation of questionable duration with a ventricular rate at 130 bpm.  She was on metoprolol succinate 25 mg daily.During her evaluation, her blood pressure was elevated and I started Cardizem CD 120 mg to take nightly.  I recommended that she undergo an echo Doppler study.  She continues to be on levothyroxine for hypothyroidism.  Did not appear that she was taking her atorvastatin.  She presents today in a telemedicine visit.  She states that she has tolerated the Cardizem 120 mg and stopped taking the metoprolol in March since it made her feel "bad all the time."  She has noticed her heart rate typically running in the 100 210 range.  Losartan at 25 mg when her metoprolol was discontinued and apparently she is now on 50 mg.  She denies chest pain.  She has continued to be on Eliquis for anticoagulation and is without bleeding.  She denies chest pain.  She  presents for reevaluation.  The patient does not have symptoms concerning for COVID-19 infection (fever, chills, cough, or new shortness of breath).    Past Medical History:  Diagnosis Date  . ADD (attention deficit disorder)    takes Ritalin daily  . Anemia    iron deficiency  . Back pain    stenosis  . Chronic bronchitis    was on Dexamethasone and has been off for surgery  . DDD (degenerative disc disease)   . Depression    takes Prozac daily  . DJD (degenerative joint disease)   . Dysrhythmia    Rt BBB and Tacycardia  . Emphysema    uses Advair inhaler bid and Albuterol prn  . Emphysema   . 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 GERD (gastroesophageal reflux disease)    takes Omeprazole daily  . Headache(784.0)    migraines stopped with menopause  . History of blood transfusion   . History of colon  polyps   . History of migraine   . History of staph infection 2013  . Joint pain   . Joint swelling   . Lyme disease 2013  . Muscle spasms of lower extremity    takes Flexeril daily as needed  . Neuropathy   . OCD (obsessive compulsive disorder)   . On home oxygen therapy   . Osteoporosis   . 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 Short-term memory loss   . Shortness of breath    with exertion  . Unspecified essential hypertension    takes Ramipril,Catapres,and Metoprolol daily  . Urinary frequency   . Urinary urgency   . UTI (lower urinary tract infection) hx of   was on a Sulfa med-placed on it 08-05-13 as precaution for surgery   Past Surgical History:  Procedure Laterality Date  . ABDOMINAL HYSTERECTOMY    . CHOLECYSTECTOMY    . COLONOSCOPY    . JOINT REPLACEMENT Left 08/19/13   hip  . Porta catheter Right 2011  . right arm surgery     fracure repair  . right arm surgery    . TOTAL ABDOMINAL HYSTERECTOMY W/ BILATERAL SALPINGOOPHORECTOMY    . TOTAL HIP ARTHROPLASTY Left 08/18/2013   Procedure: LEFT TOTAL HIP  ARTHROPLASTY ANTERIOR APPROACH;  Surgeon: Mcarthur Rossetti, MD;  Location: [place];  Service: Orthopedics;  Laterality: Left;  . TOTAL HIP ARTHROPLASTY Right 10/06/2013   DR Ninfa Linden  . TOTAL HIP ARTHROPLASTY Right 10/06/2013   Procedure: RIGHT TOTAL HIP ARTHROPLASTY ANTERIOR APPROACH;  Surgeon: Mcarthur Rossetti, MD;  Location: [place];  Service: Orthopedics;  Laterality: Right;  . WRIST SURGERY Right    ligament repair     Current Meds  Medication Sig  . ammonium lactate (LAC-HYDRIN) 12 % lotion Apply 1 application topically 3 (three) times a week.  Marland Kitchen apixaban (ELIQUIS) 5 MG TABS tablet Take 5 mg by mouth 2 (two) times daily.  Marland Kitchen aspirin 81 MG tablet Take 1 tablet (81 mg total) by mouth daily.  Marland Kitchen atorvastatin (LIPITOR) 40 MG tablet Take 40 mg by mouth daily.  . benzonatate (TESSALON) 100 MG capsule Take 100 mg by mouth 2 (two) times daily as needed for cough.  Marland Kitchen CARAFATE 1 GM/10ML suspension Take 2 g by mouth daily.   . celecoxib (CELEBREX) 200 MG capsule Take 200 mg by mouth 2 (two) times daily.  . cloNIDine (CATAPRES) 0.1 MG tablet Take 0.1 mg by mouth as needed.   . clotrimazole (MYCELEX) 10 MG troche Take 10 mg by mouth every 4 (four) hours as needed.  . cyclobenzaprine (FLEXERIL) 10 MG tablet Take 10 mg by mouth at bedtime as needed for muscle spasms. May also take 3 x a week if needed.  . diltiazem (CARDIZEM CD) 240 MG 24 hr capsule Take 1 capsule (240 mg total) by mouth daily. At bedtime  . diphenhydrAMINE (BENADRYL) 25 mg capsule Take 25 mg by mouth every 6 (six) hours as needed (cough).  . DULoxetine (CYMBALTA) 30 MG capsule Take 90 mg by mouth daily.  . fexofenadine (ALLEGRA) 180 MG tablet Take 180 mg by mouth daily.  . fluticasone (FLONASE) 50 MCG/ACT nasal spray Place 1 spray into both nostrils daily as needed for allergies or rhinitis.  . folic acid (FOLVITE) 1 MG tablet Take 1 mg by mouth daily.  Marland Kitchen gabapentin (NEURONTIN) 300 MG capsule Take 600 mg by mouth 2 (two) times  daily. TAKE 900 MG AT  BEDTIME AND 300 MG 1-2 TIMES IN THE DAYTIME.  Marland Kitchen guaifenesin (HUMIBID E) 400 MG TABS tablet Take 400 mg by mouth 2 (two) times daily as needed (cough).  Marland Kitchen guaiFENesin-codeine (ROBITUSSIN AC) 100-10 MG/5ML syrup Take 5 mLs by mouth 3 (three) times daily as needed for cough.  . levothyroxine (SYNTHROID) 137 MCG tablet Take 137 mcg by mouth daily before breakfast.  . losartan (COZAAR) 50 MG tablet Take 50 mg by mouth daily.  . methylphenidate (RITALIN) 10 MG tablet Take 10 mg by mouth 2 (two) times daily.  . metoprolol succinate (TOPROL-XL) 50 MG 24 hr tablet Take 1 tablet (50 mg total) by mouth daily. Take with or immediately following a meal.  . montelukast (SINGULAIR) 10 MG tablet Take 10 mg by mouth at bedtime.   . mupirocin ointment (BACTROBAN) 2 % Apply 1 application topically daily as needed (sores on legs).  . nitroGLYCERIN (NITROSTAT) 0.4 MG SL tablet Place 0.4 mg under the tongue every 5 (five) minutes as needed for chest pain.  Marland Kitchen nystatin cream (MYCOSTATIN) Apply 1 application topically 3 (three) times daily as needed for dry skin.  Marland Kitchen omeprazole (PRILOSEC) 20 MG capsule Take 20 mg by mouth daily.  . prochlorperazine (COMPAZINE) 10 MG tablet Take 10 mg by mouth every 4 (four) hours as needed for nausea or vomiting.  . promethazine (PHENERGAN) 25 MG suppository Place 25 mg rectally every 6 (six) hours as needed for nausea or vomiting.  . Vitamin D, Ergocalciferol, (DRISDOL) 50000 UNITS CAPS capsule Take 50,000 Units by mouth every 7 (seven) days. Wednesday  . [DISCONTINUED] albuterol (PROVENTIL HFA;VENTOLIN HFA) 108 (90 BASE) MCG/ACT inhaler Inhale 2 puffs into the lungs as needed for wheezing or shortness of breath.  . [DISCONTINUED] diltiazem (CARDIZEM CD) 120 MG 24 hr capsule Take 1 capsule (120 mg total) by mouth daily. At bedtime   Current Facility-Administered Medications for the 12/22/19 encounter (Telemedicine) with Troy Sine, MD  Medication  . lidocaine  (PF) (XYLOCAINE) 1 % injection 0.3 mL     Allergies:   Patient has no known allergies.   Social History   Tobacco Use  . Smoking status: Former Smoker    Types: Cigarettes    Quit date: 09/04/1995    Years since quitting: 24.3  . Smokeless tobacco: Never Used  . Tobacco comment: quit in 1997  Substance Use Topics  . Alcohol use: No    Comment: recovering alcoholic, quit in 0000000  . Drug use: No     Family Hx: The patient's family history includes Cervical cancer in her maternal aunt; Diabetes in her father and maternal grandmother; Emphysema in her mother; Heart disease in her maternal grandmother and mother; Lung disease in her brother.  ROS:   Please see the history of present illness.    No fevers chills night sweats Heart rate typically in the 100-115 range No nausea vomiting or abdominal pain. [place] cancer History of hip replacements No edema Sleeping well All other systems reviewed and are negative.   Prior CV studies:   The following studies were reviewed today:  ECHO IMPRESSIONS  1. Left ventricular ejection fraction, by visual estimation, is 60 to  65%. The left ventricle has normal function. Normal left ventricular size.  There is no left ventricular hypertrophy. No regional wall motion  abnormalities.  2. Left ventricular diastolic Doppler parameters are indeterminate  pattern of LV diastolic filling.  3. Global right ventricle has normal systolic function.The right  ventricular size is normal. No  increase in right ventricular wall  thickness.  4. Left atrial size was moderately dilated.  5. Right atrial size was normal.  6. Mild mitral annular calcification.  7. The mitral valve is normal in structure. Trace mitral valve  regurgitation. No evidence of mitral stenosis.  8. The tricuspid valve is normal in structure. Tricuspid valve  regurgitation moderate.  9. The aortic valve is tricuspid Aortic valve regurgitation is mild by  color flow  Doppler. Structurally normal aortic valve, with no evidence of  sclerosis or stenosis.  10. The inferior vena cava is normal in size with greater than 50%  respiratory variability, suggesting right atrial pressure of 3 mmHg.  11. The tricuspid regurgitant velocity is 3.00 m/s, and with an assumed  right atrial pressure of 3 mmHg, the estimated right ventricular systolic  pressure is mildly elevated at 38.9 mmHg.  12. The patient appeared to be in atrial flutter.   Labs/Other Tests and Data Reviewed:    EKG: I personally reviewed her ECG from October 16, 2019 which showed atrial fibrillation with RVR at 136 bpm, incomplete right bundle branch block.  Recent Labs: No results found for requested labs within last 8760 hours.   Recent Lipid Panel No results found for: CHOL, TRIG, HDL, CHOLHDL, LDLCALC, LDLDIRECT  Wt Readings from Last 3 Encounters:  12/22/19 130 lb (59 kg)  10/14/19 140 lb (63.5 kg)  07/03/19 140 lb (63.5 kg)     Objective:    Vital Signs:  BP 135/90   Pulse (!) 115   Ht 5\' 2"  (1.575 m)   Wt 130 lb (59 kg)   SpO2 100%   BMI 23.78 kg/m    Since this was a telemedicine visit I could not physically examine the patient. Heart rate continued to be increased at 115 bpm. Blood pressure was mildly increased. Breathing was normal without audible wheezing She denied any chest pain She denied any abdominal bloating There was no significant swelling Was normal  ASSESSMENT & PLAN:    1. Atrial fibrillation: Ventricular rate continues to be elevated.  She apparently discontinued metoprolol and most recently has just been on Cardizem 120 mg.  I have recommended increasing Cardizem dose to 240 mg daily.  I am also adding Lanoxin 0.125 mg daily for additional rate control. 2. Anticoagulation: She is tolerating Eliquis without bleeding. 3. Essential hypertension: Blood pressure mildly increased today.  Will increase Cardizem CD to 240 mg.  Patient is now also on losartan 50  mg daily.  Continue therapy.  No signs of edema 4. Hyperlipidemia: Currently on atorvastatin 40 mg.  Tolerating well without myalgias 5. Hypothyroidism: Continues to be on levothyroxine.  COVID-19 Education: The signs and symptoms of COVID-19 were discussed with the patient and how to seek care for testing (follow up with PCP or arrange E-visit).  The importance of social distancing was discussed today.  Time:   Today, I have spent 20 minutes with the patient with telehealth technology discussing the above problems.     Medication Adjustments/Labs and Tests Ordered: Current medicines are reviewed at length with the patient today.  Concerns regarding medicines are outlined above.   Tests Ordered: Orders Placed This Encounter  Procedures  . Digoxin level    Medication Changes: Meds ordered this encounter  Medications  . diltiazem (CARDIZEM CD) 240 MG 24 hr capsule    Sig: Take 1 capsule (240 mg total) by mouth daily. At bedtime    Dispense:  90 capsule    Refill:  3  . digoxin (LANOXIN) 0.125 MG tablet    Sig: Take 1 tablet (0.125 mg total) by mouth daily.    Dispense:  90 tablet    Refill:  3    Follow Up: Setting in 3 months.  Recommend follow-up chemistry, dig level lipid studies if not recently done  Signed, Stacey Majestic, MD  12/29/2019 6:06 PM    [place]

## 2019-12-23 ENCOUNTER — Telehealth: Payer: Self-pay

## 2019-12-23 NOTE — Telephone Encounter (Signed)
[facility] calling stating that pt was prescribed Digoxin and pt is already taking diltiazem 240 mg tablet and pharmacy is stating that there is a interaction between these two medication and would like a call back to clarify if pt should be taking both of these medications. Ph# [phone]. Please address

## 2019-12-24 NOTE — Telephone Encounter (Signed)
Please let pharmacy know we are aware of the interaction and are monitoring patient accordingly - please fill both medications

## 2019-12-24 NOTE — Telephone Encounter (Signed)
Routed to MD/[facility] to review

## 2019-12-25 NOTE — Telephone Encounter (Signed)
[facility] of Stacey [facility] med review/recommendations

## 2019-12-29 ENCOUNTER — Encounter: Payer: Self-pay | Admitting: Cardiovascular Disease

## 2020-02-04 ENCOUNTER — Telehealth: Payer: Self-pay | Admitting: Cardiovascular Disease

## 2020-02-04 NOTE — Telephone Encounter (Signed)
Called patient- advised that we would have to have her seen- and do her blood work, I was unsure if they could increase this medication any further- patient will wait for her appointment in July to have the blood work done before and will wait to see what they suggest at that appointment.

## 2020-02-04 NOTE — Telephone Encounter (Signed)
Pt c/o medication issue:  1. Name of Medication: digoxin (LANOXIN) 0.125 MG tablet  2. How are you currently taking this medication (dosage and times per day)? 1 tablet a day  3. Are you having a reaction (difficulty breathing--STAT)? no  4. What is your medication issue?  Patient states the medication is not doing what it's supposed to do and would like to know if she can increase the dose.

## 2020-02-29 ENCOUNTER — Other Ambulatory Visit: Payer: Self-pay | Admitting: Cardiovascular Disease

## 2020-02-29 DIAGNOSIS — I6521 Occlusion and stenosis of right carotid artery: Secondary | ICD-10-CM

## 2020-02-29 DIAGNOSIS — I25118 Atherosclerotic heart disease of native coronary artery with other forms of angina pectoris: Secondary | ICD-10-CM

## 2020-02-29 DIAGNOSIS — R0602 Shortness of breath: Secondary | ICD-10-CM

## 2020-02-29 DIAGNOSIS — I10 Essential (primary) hypertension: Secondary | ICD-10-CM

## 2020-02-29 DIAGNOSIS — E785 Hyperlipidemia, unspecified: Secondary | ICD-10-CM

## 2020-02-29 NOTE — Telephone Encounter (Signed)
 *  STAT* If patient is at the pharmacy, call can be transferred to refill team.   1. Which medications need to be refilled? (please list name of each medication and dose if known) diltiazem (CARDIZEM CD) 120 MG  2. Which pharmacy/location (including street and city if local pharmacy) is medication to be sent to? [id number] [place], [place] - [address]  3. Do they need a 30 day or 90 day supply? 90 day  Patient is stating that she wants to go back to the 120 mg because the 240 mg was too much.

## 2020-03-01 NOTE — Telephone Encounter (Signed)
If she cannot tolerate Cardizem 240 mg would reduce to 180 mg rather than going to 120 mg when she was tachycardic on that dose

## 2020-03-03 ENCOUNTER — Other Ambulatory Visit: Payer: Self-pay

## 2020-03-03 ENCOUNTER — Ambulatory Visit: Payer: Medicare Other | Admitting: Physician Assistant

## 2020-03-03 NOTE — Telephone Encounter (Signed)
[id number] on cell phone, attempted home phone but mailbox full and unable to leave message. 7/1

## 2020-03-04 MED ORDER — DILTIAZEM HCL ER 120 MG PO CP12: 120.0000 mg | ORAL_CAPSULE | Freq: Two times a day (BID) | ORAL | 3 refills | Status: DC

## 2020-03-04 NOTE — Telephone Encounter (Signed)
Cardizem CD is formulated to be released over 24 hours.  Is there a reason she can not take once daily?  They do make a diltiazem 120mg  12 hour formulation

## 2020-03-04 NOTE — Telephone Encounter (Signed)
Pt state she isn't requesting to reduce the dose, she is wanting to know if she can take Cardizem 120 mg twice a day to equal 240 daily.   Will route to MD and Pharm D

## 2020-03-04 NOTE — Telephone Encounter (Signed)
Pt report she would prefer to take medication twice a day as she feel she get too much at one time and her blood pressure increases in the evening. Pharmacist Cyril Mourning made aware and wanted pt to be aware that medication is ER so she doesn't get the full dose at one time. Pt voiced she would still prefer to take medication twice a day.. Per Pharm D, send it 120 mg 12 hr dose. New Rx sent to pharmacy.

## 2020-03-04 NOTE — Telephone Encounter (Signed)
Follow Up  Patient is returning call about medication. Transferred to triage.

## 2020-03-11 ENCOUNTER — Telehealth: Payer: Self-pay | Admitting: Cardiovascular Disease

## 2020-03-11 NOTE — Telephone Encounter (Signed)
New Message  Patient states that she received a call Monday or Tuesday and she is calling back. I do not see any notes. I do see where back on the 2nd of July patient was calling about medication and dosage. Please call to discuss.

## 2020-03-11 NOTE — Telephone Encounter (Signed)
Pt was needing Diltiazem script and was confused why she has not been able to pick up  [facility] Called pt back and let her know she may pick it  up today Pt was very thankful for return call and will get med later today ./cy

## 2020-04-26 ENCOUNTER — Ambulatory Visit: Payer: Medicare Other | Admitting: Gastroenterology

## 2020-04-29 ENCOUNTER — Other Ambulatory Visit: Payer: Self-pay

## 2020-05-04 ENCOUNTER — Ambulatory Visit: Payer: Medicare Other | Admitting: Physician Assistant

## 2020-05-04 NOTE — Progress Notes (Deleted)
Cardiology Office Note:    Date:  05/04/2020   ID:  Stacey Bowman, DOB 06-Sep-1940, MRN [id number]  PCP:  Dustin Flock, PA-C  Cardiologist:  Shelva Majestic, MD   Referring MD: Dustin Flock, PA-C   No chief complaint on file. ***  History of Present Illness:    Stacey Bowman is a 79 y.o. 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 At the time of her retinal embolus, Dr. Kellie Simmering recommended right endarterectomy with resection of a redundant artery and removal of kinked segment. She refused this intervention and wanted a second opinion (never followed up). She established cardiology care with Dr. Claiborne Billings and was last seen 06/18/17. At that time, she had some exertional chest tightness relieved with nitro. DOE also reported. Myoview at [facility] was negative for scar or reversible ischemia. Echocardiogram showed EF of 65-70% with mild DD, aortic sclerosis with mild AR, TR.  She had canceled her last 6 cardiology appts. I was able to connect with her through a virtual visit on 03/12/19. I D/C'ed her PRN clonidine and increased her norvasc to 10 mg. I also ordered repeat carotid dopplers. I was very concerned about ongoing TIAs and strongly recommend VVS and neurology follow-ups - we sent referrals for both. Repeat carotid dopplers 03/31/19 with progressive right carotid plaque - recommended PV evaluation by Dr. Gwenlyn Found. Case was reviewed with Dr. Claiborne Billings. Pt did not want a heart catheterization at that time, but agreed to CT coronary.  It appears that was scheduled twice, but she canceled. I D/C'ed amlodipine and started low dose imdur. She has since followed with Dr. Claiborne Billings, last visit was 12/22/19. She had not completed her CT due to transportation issues. She was in Afib of unknown duration, on eliquis  and lopressor. Rates and BP elevated and cardizem 120 mg started. She stopped lipitor. Dr. Claiborne Billings recommended echo. He saw her in a virtual visit 12/22/19 and she was taking cardizem and had stopped lopressor. She has started 50 mg losartan.  Dr. Claiborne Billings started digoxin.   She presents back today for evaluation. She is taking the following:       She denied any further TIA or amaurosis fugax.   She presents today for follow up.   ?was she sent to Dr. Gwenlyn Found?   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   GERD (gastroesophageal reflux disease)    takes Omeprazole daily   Headache(784.0)    migraines stopped with menopause   History of blood transfusion    History of colon polyps    History of migraine    History of staph  infection 2013   Joint pain    Joint swelling    Lyme disease 2013   Muscle spasms of lower extremity    takes Flexeril daily as needed   Neuropathy    OCD (obsessive compulsive disorder)    On home oxygen therapy    Osteoporosis    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[place])    was taking steroids-came off in May 2014   Short-term memory loss    Shortness of breath    with exertion   Unspecified essential hypertension    takes Ramipril,Catapres,and Metoprolol daily   Urinary frequency    Urinary urgency    UTI (lower urinary tract infection) hx of   was on a Sulfa med-placed on it 08-05-13 as precaution  for surgery    Past Surgical History:  Procedure Laterality Date   ABDOMINAL HYSTERECTOMY     CHOLECYSTECTOMY     COLONOSCOPY     JOINT REPLACEMENT Left 08/19/13   hip   Porta catheter Right 2011   right arm surgery     fracure repair   right arm surgery     TOTAL ABDOMINAL HYSTERECTOMY W/ BILATERAL SALPINGOOPHORECTOMY     TOTAL HIP ARTHROPLASTY Left 08/18/2013   Procedure: LEFT TOTAL HIP ARTHROPLASTY ANTERIOR APPROACH;  Surgeon: Mcarthur Rossetti, MD;  Location: [place];  Service: Orthopedics;  Laterality: Left;   TOTAL HIP ARTHROPLASTY Right 10/06/2013   DR Ninfa Linden   TOTAL HIP ARTHROPLASTY Right 10/06/2013   Procedure: RIGHT TOTAL HIP ARTHROPLASTY ANTERIOR APPROACH;  Surgeon: Mcarthur Rossetti, MD;  Location: [place];  Service: Orthopedics;  Laterality: Right;   WRIST SURGERY Right    ligament repair    Current Medications: No outpatient medications have been marked as taking for the 05/04/20 encounter (Appointment) with Ledora Bottcher, [place].   Current Facility-Administered Medications for the 05/04/20 encounter (Appointment) with Ledora Bottcher, PA  Medication   lidocaine (PF) (XYLOCAINE) 1 % injection 0.3 mL     Allergies:   Patient has no known allergies.   Social History   Socioeconomic History   Marital status: Widowed    Spouse name: Not on file   Number of children: 0   Years of education: Not on file   Highest education level: Not on file  Occupational History   Occupation: retired  Tobacco Use   Smoking status: Former Smoker    Types: Cigarettes    Quit date: 09/04/1995    Years since quitting: 24.6   Smokeless tobacco: Never Used   Tobacco comment: quit in 1997  Substance and Sexual Activity   Alcohol use: No    Comment: recovering alcoholic, quit in 1610   Drug use: No   Sexual activity: Not on file    Comment: quit 1997  Other Topics Concern   Not on file  Social History Narrative   Not on file   Social  Determinants of Health   Financial Resource Strain:    Difficulty of Paying Living Expenses: Not on file  Food Insecurity:    Worried About [place] in the Last Year: Not on file   [place] in the Last Year: Not on file  Transportation Needs:    Lack of Transportation (Medical): Not on file   Lack of Transportation (Non-Medical): Not on file  Physical Activity:    Days of Exercise per Week: Not on file   Minutes of Exercise per Session: Not on file  Stress:    Feeling of  Stress : Not on file  Social Connections:    Frequency of Communication with Friends and Family: Not on file   Frequency of Social Gatherings with Friends and Family: Not on file   Attends Religious Services: Not on file   Active Member of Clubs or Organizations: Not on file   Attends Archivist Meetings: Not on file   Marital Status: Not on file     Family History: The patient's ***family history includes Cervical cancer in her maternal aunt; Diabetes in her father and maternal grandmother; Emphysema in her mother; Heart disease in her maternal grandmother and mother; Lung disease in her brother.  ROS:   Please see the history of present illness.    *** All other systems reviewed and are negative.  EKGs/Labs/Other Studies Reviewed:    The following studies were reviewed today: ***  EKG:  EKG is *** ordered today.  The ekg ordered today demonstrates ***  Recent Labs: No results found for requested labs within last 8760 hours.  Recent Lipid Panel No results found for: CHOL, TRIG, HDL, CHOLHDL, VLDL, LDLCALC, LDLDIRECT  Physical Exam:    VS:  There were no vitals taken for this visit.    Wt Readings from Last 3 Encounters:  12/22/19 130 lb (59 kg)  10/14/19 140 lb (63.5 kg)  07/03/19 140 lb (63.5 kg)     GEN: *** Well nourished, well developed in no acute distress HEENT: Normal NECK: No JVD; No carotid bruits LYMPHATICS: No lymphadenopathy CARDIAC:  ***RRR, no murmurs, rubs, gallops RESPIRATORY:  Clear to auscultation without rales, wheezing or rhonchi  ABDOMEN: Soft, non-tender, non-distended MUSCULOSKELETAL:  No edema; No deformity  SKIN: Warm and dry NEUROLOGIC:  Alert and oriented x 3 PSYCHIATRIC:  Normal affect   ASSESSMENT:    No diagnosis found. PLAN:    In order of problems listed above:  No diagnosis found.   Medication Adjustments/Labs and Tests Ordered: Current medicines are reviewed at length with the patient today.  Concerns regarding medicines are outlined above.  No orders of the defined types were placed in this encounter.  No orders of the defined types were placed in this encounter.   Signed, Ledora Bottcher, PA  05/04/2020 11:13 AM    [place]

## 2020-05-12 ENCOUNTER — Ambulatory Visit (INDEPENDENT_AMBULATORY_CARE_PROVIDER_SITE_OTHER): Payer: Medicare Other

## 2020-05-12 ENCOUNTER — Encounter: Payer: Self-pay | Admitting: Orthopaedic Surgery

## 2020-05-12 ENCOUNTER — Other Ambulatory Visit: Payer: Self-pay

## 2020-05-12 ENCOUNTER — Ambulatory Visit (INDEPENDENT_AMBULATORY_CARE_PROVIDER_SITE_OTHER): Payer: Medicare Other | Admitting: Orthopaedic Surgery

## 2020-05-12 DIAGNOSIS — Z96643 Presence of artificial hip joint, bilateral: Secondary | ICD-10-CM

## 2020-05-12 DIAGNOSIS — M79651 Pain in right thigh: Secondary | ICD-10-CM | POA: Diagnosis not present

## 2020-05-12 DIAGNOSIS — M79652 Pain in left thigh: Secondary | ICD-10-CM

## 2020-05-12 DIAGNOSIS — M48061 Spinal stenosis, lumbar region without neurogenic claudication: Secondary | ICD-10-CM | POA: Diagnosis not present

## 2020-05-12 DIAGNOSIS — I6523 Occlusion and stenosis of bilateral carotid arteries: Secondary | ICD-10-CM

## 2020-05-12 NOTE — Progress Notes (Signed)
Office Visit Note   Patient: Stacey Bowman           Date of Birth: 14-Dec-1940           MRN: [id number] Visit Date: 05/12/2020              Requested by: Dustin Flock, PA-C [address] [place],  [place] [address] PCP: Dustin Flock, [place]   Assessment & Plan: Visit Diagnoses:  1. H/O bilateral hip replacements   2. Bilateral thigh pain   3. Spinal stenosis of lumbar region without neurogenic claudication     Plan: I believe her thigh pain is related mainly to lumbar stenosis.  She would benefit from outpatient physical therapy for strengthening of her core muscles as well as her back and her thighs.  Any modalities to help decrease her pain would be worthwhile trying.  She would like this to be done at [facility] with her outpatient therapy.  We will see if we can get that set up.  I would then see her back myself in about 6 weeks to see how she is doing overall.  All questions and concerns were answered and addressed.  Follow-Up Instructions: Return in about 6 weeks (around 06/23/2020).   Orders:  Orders Placed This Encounter  Procedures  . XR Pelvis 1-2 Views   No orders of the defined types were placed in this encounter.     Procedures: No procedures performed   Clinical Data: No additional findings.   Subjective: Chief Complaint  Patient presents with  . Left Hip - Pain  . Right Hip - Pain  The patient is listed as a new patient because we have not seen her in 6 years.  I have actually replaced both of her hips with the left hip in 2014 in the right hip in 2015.  She is now on chronic oxygen therapy.  She reports bilateral thigh pain and significant low back pain.  She has had no other acute change in medical status but has significantly slowed down in life due to her lack of mobility in general.  HPI  Review of Systems She does have chronic shortness of breath and she is on chronic O2 therapy.  She denies any chest pain, fever, chills, nausea,  vomiting  Objective: Vital Signs: There were no vitals taken for this visit.  Physical Exam She is alert and orient x3 and in no acute distress Ortho Exam examination of both hips shows full and fluid motion of both hips.  She has excellent strength in both her thighs and knees as well as feet and ankle on both sides.  She has a very frail stature and when she stands she is hard to stand upright.  There is definitely a scoliosis about her lumbar spine and pain in the lower aspect of the lumbar spine. Specialty Comments:  No specialty comments available.  Imaging: XR Pelvis 1-2 Views  Result Date: 05/12/2020 A low AP pelvis shows bilateral total hip arthroplasties with no complicating features.  There is no evidence of loosening.  The lower aspect of the lumbar spine shows severe degenerative scoliosis with significant disc space narrowing at multiple levels.    PMFS History: Patient Active Problem List   Diagnosis Date Noted  . Carotid artery stenosis, symptomatic 11/15/2015  . Arthritis of right hip 10/06/2013  . Degenerative arthritis of left hip 08/18/2013  . 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  . Iron deficiency anemia 07/15/2013  .  Endometrial ca [facility]) 07/15/2013   Past Medical History:  Diagnosis Date  . ADD (attention deficit disorder)    takes Ritalin daily  . Anemia    iron deficiency  . Back pain    stenosis  . Chronic bronchitis    was on Dexamethasone and has been off for surgery  . DDD (degenerative disc disease)   . Depression    takes Prozac daily  . DJD (degenerative joint disease)   . Dysrhythmia    Rt BBB and Tacycardia  . Emphysema    uses Advair inhaler bid and Albuterol prn  . Emphysema   . 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 GERD (gastroesophageal reflux disease)    takes Omeprazole daily  . Headache(784.0)    migraines stopped with menopause  . History of blood transfusion   . History of colon polyps   . History of  migraine   . History of staph infection 2013  . Joint pain   . Joint swelling   . Lyme disease 2013  . Muscle spasms of lower extremity    takes Flexeril daily as needed  . Neuropathy   . OCD (obsessive compulsive disorder)   . On home oxygen therapy   . Osteoporosis   . 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 Short-term memory loss   . Shortness of breath    with exertion  . Unspecified essential hypertension    takes Ramipril,Catapres,and Metoprolol daily  . Urinary frequency   . Urinary urgency   . UTI (lower urinary tract infection) hx of   was on a Sulfa med-placed on it 08-05-13 as precaution for surgery    Family History  Problem Relation Age of Onset  . Cervical cancer Maternal Aunt   . Emphysema Mother   . Heart disease Mother   . Diabetes Father   . Diabetes Maternal Grandmother   . Heart disease Maternal Grandmother   . Lung disease Brother     Past Surgical History:  Procedure Laterality Date  . ABDOMINAL HYSTERECTOMY    . CHOLECYSTECTOMY    . COLONOSCOPY    . JOINT REPLACEMENT Left 08/19/13   hip  . Porta catheter Right 2011  . right arm surgery     fracure repair  . right arm surgery    . TOTAL ABDOMINAL HYSTERECTOMY W/ BILATERAL SALPINGOOPHORECTOMY    . TOTAL HIP ARTHROPLASTY Left 08/18/2013   Procedure: LEFT TOTAL HIP ARTHROPLASTY ANTERIOR APPROACH;  Surgeon: Mcarthur Rossetti, MD;  Location: [place];  Service: Orthopedics;  Laterality: Left;  . TOTAL HIP ARTHROPLASTY Right 10/06/2013   DR Ninfa Linden  . TOTAL HIP ARTHROPLASTY Right 10/06/2013   Procedure: RIGHT TOTAL HIP ARTHROPLASTY ANTERIOR APPROACH;  Surgeon: Mcarthur Rossetti, MD;  Location: [place];  Service: Orthopedics;  Laterality: Right;  . WRIST SURGERY Right    ligament repair   Social History   Occupational History  . Occupation: retired  Tobacco Use  . Smoking status: Former Smoker    Types: Cigarettes    Quit date: 09/04/1995    Years since  quitting: 24.7  . Smokeless tobacco: Never Used  . Tobacco comment: quit in 1997  Substance and Sexual Activity  . Alcohol use: No    Comment: recovering alcoholic, quit in 1914  . Drug use: No  . Sexual activity: Not on file    Comment: quit 1997

## 2020-05-20 ENCOUNTER — Telehealth: Payer: Self-pay | Admitting: Physical Medicine and Rehabilitation

## 2020-05-20 NOTE — Telephone Encounter (Signed)
Patient called. She would like an appointment with Dr. Newton.  

## 2020-05-23 ENCOUNTER — Telehealth: Payer: Self-pay | Admitting: Physical Medicine and Rehabilitation

## 2020-05-23 NOTE — Telephone Encounter (Signed)
Patient called.   She is requesting a call back to schedule an appointment to be seen for her back.   Cal back: [phone]

## 2020-05-23 NOTE — Telephone Encounter (Signed)
Scheduled for OV. 

## 2020-05-24 NOTE — Telephone Encounter (Signed)
Called pt and ivm #1

## 2020-06-08 ENCOUNTER — Ambulatory Visit: Payer: Medicare Other | Admitting: Gastroenterology

## 2020-06-14 ENCOUNTER — Ambulatory Visit: Payer: Medicare Other | Admitting: Physical Medicine and Rehabilitation

## 2020-06-23 ENCOUNTER — Ambulatory Visit: Payer: Medicare Other | Admitting: Orthopaedic Surgery

## 2020-06-24 ENCOUNTER — Ambulatory Visit: Payer: Medicare Other | Admitting: Gastroenterology

## 2020-08-01 ENCOUNTER — Other Ambulatory Visit: Payer: Self-pay

## 2020-08-01 MED ORDER — LOSARTAN POTASSIUM 50 MG PO TABS: 50.0000 mg | ORAL_TABLET | Freq: Every day | ORAL | 3 refills | Status: DC

## 2020-08-29 ENCOUNTER — Other Ambulatory Visit: Payer: Self-pay

## 2020-08-29 NOTE — Telephone Encounter (Signed)
Hi Ebony,  Like you, I only see documentation of the 50 mg dose for her in her recent visits. We will likely have to reach out to the pharmacy directly to see what's causing this discrepancy.  Thank you for catching it,  Judson Roch

## 2020-08-31 NOTE — Telephone Encounter (Signed)
No problem.

## 2020-09-21 ENCOUNTER — Other Ambulatory Visit: Payer: Self-pay

## 2020-09-21 MED ORDER — LOSARTAN POTASSIUM 50 MG PO TABS: 50.0000 mg | ORAL_TABLET | Freq: Every day | ORAL | 0 refills | Status: DC

## 2020-09-21 MED ORDER — DIGOXIN 125 MCG PO TABS: 0.1250 mg | ORAL_TABLET | Freq: Every day | ORAL | 0 refills | Status: DC

## 2020-09-22 ENCOUNTER — Other Ambulatory Visit: Payer: Self-pay

## 2020-09-22 MED ORDER — DIGOXIN 125 MCG PO TABS: 0.1250 mg | ORAL_TABLET | Freq: Every day | ORAL | 0 refills | Status: DC

## 2020-10-14 ENCOUNTER — Ambulatory Visit: Payer: Medicare Other | Admitting: Vascular Surgery

## 2020-10-21 ENCOUNTER — Other Ambulatory Visit: Payer: Self-pay

## 2020-10-21 DIAGNOSIS — I6523 Occlusion and stenosis of bilateral carotid arteries: Secondary | ICD-10-CM

## 2020-11-16 ENCOUNTER — Other Ambulatory Visit: Payer: Medicare Other

## 2020-11-16 ENCOUNTER — Inpatient Hospital Stay: Admission: RE | Admit: 2020-11-16 | Payer: Medicare Other | Source: Ambulatory Visit

## 2020-11-25 ENCOUNTER — Ambulatory Visit: Payer: Medicare Other | Admitting: Vascular Surgery

## 2020-11-25 ENCOUNTER — Other Ambulatory Visit: Payer: Self-pay

## 2020-11-25 DIAGNOSIS — I6523 Occlusion and stenosis of bilateral carotid arteries: Secondary | ICD-10-CM

## 2020-11-28 ENCOUNTER — Other Ambulatory Visit: Payer: Self-pay | Admitting: Cardiovascular Disease

## 2020-12-02 ENCOUNTER — Other Ambulatory Visit: Payer: Self-pay | Admitting: Cardiovascular Disease

## 2020-12-13 ENCOUNTER — Other Ambulatory Visit: Payer: Medicare Other

## 2020-12-26 ENCOUNTER — Ambulatory Visit
Admission: RE | Admit: 2020-12-26 | Discharge: 2020-12-26 | Disposition: A | Discharge status: Home / Self Care | Payer: Medicare Other | Source: Ambulatory Visit | Attending: Vascular Surgery | Admitting: Vascular Surgery

## 2020-12-26 ENCOUNTER — Other Ambulatory Visit: Payer: Self-pay

## 2020-12-26 DIAGNOSIS — I6523 Occlusion and stenosis of bilateral carotid arteries: Secondary | ICD-10-CM

## 2020-12-26 MED ORDER — IOPAMIDOL (ISOVUE-370) INJECTION 76%
75.0000 mL | Freq: Once | INTRAVENOUS | Status: AC | PRN
  Administered 2020-12-26: 75 mL via INTRAVENOUS

## 2021-01-13 ENCOUNTER — Ambulatory Visit: Payer: Medicare Other | Admitting: Vascular Surgery

## 2021-01-17 ENCOUNTER — Other Ambulatory Visit: Payer: Self-pay

## 2021-01-17 ENCOUNTER — Ambulatory Visit (INDEPENDENT_AMBULATORY_CARE_PROVIDER_SITE_OTHER): Payer: Medicare Other | Admitting: Vascular Surgery

## 2021-01-17 ENCOUNTER — Encounter: Payer: Self-pay | Admitting: Vascular Surgery

## 2021-01-17 VITALS — BP 112/56 | HR 95 | Temp 98.2°F | Resp 20 | Ht 62.0 in | Wt 130.0 lb

## 2021-01-17 DIAGNOSIS — I6523 Occlusion and stenosis of bilateral carotid arteries: Secondary | ICD-10-CM

## 2021-01-17 NOTE — Progress Notes (Signed)
Patient ID: Stacey Bowman, female   DOB: 12/02/1940, 80 y.o.   MRN: [id number]  Reason for Consult: Follow-up   Referred by Dustin Flock, PA-C  Subjective:     HPI:  Stacey Bowman is a 80 y.o. female previous symptomatic right ICA stenosis.  She had been scheduled for carotid endarterectomy but this was canceled.  She now follows up with CT scan.  She does have some visual disturbances but she states that this is narrowing of her vision on the right.  She has no new stroke, TIA or amaurosis.  She is on 3 L nasal cannula this is stable.  She is on anticoagulation for previous DVT in her right lower extremity.  Past Medical History:  Diagnosis Date  . ADD (attention deficit disorder)    takes Ritalin daily  . Anemia    iron deficiency  . Back pain    stenosis  . Chronic bronchitis    was on Dexamethasone and has been off for surgery  . DDD (degenerative disc disease)   . Depression    takes Prozac daily  . Diverticulosis    2006 colonoscopy  . DJD (degenerative joint disease)   . Dysrhythmia    Rt BBB and Tacycardia  . Emphysema    uses Advair inhaler bid and Albuterol prn  . Emphysema   . 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 GERD (gastroesophageal reflux disease)    takes Omeprazole daily  . Headache(784.0)    migraines stopped with menopause  . History of blood transfusion   . History of colon polyps   . History of migraine   . History of staph infection 2013  . Joint pain   . Joint swelling   . Lyme disease 2013  . Muscle spasms of lower extremity    takes Flexeril daily as needed  . Neuropathy   . OCD (obsessive compulsive disorder)   . On home oxygen therapy   . Osteoporosis   . 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 Short-term memory loss   . Shortness of breath    with exertion  . Unspecified essential hypertension    takes Ramipril,Catapres,and Metoprolol daily  . Urinary frequency   . Urinary urgency   .  UTI (lower urinary tract infection) hx of   was on a Sulfa med-placed on it 08-05-13 as precaution for surgery   Family History  Problem Relation Age of Onset  . Cervical cancer Maternal Aunt   . Emphysema Mother   . Heart disease Mother   . Diabetes Father   . Diabetes Maternal Grandmother   . Heart disease Maternal Grandmother   . Lung disease Brother    Past Surgical History:  Procedure Laterality Date  . ABDOMINAL HYSTERECTOMY    . CHOLECYSTECTOMY    . COLONOSCOPY    . JOINT REPLACEMENT Left 08/19/13   hip  . Porta catheter Right 2011  . right arm surgery     fracure repair  . right arm surgery    . TOTAL ABDOMINAL HYSTERECTOMY W/ BILATERAL SALPINGOOPHORECTOMY    . TOTAL HIP ARTHROPLASTY Left 08/18/2013   Procedure: LEFT TOTAL HIP ARTHROPLASTY ANTERIOR APPROACH;  Surgeon: Mcarthur Rossetti, MD;  Location: [facility];  Service: Orthopedics;  Laterality: Left;  . TOTAL HIP ARTHROPLASTY Right 10/06/2013   DR Ninfa Linden  . TOTAL HIP ARTHROPLASTY Right 10/06/2013   Procedure: RIGHT TOTAL HIP ARTHROPLASTY ANTERIOR APPROACH;  Surgeon: Mcarthur Rossetti,  MD;  Location: [facility];  Service: Orthopedics;  Laterality: Right;  . WRIST SURGERY Right    ligament repair    Short Social History:  Social History   Tobacco Use  . Smoking status: Former Smoker    Types: Cigarettes    Quit date: 09/04/1995    Years since quitting: 25.3  . Smokeless tobacco: Never Used  . Tobacco comment: quit in 1997  Substance Use Topics  . Alcohol use: No    Comment: recovering alcoholic, quit in 0000000    No Known Allergies  Current Outpatient Medications  Medication Sig Dispense Refill  . ammonium lactate (LAC-HYDRIN) 12 % lotion Apply 1 application topically 3 (three) times a week.    Marland Kitchen apixaban (ELIQUIS) 5 MG TABS tablet Take 5 mg by mouth 2 (two) times daily.    Marland Kitchen aspirin 81 MG tablet Take 1 tablet (81 mg total) by mouth daily. 30 tablet   . atorvastatin (LIPITOR) 40 MG tablet Take 40 mg by  mouth daily.    . benzonatate (TESSALON) 100 MG capsule Take 100 mg by mouth 2 (two) times daily as needed for cough.    Marland Kitchen CARAFATE 1 GM/10ML suspension Take 2 g by mouth daily.     . celecoxib (CELEBREX) 200 MG capsule Take 200 mg by mouth 2 (two) times daily.    . cloNIDine (CATAPRES) 0.1 MG tablet Take 0.1 mg by mouth as needed.     . clotrimazole (MYCELEX) 10 MG troche Take 10 mg by mouth every 4 (four) hours as needed.    . cyclobenzaprine (FLEXERIL) 10 MG tablet Take 10 mg by mouth at bedtime as needed for muscle spasms. May also take 3 x a week if needed.    . digoxin (LANOXIN) 0.125 MG tablet Take 1 tablet (0.125 mg total) by mouth daily. 90 tablet 0  . diltiazem (CARDIZEM SR) 120 MG 12 hr capsule TAKE 1 CAPSULE (120 MG TOTAL) BY MOUTH 2 (TWO) TIMES DAILY. 180 capsule 3  . diphenhydrAMINE (BENADRYL) 25 mg capsule Take 25 mg by mouth every 6 (six) hours as needed (cough).    . DULoxetine (CYMBALTA) 30 MG capsule Take 90 mg by mouth daily.    . fexofenadine (ALLEGRA) 180 MG tablet Take 180 mg by mouth daily.    . fluticasone (FLONASE) 50 MCG/ACT nasal spray Place 1 spray into both nostrils daily as needed for allergies or rhinitis.    . folic acid (FOLVITE) 1 MG tablet Take 1 mg by mouth daily.    Marland Kitchen gabapentin (NEURONTIN) 300 MG capsule Take 600 mg by mouth 2 (two) times daily. TAKE 900 MG AT BEDTIME AND 300 MG 1-2 TIMES IN THE DAYTIME.    Marland Kitchen guaifenesin (HUMIBID E) 400 MG TABS tablet Take 400 mg by mouth 2 (two) times daily as needed (cough).    Marland Kitchen guaiFENesin-codeine (ROBITUSSIN AC) 100-10 MG/5ML syrup Take 5 mLs by mouth 3 (three) times daily as needed for cough.    . levothyroxine (SYNTHROID) 137 MCG tablet Take 137 mcg by mouth daily before breakfast.    . losartan (COZAAR) 50 MG tablet TAKE 1 TABLET BY MOUTH EVERY DAY 90 tablet 0  . methylphenidate (RITALIN) 10 MG tablet Take 10 mg by mouth 2 (two) times daily.    . metoprolol succinate (TOPROL-XL) 50 MG 24 hr tablet Take 1 tablet (50  mg total) by mouth daily. Take with or immediately following a meal. 90 tablet 3  . montelukast (SINGULAIR) 10 MG tablet Take 10  mg by mouth at bedtime.     . mupirocin ointment (BACTROBAN) 2 % Apply 1 application topically daily as needed (sores on legs).    . nitroGLYCERIN (NITROSTAT) 0.4 MG SL tablet Place 0.4 mg under the tongue every 5 (five) minutes as needed for chest pain.    Marland Kitchen nystatin cream (MYCOSTATIN) Apply 1 application topically 3 (three) times daily as needed for dry skin.    Marland Kitchen omeprazole (PRILOSEC) 20 MG capsule Take 20 mg by mouth daily.    . prochlorperazine (COMPAZINE) 10 MG tablet Take 10 mg by mouth every 4 (four) hours as needed for nausea or vomiting.    . promethazine (PHENERGAN) 25 MG suppository Place 25 mg rectally every 6 (six) hours as needed for nausea or vomiting.    . Vitamin D, Ergocalciferol, (DRISDOL) 50000 UNITS CAPS capsule Take 50,000 Units by mouth every 7 (seven) days. Wednesday     Current Facility-Administered Medications  Medication Dose Route Frequency Provider Last Rate Last Admin  . lidocaine (PF) (XYLOCAINE) 1 % injection 0.3 mL  0.3 mL Other Once Magnus Sinning, MD        Review of Systems  Constitutional:  Constitutional negative. HENT: HENT negative.  Eyes: Positive for visual disturbance.   Cardiovascular: Cardiovascular negative.  GI: Gastrointestinal negative.  Musculoskeletal: Musculoskeletal negative.  Skin: Skin negative.  Neurological: Neurological negative. Hematologic: Hematologic/lymphatic negative.  Psychiatric: Psychiatric negative.        Objective:  Objective   Vitals:   01/17/21 1212  BP: (!) 112/56  Pulse: 95  Resp: 20  Temp: 98.2 F (36.8 C)  SpO2: 99%    Physical Exam HENT:     Head: Normocephalic.     Nose:     Comments: Wearing a mask Eyes:     Pupils: Pupils are equal, round, and reactive to light.  Cardiovascular:     Rate and Rhythm: Normal rate.     Pulses:          Radial pulses are 2+ on  the right side and 2+ on the left side.       Popliteal pulses are 2+ on the right side and 2+ on the left side.  Pulmonary:     Effort: Pulmonary effort is normal.  Abdominal:     General: Abdomen is flat.     Palpations: Abdomen is soft. There is no mass.  Musculoskeletal:        General: Normal range of motion.  Skin:    Capillary Refill: Capillary refill takes less than 2 seconds.  Neurological:     General: No focal deficit present.     Mental Status: She is alert.  Psychiatric:        Mood and Affect: Mood normal.        Behavior: Behavior normal.        Thought Content: Thought content normal.        Judgment: Judgment normal.     Data: CTA neck IMPRESSION: 1. Positive for progressive complex soft and calcified plaque of the Right carotid since 2017 now with High-grade RADIOGRAPHIC-STRING-SIGN stenosis of the Right ICA over a segment of about 6 mm. The vessel remains patent.  2. Comparatively mild for age atherosclerosis in the head and neck elsewhere. Increased left ICA origin plaque since 2017 but no significant stenosis. Mild generalized vessel tortuosity in the head and neck.  3. 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 But no acute intracranial abnormality  is identified.  4. Progressed, severe Emphysema (ICD10-J43.9). Aortic Atherosclerosis (ICD10-I70.0).     Assessment/Plan:     80 year old female with previous symptomatic right ICA stenosis that now appears to be string sign by CT scan.  Unfortunately she would not be a candidate for stenting given the high calcium burden that is circumferential.  I discussed with her the options of carotid endarterectomy versus continued medical therapy.  Patient is going to follow-up with her pulmonologist in [place] as well as Dr. Claiborne Billings with cardiology.  We will regroup in 4 to 6 weeks to discuss her options.  Given that she is on 3 L nasal cannula she may be best  served with medical therapy and she demonstrates good understanding.  We discussed the signs and symptoms of stroke and she demonstrates good understanding of these.  We also reviewed her CT scan today.     Waynetta Sandy MD [facility]

## 2021-01-22 ENCOUNTER — Other Ambulatory Visit: Payer: Self-pay | Admitting: Cardiovascular Disease

## 2021-01-23 NOTE — Telephone Encounter (Signed)
Rx(s) sent to pharmacy electronically.  

## 2021-02-09 ENCOUNTER — Ambulatory Visit: Payer: Medicare Other | Admitting: Cardiovascular Disease

## 2021-02-17 ENCOUNTER — Encounter: Payer: Self-pay | Admitting: *Deleted

## 2021-02-17 ENCOUNTER — Encounter: Payer: Self-pay | Admitting: Vascular Surgery

## 2021-02-17 ENCOUNTER — Other Ambulatory Visit: Payer: Self-pay | Admitting: *Deleted

## 2021-02-17 ENCOUNTER — Other Ambulatory Visit: Payer: Self-pay

## 2021-02-17 ENCOUNTER — Ambulatory Visit (INDEPENDENT_AMBULATORY_CARE_PROVIDER_SITE_OTHER): Payer: Medicare Other | Admitting: Vascular Surgery

## 2021-02-17 VITALS — BP 90/50 | HR 78 | Temp 97.7°F | Resp 16 | Ht 62.0 in | Wt 130.0 lb

## 2021-02-17 DIAGNOSIS — I6523 Occlusion and stenosis of bilateral carotid arteries: Secondary | ICD-10-CM | POA: Diagnosis not present

## 2021-02-17 NOTE — Progress Notes (Signed)
Patient ID: OZELLE BRUBACHER, female   DOB: 28-Jul-1941, 80 y.o.   MRN: [id number]  Reason for Consult: Follow-up   Referred by Dustin Flock, PA-C  Subjective:     HPI: TAWN FITZNER is a 80 y.o. female with history of carotid artery stenosis that has been deemed to be symptomatic.  She has visual disturbances as narrowing of her vision.  She does not really have any stroke, TIA or amaurosis.  She is on 3 L nasal cannula.  She is on anticoagulation for atrial fibrillation previous DVT.  She is seeing her pulmonologist he has not seen cardiology.  At last visit we discussed her options being medical therapy or carotid endarterectomy but that she is not a stent candidate due to high calcium burden.  At this time she follows up for further evaluation and to discuss surgical options.  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)    was taking steroids-came off in May 2014   Short-term memory loss    Shortness of breath    with  exertion   Unspecified essential hypertension    takes Ramipril,Catapres,and Metoprolol daily   Urinary frequency    Urinary urgency    UTI (lower urinary tract infection) hx of   was on a Sulfa med-placed on it 08-05-13 as precaution for surgery   Family History  Problem Relation Age of Onset   Cervical cancer Maternal Aunt    Emphysema Mother    Heart disease Mother    Diabetes Father    Diabetes Maternal Grandmother    Heart disease Maternal Grandmother    Lung disease Brother    Past Surgical History:  Procedure Laterality Date   ABDOMINAL HYSTERECTOMY     CHOLECYSTECTOMY     COLONOSCOPY     JOINT REPLACEMENT Left 08/19/13   hip   Porta catheter Right 2011   right arm surgery     fracure repair   right arm surgery     TOTAL ABDOMINAL HYSTERECTOMY W/ BILATERAL SALPINGOOPHORECTOMY     TOTAL HIP ARTHROPLASTY Left 08/18/2013   Procedure: LEFT TOTAL HIP ARTHROPLASTY ANTERIOR APPROACH;  Surgeon: Mcarthur Rossetti, MD;  Location: [facility];  Service: Orthopedics;  Laterality: Left;   TOTAL HIP ARTHROPLASTY Right  10/06/2013   DR Ninfa Linden   TOTAL HIP ARTHROPLASTY Right 10/06/2013   Procedure: RIGHT TOTAL HIP ARTHROPLASTY ANTERIOR APPROACH;  Surgeon: Mcarthur Rossetti, MD;  Location: [facility];  Service: Orthopedics;  Laterality: Right;   WRIST SURGERY Right    ligament repair    Short Social History:  Social History   Tobacco Use   Smoking status: Former    Pack years: 0.00    Types: Cigarettes    Quit date: 09/04/1995    Years since quitting: 25.4   Smokeless tobacco: Never   Tobacco comments:    quit in 1997  Substance Use Topics   Alcohol use: No    Comment: recovering alcoholic, quit in 7654    No Known Allergies  Current Outpatient Medications  Medication Sig Dispense Refill   ammonium lactate (LAC-HYDRIN) 12 % lotion Apply 1 application topically 3 (three) times a week.     apixaban (ELIQUIS) 5 MG TABS tablet Take 5 mg by mouth 2 (two) times daily.      aspirin 81 MG tablet Take 1 tablet (81 mg total) by mouth daily. 30 tablet    atorvastatin (LIPITOR) 40 MG tablet Take 40 mg by mouth daily.     benzonatate (TESSALON) 100 MG capsule Take 100 mg by mouth 2 (two) times daily as needed for cough.     CARAFATE 1 GM/10ML suspension Take 2 g by mouth daily.      celecoxib (CELEBREX) 200 MG capsule Take 200 mg by mouth 2 (two) times daily.     cloNIDine (CATAPRES) 0.1 MG tablet Take 0.1 mg by mouth as needed.      clotrimazole (MYCELEX) 10 MG troche Take 10 mg by mouth every 4 (four) hours as needed.     cyclobenzaprine (FLEXERIL) 10 MG tablet Take 10 mg by mouth at bedtime as needed for muscle spasms. May also take 3 x a week if needed.     digoxin (LANOXIN) 0.125 MG tablet Take 1 tablet (0.125 mg total) by mouth daily. 90 tablet 0   diltiazem (CARDIZEM SR) 120 MG 12 hr capsule TAKE 1 CAPSULE (120 MG TOTAL) BY MOUTH 2 (TWO) TIMES DAILY. 180 capsule 3   diphenhydrAMINE (BENADRYL) 25 mg capsule Take 25 mg by mouth every 6 (six) hours as needed (cough).     DULoxetine (CYMBALTA) 30 MG capsule Take 90 mg by mouth daily.     fexofenadine (ALLEGRA) 180 MG tablet Take 180 mg by mouth daily.     fluticasone (FLONASE) 50 MCG/ACT nasal spray Place 1 spray into both nostrils daily as needed for allergies or rhinitis.     folic acid (FOLVITE) 1 MG tablet Take 1 mg by mouth daily.     gabapentin (NEURONTIN) 300 MG capsule Take 600 mg by mouth 2 (two) times daily. TAKE 900 MG AT BEDTIME AND 300 MG 1-2 TIMES IN THE DAYTIME.     guaifenesin (HUMIBID E) 400 MG TABS tablet Take 400 mg by mouth 2 (two) times daily as needed (cough).     guaiFENesin-codeine (ROBITUSSIN AC) 100-10 MG/5ML syrup Take 5 mLs by mouth 3 (three) times daily as needed for cough.     levothyroxine (SYNTHROID) 137 MCG tablet Take 137 mcg by mouth daily before breakfast.     losartan (COZAAR) 50 MG tablet TAKE 1 TABLET BY MOUTH EVERY DAY 90 tablet 0   methylphenidate (RITALIN) 10 MG tablet Take 10  mg by mouth 2 (two) times daily.     metoprolol succinate (TOPROL-XL)  50 MG 24 hr tablet Take 1 tablet (50 mg total) by mouth daily. Take with or immediately following a meal. 90 tablet 3   montelukast (SINGULAIR) 10 MG tablet Take 10 mg by mouth at bedtime.      mupirocin ointment (BACTROBAN) 2 % Apply 1 application topically daily as needed (sores on legs).     nitroGLYCERIN (NITROSTAT) 0.4 MG SL tablet Place 0.4 mg under the tongue every 5 (five) minutes as needed for chest pain.     nystatin cream (MYCOSTATIN) Apply 1 application topically 3 (three) times daily as needed for dry skin.     omeprazole (PRILOSEC) 20 MG capsule Take 20 mg by mouth daily.     prochlorperazine (COMPAZINE) 10 MG tablet Take 10 mg by mouth every 4 (four) hours as needed for nausea or vomiting.     promethazine (PHENERGAN) 25 MG suppository Place 25 mg rectally every 6 (six) hours as needed for nausea or vomiting.     Vitamin D, Ergocalciferol, (DRISDOL) 50000 UNITS CAPS capsule Take 50,000 Units by mouth every 7 (seven) days. Wednesday     Current Facility-Administered Medications  Medication Dose Route Frequency Provider Last Rate Last Admin   lidocaine (PF) (XYLOCAINE) 1 % injection 0.3 mL  0.3 mL Other Once Magnus Sinning, MD        Review of Systems  HENT: HENT negative.  Eyes: Eyes negative.  Respiratory: Positive for shortness of breath.  GI: Gastrointestinal negative.  Musculoskeletal: Musculoskeletal negative.  Skin: Skin negative.  Neurological: Neurological negative. Hematologic: Positive for bruises/bleeds easily.  Psychiatric: Psychiatric negative.       Objective:  Objective   Vitals:   02/17/21 1515  BP: (!) 90/50  Pulse: 78  Resp: 16  Temp: 97.7 F (36.5 C)  SpO2: 93%  Weight: 130 lb (59 kg)  Height: 5\' 2"  (1.575 m)   Body mass index is 23.78 kg/m.  Physical Exam  Data: CT scan was again reviewed today     Assessment/Plan:     80 year old female with previously  symptomatic right ICA stenosis that is string sign by CT.  We again discussed her options and stenting is not a possibility so we are left with medical therapy or carotid endarterectomy.  Patient has been cleared by her pulmonologist we will get her to see her cardiologist and plan for right carotid endarterectomy as long as she is cleared from that standpoint.  She is on 3 L nasal cannula.  I discussed the risk benefits and alternatives and she demonstrates good understanding.     Waynetta Sandy MD [facility]

## 2021-02-24 ENCOUNTER — Other Ambulatory Visit: Payer: Self-pay | Admitting: Cardiovascular Disease

## 2021-02-28 ENCOUNTER — Encounter (HOSPITAL_COMMUNITY): Payer: Self-pay | Admitting: Emergency Medicine

## 2021-02-28 ENCOUNTER — Inpatient Hospital Stay (HOSPITAL_COMMUNITY)
Admission: RE | Admit: 2021-02-28 | Discharge: 2021-02-28 | Disposition: A | Discharge status: Home / Self Care | Payer: Medicare Other | Source: Ambulatory Visit

## 2021-02-28 ENCOUNTER — Telehealth: Payer: Self-pay | Admitting: Cardiovascular Disease

## 2021-02-28 NOTE — Telephone Encounter (Signed)
Left message for the pt to call back so we may try and get her in sooner for pre op clearance.

## 2021-02-28 NOTE — Telephone Encounter (Signed)
   Name: Stacey Bowman  DOB: 31-Oct-1940  MRN: [id number]  Primary Cardiologist: Shelva Majestic, MD  Chart reviewed as part of pre-operative protocol coverage. Because of Stacey Bowman's past medical history and time since last visit, she will require a follow-up visit in order to better assess preoperative cardiovascular risk.  She has an appt with Dr. Claiborne Billings in Aug. Can we see if this can be moved up? If not, please adjust appt notes.   Pre-op covering staff: - Please schedule appointment and call patient to inform them. If patient already had an upcoming appointment within acceptable timeframe, please add "pre-op clearance" to the appointment notes so provider is aware. - Please contact requesting surgeon's office via preferred method (i.e, phone, fax) to inform them of need for appointment prior to surgery.  If applicable, this message will also be routed to pharmacy pool and/or primary cardiologist for input on holding anticoagulant/antiplatelet agent as requested below so that this information is available to the clearing provider at time of patient's appointment.   Tami Lin Mavis Gravelle, PA  02/28/2021, 3:01 PM

## 2021-02-28 NOTE — Telephone Encounter (Signed)
       [facility] Pre-operative Risk Assessment    Patient Name: Stacey Bowman  DOB: 1941/06/02  MRN: [id number]   HEARTCARE STAFF: - Please ensure there is not already an duplicate clearance open for this procedure. - Under Visit Info/Reason for Call, type in Other and utilize the format Clearance MM/DD/YY or Clearance TBD. Do not use dashes or single digits. - If request is for dental extraction, please clarify the # of teeth to be extracted. - If the patient is currently at the dentist's office, call Pre-Op APP to address. If the patient is not currently in the dentist office, please route to the Pre-Op pool  Request for surgical clearance:  What type of surgery is being performed? CEA  When is this surgery scheduled? TBD  What type of clearance is required (medical clearance vs. Pharmacy clearance to hold med vs. Both)? Medical   Are there any medications that need to be held prior to surgery and how long? Eliquis held 2 days prior procedure  Practice name and name of physician performing surgery? Dr. Servando Snare - [facility]  What is the office phone number? [phone]   7.   What is the office fax number? [phone]  8.   Anesthesia type (None, local, MAC, general) ? General  The pt was scheduled tomorrow but they have to cancel for clearance, they need clearance as soon as possible to r/s pt  Stacey Bowman 02/28/2021, 1:17 PM  _________________________________________________________________   (provider comments below)

## 2021-02-28 NOTE — Telephone Encounter (Signed)
Patient with diagnosis of afib on Eliquis for anticoagulation.    Procedure: CEA-carotid endarterectomy Date of procedure: TBD  CHA2DS2-VASc Score = 4  This indicates a 4.8% annual risk of stroke. The patient's score is based upon: CHF History: No HTN History: Yes Diabetes History: No Stroke History: No Vascular Disease History: No Age Score: 2 Gender Score: 1     Prior to clearance, patient will need updated BMP and CBC

## 2021-02-28 NOTE — Progress Notes (Signed)
Surgical Instructions    Your procedure is scheduled on 03/01/21.  Report to [facility] Main Entrance "A" at 09:10 A.M., then check in with the Admitting office.  Call this number if you have problems the morning of surgery:  [phone]   If you have any questions prior to your surgery date call [phone]: Open Monday-Friday 8am-4pm    Remember:  Do not eat or drink after midnight the night before your surgery      Take these medicines the morning of surgery with A SIP OF WATER  albuterol (VENTOLIN HFA) inhaler if needed Aspirin CARAFATE clotrimazole (MYCELEX)  digoxin (LANOXIN) diltiazem (CARDIZEM SR) DULoxetine (CYMBALTA) diphenhydrAMINE (BENADRYL)  fexofenadine (ALLEGRA) fluticasone-salmeterol (ADVAIR) gabapentin (NEURONTIN) levothyroxine (SYNTHROID) nitroGLYCERIN (NITROSTAT) if needed promethazine (PHENERGAN) if needed traMADol (ULTRAM) if needed   As of today, STOP taking any  (unless otherwise instructed by your surgeon) Aleve, Naproxen, Ibuprofen, Motrin, Advil, Goody's, BC's, all herbal medications, fish oil, celecoxib (CELEBREX) and all vitamins.  Please follow your surgeons instructions on when to stop taking Eliquis.           Do not wear jewelry or makeup Do not wear lotions, powders, perfumes/colognes, or deodorant. Do not shave 48 hours prior to surgery.  Men may shave face and neck. Do not bring valuables to the hospital.  DO Not wear nail polish, gel polish, artificial nails, or any other type of covering on natural nails  including finger and toenails. If patients have artificial nails, gel coating, etc. that need to be removed by a nail salon please have this removed prior to surgery or surgery may need to be canceled/delayed if the surgeon/ anesthesia feels like the patient is unable to be adequately monitored.             [facility] is not responsible for any belongings or valuables.  Do NOT Smoke (Tobacco/Vaping) or drink Alcohol 24 hours  prior to your procedure If you use a CPAP at night, you may bring all equipment for your overnight stay.   Contacts, glasses, dentures or bridgework may not be worn into surgery, please bring cases for these belongings   For patients admitted to the hospital, discharge time will be determined by your treatment team.   Patients discharged the day of surgery will not be allowed to drive home, and someone needs to stay with them for 24 hours.  ONLY 1 SUPPORT PERSON MAY BE PRESENT WHILE YOU ARE IN SURGERY. IF YOU ARE TO BE ADMITTED ONCE YOU ARE IN YOUR ROOM YOU WILL BE ALLOWED TWO (2) VISITORS.  Minor children may have two parents present. Special consideration for safety and communication needs will be reviewed on a case by case basis.  Special instructions:    Oral Hygiene is also important to reduce your risk of infection.  Remember - BRUSH YOUR TEETH THE MORNING OF SURGERY WITH YOUR REGULAR TOOTHPASTE   [facility]- Preparing For Surgery  Before surgery, you can play an important role. Because skin is not sterile, your skin needs to be as free of germs as possible. You can reduce the number of germs on your skin by washing with CHG (chlorahexidine gluconate) Soap before surgery.  CHG is an antiseptic cleaner which kills germs and bonds with the skin to continue killing germs even after washing.     Please do not use if you have an allergy to CHG or antibacterial soaps. If your skin becomes reddened/irritated stop using the CHG.  Do not shave (including  legs and underarms) for at least 48 hours prior to first CHG shower. It is OK to shave your face.  Please follow these instructions carefully.     Shower the NIGHT BEFORE SURGERY and the MORNING OF SURGERY with CHG Soap.   If you chose to wash your hair, wash your hair first as usual with your normal shampoo. After you shampoo, rinse your hair and body thoroughly to remove the shampoo.  Then [employer] and genitals (private parts) with your  normal soap and rinse thoroughly to remove soap.  After that Use CHG Soap as you would any other liquid soap. You can apply CHG directly to the skin and wash gently with a scrungie or a clean washcloth.   Apply the CHG Soap to your body ONLY FROM THE NECK DOWN.  Do not use on open wounds or open sores. Avoid contact with your eyes, ears, mouth and genitals (private parts). Wash Face and genitals (private parts)  with your normal soap.   Wash thoroughly, paying special attention to the area where your surgery will be performed.  Thoroughly rinse your body with warm water from the neck down.  DO NOT shower/wash with your normal soap after using and rinsing off the CHG Soap.  Pat yourself dry with a CLEAN TOWEL.  Wear CLEAN PAJAMAS to bed the night before surgery  Place CLEAN SHEETS on your bed the night before your surgery  DO NOT SLEEP WITH PETS.   Day of Surgery: Take a shower with CHG soap. Wear Clean/Comfortable clothing the morning of surgery Do not apply any deodorants/lotions.   Remember to brush your teeth WITH YOUR REGULAR TOOTHPASTE.   Please read over the following fact sheets that you were given.

## 2021-03-01 ENCOUNTER — Inpatient Hospital Stay (HOSPITAL_COMMUNITY): Admission: RE | Admit: 2021-03-01 | Payer: Medicare Other | Source: Home / Self Care | Admitting: Vascular Surgery

## 2021-03-01 ENCOUNTER — Encounter (HOSPITAL_COMMUNITY): Admission: RE | Payer: Self-pay | Source: Home / Self Care

## 2021-03-01 SURGERY — ENDARTERECTOMY, CAROTID
Anesthesia: General | Laterality: Right

## 2021-03-01 NOTE — Telephone Encounter (Signed)
Received a call from [place] at [facility] wanting to know if the patient has an appointment scheduled with our office. I informed her that we left the patient a message and have not heard back yet. She states the patient can be hard to reach sometimes. She stated she would contact the patient and have the patient contact our office.   Left message for patient to contact office.

## 2021-03-01 NOTE — Telephone Encounter (Signed)
Received a call from patient stating she is returning a call to see if she can be worked in. She states she can only be see on Mondays, Tuesdays and Fridays after 2pm.   I searched [facility] and [employer] and let the patient know I did not have anything in July or August that could accommodate her availability. I offered the [place] location, but the patient declined and stated she does not want her driver to lost. Patient then stated she thought being worked in meant that we call patients on Dr Evette Georges schedule and see if they would be willing to give up their appointment so another patient could be seen sooner. I advised her that is not how being worked in works. I advised patient that because of her specific availability I could get her a sooner appointment then the one she already has with Dr Claiborne Billings scheduled for June 23, 2021 at 4pm. She states she will just keep that appointment. I advised the patient that the procedure that is needed can not be completed until after a provider at our office gives her clearance. She voiced understanding.   She requested that we contact her other providers office and make them aware that we can not get her in sooner. I told her I would call then she stated she would call them herself.   Spoke with Herma Ard the surgery scheduler and made her aware that we offered the patient multiple appointments but the patient declined. I informed her that the patients driver has limited availability. She voiced understanding and stated he will make [facility] aware but also inform the patient that she can not have her procedure done until she is given clearance.

## 2021-03-08 NOTE — Telephone Encounter (Signed)
Call placed to pt to offer her a sooner appt and to make her aware of the [facility] Transportation services that can assist her in getting to her appointment.  ([facility] [phone]) No answer / no machine.

## 2021-03-10 NOTE — Telephone Encounter (Signed)
2nd attempt to reach pt to help get a sooner appointment and offer transportation services through [facility].  Left a message for her to call back.

## 2021-03-13 NOTE — Telephone Encounter (Signed)
Pt is scheduled to see Dr. Claiborne Billings 10/202. Called pt and left message to see if we can move appt up sooner for her surgery. If no available appt's at either [facility] or [facility]. Location, let's try and offer appt at the [place] location if need be to help try and get pt cleared for her surgery.

## 2021-03-13 NOTE — Telephone Encounter (Signed)
Left message for pt also to assist with transportation. No answer. Will mail additional transportation resources to home address on file as well.   Westley Hummer, MSW, [place]  [phone]

## 2021-03-13 NOTE — Telephone Encounter (Signed)
3rd attempt to reach the pt to set up pre op appt and offer [facility] [phone].

## 2021-03-15 NOTE — Telephone Encounter (Signed)
Message routed back to me this morning, I called again and was able to reach pt at [phone]. Introduced self, role, reason for call. Pt confirms she has rides but they sometimes are limited. I offered enrollment in our [employer] Pt states that she is interested. I will send referral in, I have also mailed resources for alternate [employer] LCSW provided pt with number for [facility] office to get scheduled and made her aware that the office has reached out to her several times, she states she does not check her voicemails. I encouraged her to do so in case additional providers have been trying to reach her.   Westley Hummer, MSW, [place]  [phone]

## 2021-03-15 NOTE — Telephone Encounter (Signed)
Pt has been scheduled for surgical clearance appointment, however, it was a little further out.  Have tried to reach pt numerous times to offer sooner appointment along with offering [facility], but have been unsuccessful.    Will route back to the surgeon's office and the LCSW that was helping with this, and if they can reach pt, they can have her call the office back, otherwise, will delete if out of our pool.

## 2021-03-20 ENCOUNTER — Other Ambulatory Visit: Payer: Self-pay | Admitting: Cardiovascular Disease

## 2021-03-21 ENCOUNTER — Other Ambulatory Visit: Payer: Self-pay | Admitting: Hematology and Oncology

## 2021-03-21 DIAGNOSIS — C541 Malignant neoplasm of endometrium: Secondary | ICD-10-CM

## 2021-03-23 ENCOUNTER — Inpatient Hospital Stay: Payer: Medicare Other | Attending: Oncology

## 2021-03-23 ENCOUNTER — Encounter: Payer: Self-pay | Admitting: Hematology and Oncology

## 2021-03-23 DIAGNOSIS — C541 Malignant neoplasm of endometrium: Secondary | ICD-10-CM | POA: Diagnosis present

## 2021-03-23 LAB — LIPID PANEL
Cholesterol: 114 (ref 0–200)
HDL: 43 (ref 35–70)
LDL Cholesterol: 52
LDl/HDL Ratio: 2.7
Triglycerides: 97 (ref 40–160)

## 2021-03-23 LAB — HEPATIC FUNCTION PANEL
ALT: 19 (ref 7–35)
AST: 33 (ref 13–35)
Alkaline Phosphatase: 70 (ref 25–125)
Bilirubin, Total: 0.6

## 2021-03-23 LAB — CBC AND DIFFERENTIAL
HCT: 37 (ref 36–46)
Hemoglobin: 11.8 — AB (ref 12.0–16.0)
Neutrophils Absolute: 4.8
Platelets: 319 (ref 150–399)
WBC: 8

## 2021-03-23 LAB — CBC: RBC: 4.55 (ref 3.87–5.11)

## 2021-03-23 LAB — BASIC METABOLIC PANEL
BUN: 14 (ref 4–21)
CO2: 28 — AB (ref 13–22)
Chloride: 100 (ref 99–108)
Creatinine: 0.6 (ref 0.5–1.1)
Glucose: 82
Potassium: 3.5 (ref 3.4–5.3)
Sodium: 139 (ref 137–147)

## 2021-03-23 LAB — HEMOGLOBIN A1C: Hemoglobin A1C: 6.3

## 2021-03-23 LAB — COMPREHENSIVE METABOLIC PANEL
Albumin: 4.3 (ref 3.5–5.0)
Calcium: 9 (ref 8.7–10.7)

## 2021-03-24 LAB — CA 125: Cancer Antigen (CA) 125: 33 U/mL (ref 0.0–38.1)

## 2021-05-25 ENCOUNTER — Other Ambulatory Visit: Payer: Self-pay | Admitting: Cardiovascular Disease

## 2021-05-29 ENCOUNTER — Ambulatory Visit: Payer: Self-pay | Admitting: Internal Medicine

## 2021-06-23 ENCOUNTER — Ambulatory Visit: Payer: Medicare Other | Admitting: Cardiovascular Disease

## 2021-06-30 ENCOUNTER — Ambulatory Visit: Payer: Medicare Other | Admitting: Internal Medicine

## 2021-07-05 ENCOUNTER — Other Ambulatory Visit: Payer: Self-pay | Admitting: Oncology

## 2021-07-05 ENCOUNTER — Telehealth: Payer: Self-pay | Admitting: Oncology

## 2021-07-05 DIAGNOSIS — C541 Malignant neoplasm of endometrium: Secondary | ICD-10-CM

## 2021-07-05 NOTE — Telephone Encounter (Signed)
Patient scheduled for 11/21 Labs 3:30 pm - Follow Up 4:00 pm

## 2021-07-06 ENCOUNTER — Telehealth: Payer: Self-pay | Admitting: Cardiovascular Disease

## 2021-07-06 NOTE — Telephone Encounter (Signed)
New Message:      Pt would like to switch from Dr Claiborne Billings to Dr Geraldo Pitter please. She said it is hard for her to get transportation to [place] is close to her home.

## 2021-07-14 NOTE — Telephone Encounter (Signed)
Ok by me

## 2021-07-17 NOTE — Progress Notes (Incomplete)
Stacey Bowman  [address] [place],  [place]  [address] [phone]  Clinic Day:  07/17/2021  Referring physician: Dustin Flock, PA-C  This document serves as a record of services personally performed by Hosie Poisson, MD. It was created on their behalf by Curry,Lauren E, a trained medical scribe. The creation of this record is based on the scribe's personal observations and the provider's statements to them.  ASSESSMENT & PLAN:   1. 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  She still has fluctuation of her CA 125, but it has been even higher than this in the past.  We will continue to monitor this.  2. Bilateral hip replacements.  The interference from this makes the CT of the pelvis very difficult to interpret.  3. Anemia, mild, but slightly worse.  She does have iron deficiency and I suggested that she get on oral iron supplement. I am not sure she will be willing to pursue a gastrointestinal evaluation in view of her many other comorbidities.   4. Stable pulmonary nodule, 4 mm, but now a new one measuring 1.3 cm.  I agree CT thorax should be repeated in 3-6 months.  She will let me know when that is done.  5. 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  I can see her back in 6-12 months with repeat CBC, comprehensive metabolic profile, and CA 480.  She understands and agrees with this plan of care.  I provided *** minutes of face-to-face time during this this encounter and > 50% was spent counseling as documented under my assessment and plan.    Derwood Kaplan, MD [place] [address] [place] [address] Dept: [phone] Dept Fax: [phone]    CHIEF COMPLAINT:  CC: Stage IIIC endometrial carcinoma  Current Treatment:   Surveillance   HISTORY OF PRESENT ILLNESS:  Stacey Bowman is a 80 y.o. 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 She was treated with total abdominal hysterectomy and bilateral salpingo-oophorectomy by laparoscopic technique.  Pathology revealed a grade 1 endometrial adenocarcinoma with 50% invasion into the myometrium.  2/20 nodes were positive for metastasis.  Estrogen and progesterone receptors were positive.  CA-125 was elevated prior to adjuvant chemotherapy.  A CT scan of the abdomen and pelvis did not reveal evidence of disease.  She received adjuvant chemotherapy with carboplatin and Taxol for 9 cycles at attenuated doses because of poor tolerance. She eventually had an allergic reaction to carboplatin and received weekly Taxol alone for the last part of her therapy.  This was completed in July 2012.  The CA-125 never normalized, but has fluctuated in a very low range.  It had gone up to 45.9 in November of 2015 but was back down to 33.4 one year ago, then back up to 46.0 in July 2016.  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  She has arthritis and has had injections through the orthopedic surgeon.  An MRI scan shows progressive scoliosis and spondylosis of the lumbar spine with degenerative disc disease and moderate foraminal stenosis at L2-L3, L3-4 and fairly severe at L4-5.  She also has significant osteoarthritis, and has had bilateral hip replacement.  She also had a CT of the chest  in December of 2016 which reveals a 5 mm left upper lobe nodule which was new from prior exams and she had repeat CT's of the chest in August of 2017 and February of 2018, which were stable.  She also has severe emphysema and a moderate hiatal hernia.  She finally had a bone density scan in February of 2017, which was normal.  Her appetite is good but she does have a hiatal hernia.  She has had frequent urinary tract infections.   She has chronic pain of her lower back with standing or walking.  She has occasional chest pains and nitroglycerin does help.  Her dyspnea has worsened and there is a question of pulmonary fibrosis.  Dr. Gardiner Rhyme is following serial scans to monitor her pulmonary nodule.  She had one in January which showed resolution of the nodule in the left upper lobe but she still had a chronic calcified granuloma of the right lower lobe.  A repeat scan in July revealed a stable 5 mm left lower lobe pulmonary nodule and stable calcified bilateral lower lobe granulomas with severe centrilobular emphysema.  However she now has a new 1.3 cm left upper lobe nodule in the anterior medial region.  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  She has a moderate hiatal hernia.  I reviewed this CT scan with her and answered her questions.  The lesion is not highly suspicious for malignancy but I agree with repeating a CT scan in 3-6 months.  Her CA 125 was down last year to 32.5.  INTERVAL HISTORY:  I have reviewed her chart and materials related to her cancer extensively and collaborated history with the patient. Summary of oncologic history is as follows: Oncology History   No history exists.    Karem is here for follow up after not being seen since August 2019.   Her  appetite is good, and she has gained/lost _ pounds since her last visit.  She denies fever, chills or other signs of infection.  She denies nausea, vomiting, bowel issues, or abdominal pain.  She denies sore throat, cough, dyspnea, or chest pain.  HISTORY:   Allergies: No Known Allergies  Current Medications: Current Outpatient Medications  Medication Sig Dispense Refill   albuterol (VENTOLIN HFA) 108 (90 Base) MCG/ACT inhaler Inhale 2 puffs into the lungs every 6 (six) hours as needed for wheezing or shortness of breath.     ammonium lactate (LAC-HYDRIN) 12 % lotion  Apply 1 application topically 3 (three) times a week.     apixaban (ELIQUIS) 5 MG TABS tablet Take 5 mg by mouth 2 (two) times daily.     aspirin 81 MG tablet Take 1 tablet (81 mg total) by mouth daily. 30 tablet    benzonatate (TESSALON) 100 MG capsule Take 100 mg by mouth 3 (three) times daily. 10a 4p and bedtime     CARAFATE 1 GM/10ML suspension Take 2 g by mouth daily.      celecoxib (CELEBREX) 200 MG capsule Take 200 mg by mouth 2 (two) times daily.     cloNIDine (CATAPRES) 0.1 MG tablet Take 0.1 mg by mouth at bedtime.     clotrimazole (MYCELEX) 10 MG troche Take 10 mg by mouth every 4 (four) hours as needed (thrush).     cyclobenzaprine (FLEXERIL) 10 MG tablet Take 10 mg by mouth at bedtime as needed for muscle spasms. May also take 3 x a week  if needed.     digoxin (LANOXIN) 0.125 MG tablet TAKE 1 TABLET BY MOUTH EVERY DAY 30 tablet 0   diltiazem (CARDIZEM SR) 120 MG 12 hr capsule TAKE 1 CAPSULE (120 MG TOTAL) BY MOUTH 2 (TWO) TIMES DAILY. 180 capsule 3   diphenhydrAMINE (BENADRYL) 25 mg capsule Take 25 mg by mouth in the morning and at bedtime.     DULoxetine (CYMBALTA) 30 MG capsule Take 90 mg by mouth daily.     fexofenadine (ALLEGRA) 180 MG tablet Take 180 mg by mouth daily.     fluticasone (FLONASE) 50 MCG/ACT nasal spray Place 1 spray into both nostrils daily as needed for allergies or rhinitis.     fluticasone-salmeterol (ADVAIR) 250-50 MCG/ACT AEPB Inhale 1 puff into the lungs in the morning and at bedtime.     gabapentin (NEURONTIN) 300 MG capsule Take 600-900 mg by mouth See admin instructions. 600 mg at 10a, 2p and 6p and 900 mg at bedtime     levothyroxine (SYNTHROID) 137 MCG tablet Take 137 mcg by mouth daily before breakfast.     losartan (COZAAR) 25 MG tablet Take 25 mg by mouth every evening.     losartan (COZAAR) 50 MG tablet TAKE 1 TABLET BY MOUTH EVERY DAY 30 tablet 0   montelukast (SINGULAIR) 10 MG tablet Take 10 mg by mouth at bedtime.      nitroGLYCERIN (NITROSTAT)  0.4 MG SL tablet Place 0.4 mg under the tongue every 5 (five) minutes as needed for chest pain.     nystatin cream (MYCOSTATIN) Apply 1 application topically 3 (three) times daily as needed for dry skin.     omeprazole (PRILOSEC) 20 MG capsule Take 20 mg by mouth every evening.     promethazine (PHENERGAN) 25 MG suppository Place 25 mg rectally every 6 (six) hours as needed for nausea or vomiting.     traMADol (ULTRAM) 50 MG tablet Take 50 mg by mouth every 6 (six) hours as needed for severe pain.     Vitamin D, Ergocalciferol, (DRISDOL) 50000 UNITS CAPS capsule Take 50,000 Units by mouth every 7 (seven) days. Wednesday     Current Facility-Administered Medications  Medication Dose Route Frequency Provider Last Rate Last Admin   lidocaine (PF) (XYLOCAINE) 1 % injection 0.3 mL  0.3 mL Other Once Magnus Sinning, MD        REVIEW OF SYSTEMS:  Review of Systems - Oncology    VITALS:  There were no vitals taken for this visit.  Wt Readings from Last 3 Encounters:  02/17/21 130 lb (59 kg)  01/17/21 130 lb (59 kg)  12/22/19 130 lb (59 kg)    There is no height or weight on file to calculate BMI.  Performance status (ECOG): {CHL ONC [id number]  PHYSICAL EXAM:  Physical Exam   LABS:   CBC Latest Ref Rng & Units 03/23/2021 10/09/2013 10/08/2013  WBC - 8.0 11.1(H) 15.8(H)  Hemoglobin 12.0 - 16.0 11.8(A) 7.8(L) 8.6(L)  Hematocrit 36 - 46 37 23.5(L) 26.7(L)  Platelets 150 - 399 319 200 230   CMP Latest Ref Rng & Units 03/23/2021 10/07/2013 09/30/2013  Glucose 70 - 99 mg/dL - 132(H) 110(H)  BUN 4 - 21 14 19 21   Creatinine 0.5 - 1.1 0.6 0.60 0.58  Sodium 137 - 147 139 140 141  Potassium 3.4 - 5.3 3.5 3.9 4.1  Chloride 99 - 108 100 101 102  CO2 13 - 22 28(A) 26 24  Calcium 8.7 - 10.7 9.0 7.8(L) 8.9  Total Protein 6.0 - 8.3 g/dL - - -  Total Bilirubin 0.3 - 1.2 mg/dL - - -  Alkaline Phos 25 - 125 70 - -  AST 13 - 35 33 - -  ALT 7 - 35 19 - -    Lab Results  Component Value Date    CAN125 33.0 03/23/2021      STUDIES:  No results found.   I, Rita Ohara, am acting as scribe for Marice Potter, MD    I have reviewed this report as typed by the medical scribe, and it is complete and accurate.  Dequincy Macarthur Critchley, MD

## 2021-07-18 ENCOUNTER — Telehealth: Payer: Self-pay | Admitting: Oncology

## 2021-07-18 NOTE — Telephone Encounter (Signed)
Patient rescheduled 11/21 Follow Up to 12/2 at 4:30 pm.  She Canceled 11/21 Labs due to having them done at PCP last wk

## 2021-07-24 ENCOUNTER — Other Ambulatory Visit: Payer: Medicare Other

## 2021-07-24 ENCOUNTER — Ambulatory Visit: Payer: Medicare Other | Admitting: Oncology

## 2021-07-26 NOTE — Progress Notes (Incomplete)
[facility]  [address] [place],  [place]  [address] [phone]  Clinic Day:  07/26/2021  Referring physician: Dustin Flock, PA-C  This document serves as a record of services personally performed by Hosie Poisson, MD. It was created on their behalf by Curry,Lauren E, a trained medical scribe. The creation of this record is based on the scribe's personal observations and the provider's statements to them.  ASSESSMENT & PLAN:   1. 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  She still has fluctuation of her CA 125, but it has been even higher than this in the past.  We will continue to monitor this.  2. Bilateral hip replacements.  The interference from this makes the CT of the pelvis very difficult to interpret.  3. Anemia, mild, but slightly worse.  She does have iron deficiency.  4. Stable pulmonary nodule, 4 mm, but now a new one measuring 1.3 cm.  I agree CT thorax should be repeated in 3-6 months.  She will let me know when that is done.  5. 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  I will suggest that she get on iron supplement for her iron deficiency but I am not sure she will be willing to pursue a gastrointestinal evaluation in view of her many other comorbidities.  I can see her back in 6-12 months with repeat CBC, comprehensive metabolic profile, and CA 656.  She understands and agrees with this plan of care.  I provided *** minutes of face-to-face time during this this encounter and > 50% was spent counseling as documented under my assessment and plan.    Derwood Kaplan, MD [facility] [address] [place] [address] Dept: [phone] Dept Fax: [phone]   CHIEF COMPLAINT:  CC: History of stage IIIC endometrial carcinoma  Current  Treatment:  Surveillance   HISTORY OF PRESENT ILLNESS:  Stacey Bowman is a 80 y.o. 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 She was treated with total abdominal hysterectomy and bilateral salpingo-oophorectomy by laparoscopic technique.  Pathology revealed a grade 1 endometrial adenocarcinoma with 50% invasion into the myometrium.  2/20 nodes were positive for metastasis.  Estrogen and progesterone receptors were positive.  CA-125 was elevated prior to adjuvant chemotherapy.  A CT scan of the abdomen and pelvis did not reveal evidence of disease.  She received adjuvant chemotherapy with carboplatin and Taxol for 9 cycles at attenuated doses because of poor tolerance. She eventually had an allergic reaction to carboplatin and received weekly Taxol alone for the last part of her therapy.  This was completed in July 2012.  The CA-125 never normalized, but has fluctuated in a very low range.  It had gone up to 45.9 in November of 2015 but was back down to 33.4 one year ago, then back up to 46.0 in July 2016.  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  She has arthritis and has had injections through the orthopedic surgeon.  An MRI scan shows progressive scoliosis and spondylosis of the lumbar spine with degenerative disc disease and moderate foraminal stenosis at L2-L3, L3-4 and fairly severe at L4-5.  She also has significant osteoarthritis, and has had bilateral hip replacement.  She also had a  CT of the chest in December of 2016 which reveals a 5 mm left upper lobe nodule which was new from prior exams and she had repeat CT's of the chest in August of 2017 and February of 2018, which were stable.  She also has severe emphysema and a moderate hiatal hernia.  She finally had a bone density scan in February of 2017, which was normal.  Her appetite is good but she does have a hiatal hernia.  She has had frequent  urinary tract infections.  She has chronic pain of her lower back with standing or walking.  She has occasional chest pains and nitroglycerin does help.  Her dyspnea has worsened and there is a question of pulmonary fibrosis.  Dr. Gardiner Rhyme is following serial scans to monitor her pulmonary nodule.  She had one in January which showed resolution of the nodule in the left upper lobe but she still had a chronic calcified granuloma of the right lower lobe.  A repeat scan in July revealed a stable 5 mm left lower lobe pulmonary nodule and stable calcified bilateral lower lobe granulomas with severe centrilobular emphysema.  However she now has a new 1.3 cm left upper lobe nodule in the anterior medial region.  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  She has a moderate hiatal hernia.  I reviewed this CT scan with her and answered her questions.  The lesion is not highly suspicious for malignancy but I agree with repeating a CT scan in 3-6 months.  Her CA 125 was down last year to 32.5.  INTERVAL HISTORY:  I have reviewed her chart and materials related to her cancer extensively and collaborated history with the patient. Summary of oncologic history is as follows: Oncology History   No history exists.    Stacey Bowman is here after not being seen in the clinic since August 2019.   Her  appetite is good, and she has gained/lost _ pounds since her last visit.  She denies fever, chills or other signs of infection.  She denies nausea, vomiting, bowel issues, or abdominal pain.  She denies sore throat, cough, dyspnea, or chest pain.  HISTORY:   Allergies: No Known Allergies  Current Medications: Current Outpatient Medications  Medication Sig Dispense Refill   albuterol (VENTOLIN HFA) 108 (90 Base) MCG/ACT inhaler Inhale 2 puffs into the lungs every 6 (six) hours as needed for wheezing or shortness of breath.     ammonium lactate  (LAC-HYDRIN) 12 % lotion Apply 1 application topically 3 (three) times a week.     apixaban (ELIQUIS) 5 MG TABS tablet Take 5 mg by mouth 2 (two) times daily.     aspirin 81 MG tablet Take 1 tablet (81 mg total) by mouth daily. 30 tablet    benzonatate (TESSALON) 100 MG capsule Take 100 mg by mouth 3 (three) times daily. 10a 4p and bedtime     CARAFATE 1 GM/10ML suspension Take 2 g by mouth daily.      celecoxib (CELEBREX) 200 MG capsule Take 200 mg by mouth 2 (two) times daily.     cloNIDine (CATAPRES) 0.1 MG tablet Take 0.1 mg by mouth at bedtime.     clotrimazole (MYCELEX) 10 MG troche Take 10 mg by mouth every 4 (four) hours as needed (thrush).     cyclobenzaprine (FLEXERIL) 10 MG tablet Take 10 mg by mouth at bedtime as needed for muscle spasms. May also take  3 x a week if needed.     digoxin (LANOXIN) 0.125 MG tablet TAKE 1 TABLET BY MOUTH EVERY DAY 30 tablet 0   diltiazem (CARDIZEM SR) 120 MG 12 hr capsule TAKE 1 CAPSULE (120 MG TOTAL) BY MOUTH 2 (TWO) TIMES DAILY. 180 capsule 3   diphenhydrAMINE (BENADRYL) 25 mg capsule Take 25 mg by mouth in the morning and at bedtime.     DULoxetine (CYMBALTA) 30 MG capsule Take 90 mg by mouth daily.     fexofenadine (ALLEGRA) 180 MG tablet Take 180 mg by mouth daily.     fluticasone (FLONASE) 50 MCG/ACT nasal spray Place 1 spray into both nostrils daily as needed for allergies or rhinitis.     fluticasone-salmeterol (ADVAIR) 250-50 MCG/ACT AEPB Inhale 1 puff into the lungs in the morning and at bedtime.     gabapentin (NEURONTIN) 300 MG capsule Take 600-900 mg by mouth See admin instructions. 600 mg at 10a, 2p and 6p and 900 mg at bedtime     levothyroxine (SYNTHROID) 137 MCG tablet Take 137 mcg by mouth daily before breakfast.     losartan (COZAAR) 25 MG tablet Take 25 mg by mouth every evening.     losartan (COZAAR) 50 MG tablet TAKE 1 TABLET BY MOUTH EVERY DAY 30 tablet 0   montelukast (SINGULAIR) 10 MG tablet Take 10 mg by mouth at bedtime.       nitroGLYCERIN (NITROSTAT) 0.4 MG SL tablet Place 0.4 mg under the tongue every 5 (five) minutes as needed for chest pain.     nystatin cream (MYCOSTATIN) Apply 1 application topically 3 (three) times daily as needed for dry skin.     omeprazole (PRILOSEC) 20 MG capsule Take 20 mg by mouth every evening.     promethazine (PHENERGAN) 25 MG suppository Place 25 mg rectally every 6 (six) hours as needed for nausea or vomiting.     traMADol (ULTRAM) 50 MG tablet Take 50 mg by mouth every 6 (six) hours as needed for severe pain.     Vitamin D, Ergocalciferol, (DRISDOL) 50000 UNITS CAPS capsule Take 50,000 Units by mouth every 7 (seven) days. Wednesday     Current Facility-Administered Medications  Medication Dose Route Frequency Provider Last Rate Last Admin   lidocaine (PF) (XYLOCAINE) 1 % injection 0.3 mL  0.3 mL Other Once Magnus Sinning, MD        REVIEW OF SYSTEMS:  Review of Systems - Oncology    VITALS:  There were no vitals taken for this visit.  Wt Readings from Last 3 Encounters:  02/17/21 130 lb (59 kg)  01/17/21 130 lb (59 kg)  12/22/19 130 lb (59 kg)    There is no height or weight on file to calculate BMI.  Performance status (ECOG): {CHL ONC Q3448304  PHYSICAL EXAM:  Physical Exam   LABS:   CBC Latest Ref Rng & Units 03/23/2021 10/09/2013 10/08/2013  WBC - 8.0 11.1(H) 15.8(H)  Hemoglobin 12.0 - 16.0 11.8(A) 7.8(L) 8.6(L)  Hematocrit 36 - 46 37 23.5(L) 26.7(L)  Platelets 150 - 399 319 200 230   CMP Latest Ref Rng & Units 03/23/2021 10/07/2013 09/30/2013  Glucose 70 - 99 mg/dL - 132(H) 110(H)  BUN 4 - 21 14 19 21   Creatinine 0.5 - 1.1 0.6 0.60 0.58  Sodium 137 - 147 139 140 141  Potassium 3.4 - 5.3 3.5 3.9 4.1  Chloride 99 - 108 100 101 102  CO2 13 - 22 28(A) 26 24  Calcium 8.7 -  10.7 9.0 7.8(L) 8.9  Total Protein 6.0 - 8.3 g/dL - - -  Total Bilirubin 0.3 - 1.2 mg/dL - - -  Alkaline Phos 25 - 125 70 - -  AST 13 - 35 33 - -  ALT 7 - 35 19 - -     No  results found for: CEA1 / No results found for: CEA1 No results found for: PSA1 No results found for: CAN199 Lab Results  Component Value Date   CAN125 33.0 03/23/2021    No results found for: TOTALPROTELP, ALBUMINELP, A1GS, A2GS, BETS, BETA2SER, GAMS, MSPIKE, SPEI No results found for: TIBC, FERRITIN, IRONPCTSAT No results found for: LDH  STUDIES:  No results found.    I, Rita Ohara, am acting as scribe for Derwood Kaplan, MD  I have reviewed this report as typed by the medical scribe, and it is complete and accurate.

## 2021-08-03 ENCOUNTER — Other Ambulatory Visit: Payer: Self-pay | Admitting: Oncology

## 2021-08-03 DIAGNOSIS — D539 Nutritional anemia, unspecified: Secondary | ICD-10-CM

## 2021-08-03 DIAGNOSIS — C541 Malignant neoplasm of endometrium: Secondary | ICD-10-CM

## 2021-08-04 ENCOUNTER — Ambulatory Visit: Payer: Medicare Other | Admitting: Oncology

## 2021-08-10 DIAGNOSIS — R978 Other abnormal tumor markers: Secondary | ICD-10-CM

## 2021-08-10 DIAGNOSIS — J8410 Pulmonary fibrosis, unspecified: Secondary | ICD-10-CM | POA: Insufficient documentation

## 2021-08-10 HISTORY — DX: Other abnormal tumor markers: R97.8

## 2021-08-16 ENCOUNTER — Other Ambulatory Visit: Payer: Self-pay | Admitting: Cardiovascular Disease

## 2021-08-17 ENCOUNTER — Encounter: Payer: Self-pay | Admitting: Oncology

## 2021-08-17 ENCOUNTER — Ambulatory Visit: Payer: Medicare Other | Admitting: Oncology

## 2021-08-17 ENCOUNTER — Telehealth: Payer: Self-pay

## 2021-08-17 ENCOUNTER — Inpatient Hospital Stay: Payer: Medicare Other | Attending: Oncology | Admitting: Oncology

## 2021-08-17 ENCOUNTER — Other Ambulatory Visit: Payer: Self-pay | Admitting: Oncology

## 2021-08-17 DIAGNOSIS — C541 Malignant neoplasm of endometrium: Secondary | ICD-10-CM | POA: Diagnosis not present

## 2021-08-17 DIAGNOSIS — D509 Iron deficiency anemia, unspecified: Secondary | ICD-10-CM

## 2021-08-17 DIAGNOSIS — Z9189 Other specified personal risk factors, not elsewhere classified: Secondary | ICD-10-CM

## 2021-08-17 DIAGNOSIS — R978 Other abnormal tumor markers: Secondary | ICD-10-CM

## 2021-08-17 DIAGNOSIS — J8410 Pulmonary fibrosis, unspecified: Secondary | ICD-10-CM

## 2021-08-17 DIAGNOSIS — D539 Nutritional anemia, unspecified: Secondary | ICD-10-CM

## 2021-08-17 LAB — BASIC METABOLIC PANEL
BUN: 24 — AB (ref 4–21)
CO2: 30 — AB (ref 13–22)
Chloride: 102 (ref 99–108)
Creatinine: 0.4 — AB (ref 0.5–1.1)
Glucose: 193
Potassium: 4.3 (ref 3.4–5.3)
Sodium: 137 (ref 137–147)

## 2021-08-17 LAB — CBC AND DIFFERENTIAL
HCT: 29 — AB (ref 36–46)
Hemoglobin: 10.5 — AB (ref 12.0–16.0)
Neutrophils Absolute: 3.83
Platelets: 257 (ref 150–399)
WBC: 7.1

## 2021-08-17 LAB — HEPATIC FUNCTION PANEL
ALT: 14 (ref 7–35)
AST: 25 (ref 13–35)
Alkaline Phosphatase: 63 (ref 25–125)
Bilirubin, Total: 0.4

## 2021-08-17 LAB — COMPREHENSIVE METABOLIC PANEL
Albumin: 3.2 — AB (ref 3.5–5.0)
Calcium: 8.9 (ref 8.7–10.7)

## 2021-08-17 LAB — CBC: RBC: 3.34 — AB (ref 3.87–5.11)

## 2021-08-17 NOTE — Telephone Encounter (Signed)
-----   Message from Derwood Kaplan, MD sent at 08/16/2021  6:58 PM EST ----- Regarding: labs I know she had labs elsewhere, do we know where they are?  She wanted something for her anemia but would need to eval with labs in order to know what she needs.  Orders in but cancel if she wishes

## 2021-08-17 NOTE — Progress Notes (Incomplete)
[name]  [address] [place],  [place]  [address] [phone]  Clinic Day:  08/17/2021  Referring physician: Dustin Flock, PA-C  This document serves as a record of services personally performed by Stacey Poisson, MD. It was created on their behalf by Curry,Lauren E, a trained medical scribe. The creation of this record is based on the scribe's personal observations and the provider's statements to them.  ASSESSMENT & PLAN:   1. 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  She still has fluctuation of her CA 125, but it has been even higher than this in the past.  We will continue to monitor this.  2. Bilateral hip replacements.  The interference from this makes the CT of the pelvis very difficult to interpret.  3. Anemia, mild, but slightly worse.  She does have iron deficiency.  4. Stable pulmonary nodule, 4 mm, but now a new one measuring 1.3 cm.  I agree CT thorax should be repeated in 3-6 months.  She will let me know when that is done.  5. 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    I provided *** minutes of face-to-face time during this this encounter and > 50% was spent counseling as documented under my assessment and plan.    Derwood Kaplan, MD [facility] [address] [place] [address] Dept: [phone] Dept Fax: [phone]    CHIEF COMPLAINT:  CC: History of stage IIIC endometrial cancer and iron deficiency  Current Treatment:  Surveillance   HISTORY OF PRESENT ILLNESS:  Stacey Bowman is a 80 y.o. 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 She was treated with total abdominal hysterectomy and bilateral salpingo-oophorectomy by laparoscopic technique.  Pathology revealed a grade 1  endometrial adenocarcinoma with 50% invasion into the myometrium.  2/20 nodes were positive for metastasis.  Estrogen and progesterone receptors were positive.  CA-125 was elevated prior to adjuvant chemotherapy.  A CT scan of the abdomen and pelvis did not reveal evidence of disease.  She received adjuvant chemotherapy with carboplatin and Taxol for 9 cycles at attenuated doses because of poor tolerance. She eventually had an allergic reaction to carboplatin and received weekly Taxol alone for the last part of her therapy.  This was completed in July 2012.  The CA-125 never normalized, but has fluctuated in a very low range.  It had gone up to 45.9 in November of 2015 but was back down to 33.4, then back up to 46.0 in July 2016.    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  She has arthritis and has had injections through the orthopedic surgeon.  An MRI scan shows progressive scoliosis and spondylosis of the lumbar spine with degenerative disc disease and moderate foraminal stenosis at L2-L3, L3-4 and fairly severe at L4-5.  She also has significant osteoarthritis, and has had bilateral hip replacement.  She also had a CT of the chest in December of 2016 which reveals a 5 mm left upper lobe nodule which was new from prior exams and she had repeat CT's of the chest in August of 2017 and February of 2018, which were stable.  She also has severe emphysema and a moderate hiatal hernia.  She finally had a  bone density scan in February of 2017, which was normal.  She has had frequent urinary tract infections.  She has chronic pain of her lower back with standing or walking.  She has occasional chest pains and nitroglycerin does help.  Her dyspnea has worsened and there is a question of pulmonary fibrosis.  Dr. Gardiner Rhyme is following serial scans to monitor her pulmonary nodule.  She had one in January 2019 which showed resolution of the nodule in the left upper  lobe but she still had a chronic calcified granuloma of the right lower lobe.  A repeat scan in July revealed a stable 5 mm left lower lobe pulmonary nodule and stable calcified bilateral lower lobe granulomas with severe centrilobular emphysema.  However she had a new 1.3 cm left upper lobe nodule in the anterior medial region.  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  She has a moderate hiatal hernia. The lesion is not highly suspicious for malignancy but I agreed with repeating a CT scan in 3-6 months.  Her CA 125 was down last year to 32.5.  INTERVAL HISTORY:  I have reviewed her chart and materials related to her cancer extensively and collaborated history with the patient. Summary of oncologic history is as follows: Oncology History  Endometrial ca [facility])  01/11/2010 Cancer Staging   Staging form: Corpus Uteri - Carcinoma, AJCC 7th Edition - Clinical stage from 01/11/2010: FIGO Stage IIIC, calculated as Stage IIIC1 (T1b, N1, M0) - Signed by Derwood Kaplan, MD on 08/10/2021 Staged by: Managing physician Diagnostic confirmation: Positive histology Specimen type: Excision Histopathologic type: Adenocarcinoma, NOS Stage prefix: Initial diagnosis Histologic grade (G): G1 Lymph-vascular invasion (LVI): LVI not present (absent)/not identified Residual tumor (R): R0 - None Peritoneal cytology results: Negative Pelvic nodal status: Positive Number of pelvic nodes positive from dissection: 2 Number of pelvic nodes examined during dissection: 20 Para-aortic status: Negative Prognostic indicators: 50% invasion of myometrium Stage used in treatment planning: Yes National guidelines used in treatment planning: Yes Type of national guideline used in treatment planning: NCCN Staging comments: Had carboplatin/taxol x 9 cycles    07/15/2013 Initial Diagnosis   Endometrial ca [facility])     [name] is here after not  being seen in the clinic since August 2019.   Her  appetite is good, and she has gained/lost _ pounds since her last visit.  She denies fever, chills or other signs of infection.  She denies nausea, vomiting, bowel issues, or abdominal pain.  She denies sore throat, cough, dyspnea, or chest pain.  HISTORY:   Allergies: No Known Allergies  Current Medications: Current Outpatient Medications  Medication Sig Dispense Refill   albuterol (VENTOLIN HFA) 108 (90 Base) MCG/ACT inhaler Inhale 2 puffs into the lungs every 6 (six) hours as needed for wheezing or shortness of breath.     ammonium lactate (LAC-HYDRIN) 12 % lotion Apply 1 application topically 3 (three) times a week.     apixaban (ELIQUIS) 5 MG TABS tablet Take 5 mg by mouth 2 (two) times daily.     aspirin 81 MG tablet Take 1 tablet (81 mg total) by mouth daily. 30 tablet    benzonatate (TESSALON) 100 MG capsule Take 100 mg by mouth 3 (three) times daily. 10a 4p and bedtime     CARAFATE 1 GM/10ML suspension Take 2 g by mouth daily.      celecoxib (CELEBREX) 200 MG capsule Take 200 mg by  mouth 2 (two) times daily.     cloNIDine (CATAPRES) 0.1 MG tablet Take 0.1 mg by mouth at bedtime.     clotrimazole (MYCELEX) 10 MG troche Take 10 mg by mouth every 4 (four) hours as needed (thrush).     cyclobenzaprine (FLEXERIL) 10 MG tablet Take 10 mg by mouth at bedtime as needed for muscle spasms. May also take 3 x a week if needed.     digoxin (LANOXIN) 0.125 MG tablet TAKE 1 TABLET BY MOUTH EVERY DAY 30 tablet 0   diltiazem (CARDIZEM SR) 120 MG 12 hr capsule TAKE 1 CAPSULE (120 MG TOTAL) BY MOUTH 2 (TWO) TIMES DAILY. 180 capsule 3   diphenhydrAMINE (BENADRYL) 25 mg capsule Take 25 mg by mouth in the morning and at bedtime.     DULoxetine (CYMBALTA) 30 MG capsule Take 90 mg by mouth daily.     fexofenadine (ALLEGRA) 180 MG tablet Take 180 mg by mouth daily.     fluticasone (FLONASE) 50 MCG/ACT nasal spray Place 1 spray into both nostrils daily as  needed for allergies or rhinitis.     fluticasone-salmeterol (ADVAIR) 250-50 MCG/ACT AEPB Inhale 1 puff into the lungs in the morning and at bedtime.     gabapentin (NEURONTIN) 300 MG capsule Take 600-900 mg by mouth See admin instructions. 600 mg at 10a, 2p and 6p and 900 mg at bedtime     levothyroxine (SYNTHROID) 137 MCG tablet Take 137 mcg by mouth daily before breakfast.     losartan (COZAAR) 25 MG tablet Take 25 mg by mouth every evening.     losartan (COZAAR) 50 MG tablet TAKE 1 TABLET BY MOUTH EVERY DAY 30 tablet 0   montelukast (SINGULAIR) 10 MG tablet Take 10 mg by mouth at bedtime.      nitroGLYCERIN (NITROSTAT) 0.4 MG SL tablet Place 0.4 mg under the tongue every 5 (five) minutes as needed for chest pain.     nystatin cream (MYCOSTATIN) Apply 1 application topically 3 (three) times daily as needed for dry skin.     omeprazole (PRILOSEC) 20 MG capsule Take 20 mg by mouth every evening.     promethazine (PHENERGAN) 25 MG suppository Place 25 mg rectally every 6 (six) hours as needed for nausea or vomiting.     traMADol (ULTRAM) 50 MG tablet Take 50 mg by mouth every 6 (six) hours as needed for severe pain.     Vitamin D, Ergocalciferol, (DRISDOL) 50000 UNITS CAPS capsule Take 50,000 Units by mouth every 7 (seven) days. Wednesday     Current Facility-Administered Medications  Medication Dose Route Frequency Provider Last Rate Last Admin   lidocaine (PF) (XYLOCAINE) 1 % injection 0.3 mL  0.3 mL Other Once Magnus Sinning, MD        REVIEW OF SYSTEMS:  Review of Systems - Oncology    VITALS:  There were no vitals taken for this visit.  Wt Readings from Last 3 Encounters:  02/17/21 130 lb (59 kg)  01/17/21 130 lb (59 kg)  12/22/19 130 lb (59 kg)    There is no height or weight on file to calculate BMI.  Performance status (ECOG): {CHL ONC Q3448304  PHYSICAL EXAM:  Physical Exam   LABS:   CBC Latest Ref Rng & Units 03/23/2021 10/09/2013 10/08/2013  WBC - 8.0 11.1(H)  15.8(H)  Hemoglobin 12.0 - 16.0 11.8(A) 7.8(L) 8.6(L)  Hematocrit 36 - 46 37 23.5(L) 26.7(L)  Platelets 150 - 399 319 200 230   CMP Latest Ref  Rng & Units 03/23/2021 10/07/2013 09/30/2013  Glucose 70 - 99 mg/dL - 132(H) 110(H)  BUN 4 - 21 14 19 21   Creatinine 0.5 - 1.1 0.6 0.60 0.58  Sodium 137 - 147 139 140 141  Potassium 3.4 - 5.3 3.5 3.9 4.1  Chloride 99 - 108 100 101 102  CO2 13 - 22 28(A) 26 24  Calcium 8.7 - 10.7 9.0 7.8(L) 8.9  Total Protein 6.0 - 8.3 g/dL - - -  Total Bilirubin 0.3 - 1.2 mg/dL - - -  Alkaline Phos 25 - 125 70 - -  AST 13 - 35 33 - -  ALT 7 - 35 19 - -     No results found for: CEA1 / No results found for: CEA1 No results found for: PSA1 No results found for: CAN199 Lab Results  Component Value Date   CAN125 33.0 03/23/2021    No results found for: TOTALPROTELP, ALBUMINELP, A1GS, A2GS, BETS, BETA2SER, GAMS, MSPIKE, SPEI No results found for: TIBC, FERRITIN, IRONPCTSAT No results found for: LDH  STUDIES:  No results found.   I, Rita Ohara, am acting as scribe for Derwood Kaplan, MD  I have reviewed this report as typed by the medical scribe, and it is complete and accurate.

## 2021-08-17 NOTE — Progress Notes (Signed)
[facility]  [address] [place],  [place]  [address] [phone]  I connected with Stacey Bowman on 08/17/2021 at 4:53 PM EDT by telephone visit and verified that I am speaking with the correct person using two identifiers.   I discussed the limitations, risks, security and privacy concerns of performing an evaluation and management service by telemedicine and the availability of in-person appointments. I also discussed with the patient that there may be a patient responsible charge related to this service. The patient expressed understanding and agreed to proceed.   Other persons participating in the visit and their role in the encounter:  None Patient's location:  Home Provider's location:  Office  Clinic Day:  08/17/2021  Referring physician: Dustin Flock, PA-C  This document serves as a record of services personally performed by Hosie Poisson, MD. It was created on their behalf by Curry,Lauren E, a trained medical scribe. The creation of this record is based on the scribe's personal observations and the provider's statements to them.  ASSESSMENT & PLAN:   1. 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  She still has fluctuation of her CA 125, but it has been even higher than this in the past.   2. Suspicious of rectal cancer with changes in the stool and probable blood loss.   3. Bilateral hip replacements.  The interference from this makes the CT of the pelvis very difficult to interpret.  4. Anemia, normochromic and normocytic.  I suspect this is iron deficiency from blood loss, but would also like to check B12 and folate.  5. Stable pulmonary nodule, 4 mm, but now a new one measuring 1.3 cm.    6. 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  I recommended labs to repeat a CBC and check B12, folate and iron studies. She wants  this drawn through her port and requests that I send a request to outpatient center at [facility]. If we confirm iron deficiency, she cannot tolerate oral iron and would need IV. She prefers that that be administered that that be administered at the outpatient center rather than the infusion center. She is agreeable to colonoscopy, but likely may require surgery, so I will refer her to Dr. Jerel Shepherd. I will call her on the lab results when available. She realizes that this could possibly represent metastatic endometrial cancer, but that would be unlikely given the number of years that have passed. I have recommended that I see her back for follow up after these things are done. She states that she absolutely refuses to consider radiation therapy. She understands and agrees with this plan of care.  I provided 33 minutes of face-to-face time during this this encounter and > 50% was spent counseling as documented under my assessment and plan.    Derwood Kaplan, MD [place] [address] [place] [address] Dept: [phone] Dept Fax: [phone]    CHIEF COMPLAINT:  CC: History of stage IIIC endometrial cancer and iron deficiency  Current Treatment:  Surveillance   HISTORY OF PRESENT ILLNESS:  Stacey Bowman is a 80 y.o. 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 She was treated with total abdominal hysterectomy and bilateral salpingo-oophorectomy by laparoscopic technique.  Pathology revealed a grade 1 endometrial adenocarcinoma with 50%  invasion into the myometrium.  2/20 nodes were positive for metastasis.  Estrogen and progesterone receptors were positive.  CA-125 was elevated prior to adjuvant chemotherapy.  A CT scan of the abdomen and pelvis did not reveal evidence of disease.  She received adjuvant chemotherapy with carboplatin and Taxol for 9 cycles at  attenuated doses because of poor tolerance. She eventually had an allergic reaction to carboplatin and received weekly Taxol alone for the last part of her therapy.  This was completed in July 2012.  The CA-125 never normalized, but has fluctuated in a very low range.  It had gone up to 45.9 in November of 2015 but was back down to 33.4 one year ago, then back up to 46.0 in July 2016. Her CA 125 was down to 32.5 in 2018.  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  She has arthritis and has had injections through the orthopedic surgeon.  An MRI scan shows progressive scoliosis and spondylosis of the lumbar spine with degenerative disc disease and moderate foraminal stenosis at L2-L3, L3-4 and fairly severe at L4-5.  She also has significant osteoarthritis, and has had bilateral hip replacement.  She also had a CT of the chest in December of 2016 which reveals a 5 mm left upper lobe nodule which was new from prior exams and she had repeat CT's of the chest in August of 2017 and February of 2018, which were stable.  She also has severe emphysema and a moderate hiatal hernia.  She finally had a bone density scan in February of 2017, which was normal.  She has had frequent urinary tract infections.  She has chronic pain of her lower back with standing or walking.  She has occasional chest pains and nitroglycerin does help.  Her dyspnea has worsened and there is a question of pulmonary fibrosis.  Dr. Gardiner Rhyme is following serial scans to monitor her pulmonary nodule.  She had one in January 2019 which showed resolution of the nodule in the left upper lobe but she still had a chronic calcified granuloma of the right lower lobe.  A repeat scan in July revealed a stable 5 mm left lower lobe pulmonary nodule and stable calcified bilateral lower lobe granulomas with severe centrilobular emphysema.  However she had a new 1.3 cm left upper lobe nodule in the anterior  medial region.  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  She has a moderate hiatal hernia. The lesion is not highly suspicious for malignancy but I agree with repeating a CT scan in 3-6 months.    INTERVAL HISTORY:  I have reviewed her chart and materials related to her cancer extensively and collaborated history with the patient. Summary of oncologic history is as follows: Oncology History  Endometrial ca [facility])  01/11/2010 Cancer Staging   Staging form: Corpus Uteri - Carcinoma, AJCC 7th Edition - Clinical stage from 01/11/2010: FIGO Stage IIIC, calculated as Stage IIIC1 (T1b, N1, M0) - Signed by Derwood Kaplan, MD on 08/10/2021 Staged by: Managing physician Diagnostic confirmation: Positive histology Specimen type: Excision Histopathologic type: Adenocarcinoma, NOS Stage prefix: Initial diagnosis Histologic grade (G): G1 Lymph-vascular invasion (LVI): LVI not present (absent)/not identified Residual tumor (R): R0 - None Peritoneal cytology results: Negative Pelvic nodal status: Positive Number of pelvic nodes positive from dissection: 2 Number of pelvic nodes examined during dissection: 20 Para-aortic status: Negative Prognostic  indicators: 50% invasion of myometrium Stage used in treatment planning: Yes National guidelines used in treatment planning: Yes Type of national guideline used in treatment planning: NCCN Staging comments: Had carboplatin/taxol x 9 cycles    07/15/2013 Initial Diagnosis   Endometrial ca [facility])     Stacey Bowman presents through TELEHEALTH after not being seen in the clinic since August 2019. She started out the conversation that she thinks she has a rectal cancer as she has noted constipation, narrow caliber of stools, and mahogany colored stools. This has been going on for at least 1 year. This has been going on for at least 1 year. She is mainly using fruit in her diet for her  constipation and states that Miralax did not work well for her. She describes weight loss of 30 40 pounds. She complains of severe fatigue and dyspnea with exertion. Years ago she was found to be iron deficient in July 2019 and was briefly placed on oral supplement, but did not tolerate this. She will willing to have a colonoscopy for evaluation, but requests an appointment late in the day as it is difficulty for her to manage and she has been feeling sickly. She was found to be anemia on her labs from November 1st with a hemoglobin of 10.5 with an MCV of 88. White count and platelet count were normal. She wants me to give her something for her anemia and I explained that we need to evaluate it first in order to note the best approach. Her CMP revealed a nonfasting blood sugar of 193 and BUN of 24. Her total protein was low at 5.5 with an albumin of 3.2 and the rest was unremarkable. Her cholesterol was 107. She denies fever, chills or other signs of infection.  She denies nausea, vomiting or abdominal pain.  She denies sore throat, cough or chest pain. She originally had an appointment but requested that this be a Telehealth as she did not feel well enough to come in.  HISTORY:   Allergies: No Known Allergies  Current Medications: Current Outpatient Medications  Medication Sig Dispense Refill   albuterol (VENTOLIN HFA) 108 (90 Base) MCG/ACT inhaler Inhale 2 puffs into the lungs every 6 (six) hours as needed for wheezing or shortness of breath.     ammonium lactate (LAC-HYDRIN) 12 % lotion Apply 1 application topically 3 (three) times a week.     apixaban (ELIQUIS) 5 MG TABS tablet Take 5 mg by mouth 2 (two) times daily.     aspirin 81 MG tablet Take 1 tablet (81 mg total) by mouth daily. 30 tablet    benzonatate (TESSALON) 100 MG capsule Take 100 mg by mouth 3 (three) times daily. 10a 4p and bedtime     CARAFATE 1 GM/10ML suspension Take 2 g by mouth daily.      celecoxib (CELEBREX) 200 MG capsule  Take 200 mg by mouth 2 (two) times daily.     cloNIDine (CATAPRES) 0.1 MG tablet Take 0.1 mg by mouth at bedtime.     clotrimazole (MYCELEX) 10 MG troche Take 10 mg by mouth every 4 (four) hours as needed (thrush).     cyclobenzaprine (FLEXERIL) 10 MG tablet Take 10 mg by mouth at bedtime as needed for muscle spasms. May also take 3 x a week if needed.     digoxin (LANOXIN) 0.125 MG tablet TAKE 1 TABLET BY MOUTH EVERY DAY 30 tablet 0   diltiazem (CARDIZEM SR) 120 MG 12 hr capsule TAKE 1 CAPSULE (120  MG TOTAL) BY MOUTH 2 (TWO) TIMES DAILY. 180 capsule 3   diphenhydrAMINE (BENADRYL) 25 mg capsule Take 25 mg by mouth in the morning and at bedtime.     DULoxetine (CYMBALTA) 30 MG capsule Take 90 mg by mouth daily.     fexofenadine (ALLEGRA) 180 MG tablet Take 180 mg by mouth daily.     fluticasone (FLONASE) 50 MCG/ACT nasal spray Place 1 spray into both nostrils daily as needed for allergies or rhinitis.     fluticasone-salmeterol (ADVAIR) 250-50 MCG/ACT AEPB Inhale 1 puff into the lungs in the morning and at bedtime.     gabapentin (NEURONTIN) 300 MG capsule Take 600-900 mg by mouth See admin instructions. 600 mg at 10a, 2p and 6p and 900 mg at bedtime     levothyroxine (SYNTHROID) 137 MCG tablet Take 137 mcg by mouth daily before breakfast.     losartan (COZAAR) 25 MG tablet Take 25 mg by mouth every evening.     losartan (COZAAR) 50 MG tablet TAKE 1 TABLET BY MOUTH EVERY DAY 30 tablet 0   montelukast (SINGULAIR) 10 MG tablet Take 10 mg by mouth at bedtime.      nitroGLYCERIN (NITROSTAT) 0.4 MG SL tablet Place 0.4 mg under the tongue every 5 (five) minutes as needed for chest pain.     nystatin cream (MYCOSTATIN) Apply 1 application topically 3 (three) times daily as needed for dry skin.     omeprazole (PRILOSEC) 20 MG capsule Take 20 mg by mouth every evening.     promethazine (PHENERGAN) 25 MG suppository Place 25 mg rectally every 6 (six) hours as needed for nausea or vomiting.     traMADol  (ULTRAM) 50 MG tablet Take 50 mg by mouth every 6 (six) hours as needed for severe pain.     Vitamin D, Ergocalciferol, (DRISDOL) 50000 UNITS CAPS capsule Take 50,000 Units by mouth every 7 (seven) days. Wednesday     Current Facility-Administered Medications  Medication Dose Route Frequency Provider Last Rate Last Admin   lidocaine (PF) (XYLOCAINE) 1 % injection 0.3 mL  0.3 mL Other Once Magnus Sinning, MD        REVIEW OF SYSTEMS:  Review of Systems  Constitutional:  Positive for appetite change (poor), fatigue and unexpected weight change (weight loss, 30-40 lbs). Negative for chills and fever.  HENT:  Negative.    Eyes: Negative.   Respiratory:  Positive for shortness of breath (with exertion). Negative for chest tightness, cough, hemoptysis and wheezing.   Cardiovascular: Negative.  Negative for chest pain, leg swelling and palpitations.  Gastrointestinal:  Positive for constipation (with narrow and mahoghany colored stools). Negative for abdominal distention, abdominal pain, blood in stool, diarrhea, nausea, rectal pain and vomiting.  Endocrine: Negative.   Genitourinary: Negative.  Negative for difficulty urinating, dysuria, frequency and hematuria.   Musculoskeletal:  Negative for arthralgias, back pain, flank pain, gait problem and myalgias.  Skin: Negative.   Neurological:  Positive for extremity weakness. Negative for dizziness, gait problem, headaches, light-headedness, numbness, seizures and speech difficulty.  Hematological: Negative.   Psychiatric/Behavioral: Negative.  Negative for depression and sleep disturbance. The patient is not nervous/anxious.      VITALS:  There were no vitals taken for this visit.  Wt Readings from Last 3 Encounters:  02/17/21 130 lb (59 kg)  01/17/21 130 lb (59 kg)  12/22/19 130 lb (59 kg)    There is no height or weight on file to calculate BMI.  Performance status (ECOG): 2 -  Symptomatic, <50% confined to bed  PHYSICAL EXAM:   Physical Exam deferred due to TELEHEALTH visit   LABS:   CBC Latest Ref Rng & Units 07/04/2021 03/23/2021 10/09/2013  WBC - 7.1 8.0 11.1(H)  Hemoglobin 12.0 - 16.0 10.5(A) 11.8(A) 7.8(L)  Hematocrit 36 - 46 29(A) 37 23.5(L)  Platelets 150 - 399 257 319 200   CMP Latest Ref Rng & Units 07/04/2021 03/23/2021 10/07/2013  Glucose 70 - 99 mg/dL - - 132(H)  BUN 4 - 21 24(A) 14 19  Creatinine 0.5 - 1.1 0.4(A) 0.6 0.60  Sodium 137 - 147 137 139 140  Potassium 3.4 - 5.3 4.3 3.5 3.9  Chloride 99 - 108 102 100 101  CO2 13 - 22 30(A) 28(A) 26  Calcium 8.7 - 10.7 8.9 9.0 7.8(L)  Total Protein 6.0 - 8.3 g/dL - - -  Total Bilirubin 0.3 - 1.2 mg/dL - - -  Alkaline Phos 25 - 125 63 70 -  AST 13 - 35 25 33 -  ALT 7 - 35 14 19 -    Lab Results  Component Value Date   CAN125 33.0 03/23/2021    No results found for: TOTALPROTELP, ALBUMINELP, A1GS, A2GS, BETS, BETA2SER, GAMS, MSPIKE, SPEI No results found for: TIBC, FERRITIN, IRONPCTSAT No results found for: LDH  STUDIES:  No results found.    I, Rita Ohara, am acting as scribe for Derwood Kaplan, MD  I have reviewed this report as typed by the medical scribe, and it is complete and accurate.

## 2021-08-17 NOTE — Telephone Encounter (Signed)
Labs printed from [facility], [place] and placed in Dr. Drue Flirt box for her review

## 2021-08-18 ENCOUNTER — Telehealth: Payer: Self-pay

## 2021-08-18 NOTE — Telephone Encounter (Signed)
Referral sent to Dr. Misenheimer's office 

## 2021-08-18 NOTE — Telephone Encounter (Signed)
-----   Message from Derwood Kaplan, MD sent at 08/17/2021  7:06 PM EST ----- Regarding: referral Pls ref Dr. Noberto Retort for colonoscopy. Pt thinks she has rectal cancer, describes 30-40 lb weight loss, narrow caliber stools and mahogany colored stool.  She had uterine cancer many years ago.  She has anemia and we are eval for iron def.  Pls send draft copy of 12/15 note. She requests late afternoon appt.  I did telehealth appt as she felt too ill to come in, so have not examined her.

## 2021-08-23 ENCOUNTER — Telehealth: Payer: Self-pay | Admitting: Oncology

## 2021-08-23 NOTE — Telephone Encounter (Signed)
08/23/21 left msg x2 about lab order sent to [place].

## 2021-09-07 ENCOUNTER — Other Ambulatory Visit: Payer: Self-pay | Admitting: Cardiovascular Disease

## 2021-09-28 ENCOUNTER — Other Ambulatory Visit: Payer: Self-pay

## 2021-09-29 ENCOUNTER — Ambulatory Visit (INDEPENDENT_AMBULATORY_CARE_PROVIDER_SITE_OTHER): Payer: Medicare Other | Admitting: Cardiology

## 2021-09-29 ENCOUNTER — Encounter: Payer: Self-pay | Admitting: Cardiology

## 2021-09-29 ENCOUNTER — Other Ambulatory Visit: Payer: Self-pay

## 2021-09-29 VITALS — BP 116/62 | HR 97 | Ht 62.0 in | Wt 109.2 lb

## 2021-09-29 DIAGNOSIS — I4819 Other persistent atrial fibrillation: Secondary | ICD-10-CM

## 2021-09-29 DIAGNOSIS — J449 Chronic obstructive pulmonary disease, unspecified: Secondary | ICD-10-CM | POA: Insufficient documentation

## 2021-09-29 DIAGNOSIS — I10 Essential (primary) hypertension: Secondary | ICD-10-CM

## 2021-09-29 DIAGNOSIS — J431 Panlobular emphysema: Secondary | ICD-10-CM | POA: Diagnosis not present

## 2021-09-29 DIAGNOSIS — E782 Mixed hyperlipidemia: Secondary | ICD-10-CM | POA: Diagnosis not present

## 2021-09-29 NOTE — Patient Instructions (Signed)

## 2021-09-29 NOTE — Progress Notes (Signed)
Cardiology Office Note:    Date:  09/29/2021   ID:  Stacey COUDRIET, DOB August 07, 1941, MRN [id number]  PCP:  Pcp, No  Cardiologist:  Jenean Lindau, MD   Referring MD: Dustin Flock, PA-C    ASSESSMENT:    1. Primary hypertension   2. Mixed dyslipidemia   3. Persistent atrial fibrillation (HCC)   4. Panlobular emphysema (HCC)    PLAN:    In order of problems listed above:  Primary prevention stressed with the patient.  Importance of compliance with diet medication stressed and she vocalized understanding. Persistent atrial fibrillation:I discussed with the patient atrial fibrillation, disease process. Management and therapy including rate and rhythm control, anticoagulation benefits and potential risks were discussed extensively with the patient. Patient had multiple questions which were answered to patient's satisfaction. Mixed dyslipidemia: Diet was emphasized.  Lifestyle modification urged lipids were reviewed and she is on statin therapy followed by primary care. Essential hypertension: Stable blood pressure.  Lifestyle modification and salt intake issues were discussed and she understands and questions were answered to her satisfaction Patient will be seen in follow-up appointment in 6 months or earlier if the patient has any concerns    Medication Adjustments/Labs and Tests Ordered: Current medicines are reviewed at length with the patient today.  Concerns regarding medicines are outlined above.  Orders Placed This Encounter  Procedures   EKG 12-Lead   No orders of the defined types were placed in this encounter.    No chief complaint on file.    History of Present Illness:    Stacey Bowman is a 81 y.o. female.  Patient is previously unknown to me.  She has transferred here from [place].  She has history of COPD on oxygen, essential hypertension, dyslipidemia and what appears to be permanent atrial fibrillation.  She denies any problems at this time and takes  care of activities of daily living.  No chest pain orthopnea or PND.  She is here to be established and transferred.  At the time of my evaluation, the patient is alert awake oriented and in no distress.  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   GERD (gastroesophageal reflux disease)    takes Omeprazole daily   Headache(784.0)    migraines stopped with menopause   History of blood transfusion    History of central retinal artery occlusion 05/02/2016   History of colon polyps    History of migraine    History of staph infection 2013   HTN (hypertension) 05/02/2016   Iron deficiency anemia 07/15/2013   Joint pain    Joint swelling    Lyme disease 2013   Muscle spasms of lower extremity    takes Flexeril daily as needed   Neuropathy    OCD (obsessive compulsive disorder)    On home  oxygen therapy    Osteoporosis    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[place])    was taking steroids-came off in May 2014   Pulmonary nodule less than 1 cm in diameter with moderate to high risk for malignant neoplasm 08/11/2015   Up to 1.3 cm in July 2022   Short-term memory loss    Shortness of breath    with exertion   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   Unspecified essential hypertension    takes Ramipril,Catapres,and Metoprolol daily   Urinary frequency    Urinary urgency    UTI (lower urinary tract infection) hx of   was on a Sulfa med-placed on it 08-05-13  as precaution for surgery    Past Surgical History:  Procedure Laterality Date   ABDOMINAL HYSTERECTOMY     CHOLECYSTECTOMY     COLONOSCOPY     JOINT REPLACEMENT Left 08/19/13   hip   Porta catheter Right 2011   right arm surgery     fracure repair   right arm surgery     TOTAL ABDOMINAL HYSTERECTOMY W/ BILATERAL SALPINGOOPHORECTOMY     TOTAL HIP ARTHROPLASTY Left 08/18/2013   Procedure: LEFT TOTAL HIP ARTHROPLASTY ANTERIOR APPROACH;  Surgeon: Mcarthur Rossetti, MD;  Location: [place];  Service: Orthopedics;  Laterality: Left;   TOTAL HIP ARTHROPLASTY Right 10/06/2013   DR Ninfa Linden   TOTAL HIP ARTHROPLASTY Right 10/06/2013   Procedure: RIGHT TOTAL HIP ARTHROPLASTY ANTERIOR APPROACH;  Surgeon: Mcarthur Rossetti, MD;  Location: [place];  Service: Orthopedics;  Laterality: Right;   WRIST SURGERY Right    ligament repair    Current Medications: Current Meds  Medication Sig   albuterol (VENTOLIN HFA) 108 (90 Base) MCG/ACT inhaler Inhale 2 puffs into the lungs every 6 (six) hours as needed for wheezing or shortness of breath.   ammonium lactate (LAC-HYDRIN) 12 % lotion Apply 1 application topically 3 (three) times a week.   apixaban (ELIQUIS) 5 MG TABS tablet Take 5 mg by mouth 2 (two) times daily.   aspirin 81 MG tablet Take 1 tablet (81 mg total) by mouth daily.   atorvastatin (LIPITOR) 40 MG tablet Take 40 mg by mouth daily.   benzonatate (TESSALON) 100 MG capsule Take 100 mg by mouth daily.   celecoxib (CELEBREX) 200 MG capsule Take 200 mg by mouth as needed for mild pain.   cloNIDine (CATAPRES) 0.1 MG tablet Take 0.1 mg by mouth 3 (three) times daily.   clotrimazole (MYCELEX) 10 MG troche Take 10 mg by mouth every 4 (four) hours as needed (thrush).   cyclobenzaprine (FLEXERIL) 10 MG tablet Take 10 mg by mouth at bedtime as needed for muscle spasms. May also take 3 x a week if needed.   digoxin (LANOXIN) 0.125 MG tablet TAKE 1 TABLET BY MOUTH EVERY DAY   diltiazem (CARDIZEM  SR) 120 MG 12 hr capsule TAKE 1 CAPSULE (120 MG TOTAL) BY MOUTH 2 (TWO) TIMES DAILY.   diphenhydrAMINE (BENADRYL) 25 mg capsule Take 25 mg by mouth as needed for allergies.   DULoxetine (CYMBALTA) 30 MG capsule Take 90 mg by mouth daily.   fexofenadine (ALLEGRA) 180 MG tablet Take 180 mg by mouth daily.   fluticasone (FLONASE) 50 MCG/ACT nasal spray Place 1 spray into both nostrils daily as needed for allergies or rhinitis.   fluticasone-salmeterol (ADVAIR) 250-50 MCG/ACT AEPB Inhale 1 puff into the lungs as needed (wheezing and shortness of breath).   gabapentin (NEURONTIN) 300 MG capsule Take 600-900 mg by mouth  See admin instructions. 600 mg at 10a, 2p and 6p and 900 mg at bedtime   levothyroxine (SYNTHROID) 125 MCG tablet Take 125 mcg by mouth every morning.   losartan (COZAAR) 25 MG tablet Take 25 mg by mouth 2 (two) times daily.   montelukast (SINGULAIR) 10 MG tablet Take 10 mg by mouth at bedtime.    nitroGLYCERIN (NITROSTAT) 0.4 MG SL tablet Place 0.4 mg under the tongue every 5 (five) minutes as needed for chest pain.   nystatin cream (MYCOSTATIN) Apply 1 application topically 3 (three) times daily as needed for dry skin.   omeprazole (PRILOSEC) 20 MG capsule Take 20 mg by mouth every evening.   promethazine (PHENERGAN) 25 MG suppository Place 25 mg rectally every 6 (six) hours as needed for nausea or vomiting.   traMADol (ULTRAM) 50 MG tablet Take 50 mg by mouth every 6 (six) hours as needed for severe pain.   tretinoin (RETIN-A) 0.1 % cream Apply 1 application topically at bedtime.   Vitamin D, Ergocalciferol, (DRISDOL) 50000 UNITS CAPS capsule Take 50,000 Units by mouth every 7 (seven) days. Wednesday     Allergies:   Patient has no known allergies.   Social History   Socioeconomic History   Marital status: Widowed    Spouse name: Not on file   Number of children: 0   Years of education: Not on file   Highest education level: Not on file  Occupational History   Occupation:  retired  Tobacco Use   Smoking status: Former    Types: Cigarettes    Quit date: 09/04/1995    Years since quitting: 26.0   Smokeless tobacco: Never   Tobacco comments:    quit in 1997  Vaping Use   Vaping Use: Never used  Substance and Sexual Activity   Alcohol use: No    Comment: recovering alcoholic, quit in 8295   Drug use: No   Sexual activity: Not on file    Comment: quit 1997  Other Topics Concern   Not on file  Social History Narrative   Not on file   Social Determinants of Health   Financial Resource Strain: Not on file  Food Insecurity: Not on file  Transportation Needs: Not on file  Physical Activity: Not on file  Stress: Not on file  Social Connections: Not on file     Family History: The patient's family history includes Cervical cancer in her maternal aunt; Diabetes in her father and maternal grandmother; Emphysema in her mother; Heart disease in her maternal grandmother and mother; Lung disease in her brother.  ROS:   Please see the history of present illness.    All other systems reviewed and are negative.  EKGs/Labs/Other Studies Reviewed:    The following studies were reviewed today: EKG reveals atrial fibrillation and nonspecific ST-T changes   Recent Labs: 07/04/2021: ALT 14; BUN 24; Creatinine 0.4; Hemoglobin 10.5; Platelets 257; Potassium 4.3; Sodium 137  Recent Lipid Panel    Component Value Date/Time   CHOL 114 03/23/2021 0000   TRIG 97 03/23/2021 0000   HDL 43 03/23/2021 0000   LDLCALC 52 03/23/2021 0000    Physical Exam:    VS:  BP 116/62    Pulse 97    Ht 5\' 2"  (1.575 m)    Wt 109 lb 3.2 oz (49.5 kg)    SpO2 94%    BMI 19.97 kg/m     Wt Readings from Last 3 Encounters:  09/29/21 109 lb 3.2 oz (49.5  kg)  02/17/21 130 lb (59 kg)  01/17/21 130 lb (59 kg)     GEN: Patient is in no acute distress HEENT: Normal NECK: No JVD; No carotid bruits LYMPHATICS: No lymphadenopathy CARDIAC: Hear sounds regular, 2/6 systolic murmur at the  apex. RESPIRATORY:  Clear to auscultation without rales, wheezing or rhonchi  ABDOMEN: Soft, non-tender, non-distended MUSCULOSKELETAL:  No edema; No deformity  SKIN: Warm and dry NEUROLOGIC:  Alert and oriented x 3 PSYCHIATRIC:  Normal affect   Signed, Jenean Lindau, MD  09/29/2021 5:03 PM    [facility]

## 2021-10-02 ENCOUNTER — Telehealth: Payer: Self-pay

## 2021-10-02 NOTE — Telephone Encounter (Signed)
Patient wants to know if we can add a thyroid test on to her blood work that will be ordered. Patient wants her blood work done in the outpatient center. If possible she wants them to access her port for her blood work.

## 2021-10-03 ENCOUNTER — Other Ambulatory Visit: Payer: Self-pay | Admitting: Oncology

## 2021-10-03 DIAGNOSIS — R5382 Chronic fatigue, unspecified: Secondary | ICD-10-CM

## 2021-10-28 ENCOUNTER — Other Ambulatory Visit: Payer: Self-pay | Admitting: Cardiovascular Disease

## 2021-11-07 ENCOUNTER — Telehealth: Payer: Self-pay

## 2021-11-07 NOTE — Telephone Encounter (Signed)
? ?  Pre-operative Risk Assessment  ?  ?Patient Name: Stacey Bowman  ?DOB: 01-17-1941 ?MRN: [id number]  ? ?  ? ?Request for Surgical Clearance   ? ?Procedure:   Colonscopy ? ?Date of Surgery:  Clearance TBD                              ?   ?Surgeon:  Dr. Jerel Shepherd ?Surgeon's Group or Practice Name:  [facility] ?Phone number:  [phone] ?Fax number:  [phone] ?  ?Type of Clearance Requested:   ?- Pharmacy:  Hold Aspirin and Eliquis prior to procedure ?  ?Type of Anesthesia:   Propofol ?  ?Additional requests/questions:   ? ?Signed, ?Skye Plamondon Tressa Busman   ?11/07/2021, 4:52 PM  ? ?

## 2021-11-07 NOTE — Telephone Encounter (Signed)
Patient Name: Stacey Bowman  ?DOB: Feb 06, 1941 ?MRN: [id number]  ?  ? ?Request for patient to hold aspirin for upcoming colonoscopy. Patient has history of aortic atherosclerosis and carotid stenosis.  ?Will route to Dr. Geraldo Pitter for agreement to hold aspirin. ? ?Patient has a history of persistent atrial fibrillation on chronic anticoagulation.  ?Will route to pharmacy for guidelines for holding Eliquis. ? ?Please route responses to p cv div preop ?

## 2021-11-08 ENCOUNTER — Telehealth: Payer: Self-pay | Admitting: Nurse Practitioner

## 2021-11-08 NOTE — Telephone Encounter (Deleted)
Patient with diagnosis of A Fib on eliquis for anticoagulation.   ? ?Procedure: colonoscopy ?Date of procedure: TBD ? ? ?CHA2DS2-VASc Score = 5  ?This indicates a 7.2% annual risk of stroke. ?The patient's score is based upon: ?CHF History: 0 ?HTN History: 1 ?Diabetes History: 0 ?Stroke History: 0 ?Vascular Disease History: 1 ?Age Score: 2 ?Gender Score: 1 ?  ? ?CrCl 89 mL/min ?Platelet count 257K ? ?Per office protocol, patient can hold Eliquis for 1 days prior to procedure.   ?

## 2021-11-08 NOTE — Telephone Encounter (Signed)
Called patient and advised her of the change in dosage after 12/08/21. She is unsure as to when she will need a refill but will call back to inform us when to send a new Rx to her pharmacy.  ?

## 2021-11-08 NOTE — Telephone Encounter (Signed)
Patient's chart reviewed for upcoming colonoscopy. Per Stacey Bowman, RPH of note, she will qualify for a dose decrease of her Eliquis to 2.'5mg'$  BID in another month when she turns 81 since her weight is also < 60kg. ? ?Routing to Dr. Geraldo Pitter and his primary nurse for follow-up in 1 month.  ?

## 2021-11-08 NOTE — Telephone Encounter (Signed)
Patient with diagnosis of afib on Eliquis for anticoagulation.   ? ?Procedure: colonoscopy ?Date of procedure: TBD ? ?CHA2DS2-VASc Score = 5  ?This indicates a 7.2% annual risk of stroke. ?The patient's score is based upon: ?CHF History: 0 ?HTN History: 1 ?Diabetes History: 0 ?Stroke History: 0 ?Vascular Disease History: 1 ?Age Score: 2 ?Gender Score: 1 ?  ?Also has hx of retinal artery occlusion in 2017. ? ?CrCl 5m/min due to low SCr of 0.4 ?Platelet count 257K ? ?Per office protocol, patient can hold Eliquis for 1-2 days prior to procedure.  Of note, she will qualify for a dose decrease of her Eliquis to 2.'5mg'$  BID in another month when she turns 81 since her weight is also < 60kg. ?

## 2021-11-08 NOTE — Telephone Encounter (Signed)
? ?  Primary Cardiologist: Jenean Lindau, MD ? ?Chart reviewed as part of pre-operative protocol coverage for Stacey Bowman.  Per Dr. Geraldo Pitter, patient may hold aspirin for upcoming colonoscopy.  ? ?Per clinical pharmacist, guidelines for holding Eliquis are as follows: ? ?Patient with diagnosis of afib on Eliquis for anticoagulation.   ?  ?Procedure: colonoscopy ?Date of procedure: TBD ?  ?CHA2DS2-VASc Score = 5  ?This indicates a 7.2% annual risk of stroke. ?The patient's score is based upon: ?CHF History: 0 ?HTN History: 1 ?Diabetes History: 0 ?Stroke History: 0 ?Vascular Disease History: 1 ?Age Score: 2 ?Gender Score: 1 ?  ?Also has hx of retinal artery occlusion in 2017. ?  ?CrCl 9m/min due to low SCr of 0.4 ?Platelet count 257K ?  ?Per office protocol, patient can hold Eliquis for 1-2 days prior to procedure.  Of note, Stacey Bowman will qualify for a dose decrease of her Eliquis to 2.'5mg'$  BID in another month when Stacey Bowman turns 81 since her weight is also < 60kg. ? ?I will route this recommendation to the requesting party via Epic fax function and remove from pre-op pool. ? ?Please call with questions. ? ?MEmmaline Life NP-C ? ?  ?11/08/2021, 2:59 PM ?[facility]?[id number] ?Office [phone] [phone] ? ?

## 2021-12-05 ENCOUNTER — Other Ambulatory Visit: Payer: Self-pay | Admitting: Cardiovascular Disease

## 2022-03-30 ENCOUNTER — Ambulatory Visit: Payer: Medicare Other | Admitting: Cardiology

## 2022-06-26 ENCOUNTER — Other Ambulatory Visit: Payer: Self-pay | Admitting: Cardiology

## 2022-08-01 ENCOUNTER — Other Ambulatory Visit: Payer: Self-pay | Admitting: Oncology

## 2022-08-01 ENCOUNTER — Telehealth: Payer: Self-pay

## 2022-08-01 DIAGNOSIS — C541 Malignant neoplasm of endometrium: Secondary | ICD-10-CM

## 2022-08-01 NOTE — Telephone Encounter (Signed)
I spoke with pt. She states she hasn't noticed any side effects from her infusion yesterday.She denies N/V, fever, skin rash/itching, SOB, & diarrhea. Pt instructed to call us if she develops temp of 100.4 or higher, day or night. She verbalized understanding.

## 2022-08-06 ENCOUNTER — Other Ambulatory Visit: Payer: Medicare Other

## 2022-08-07 ENCOUNTER — Inpatient Hospital Stay: Payer: Medicare Other

## 2022-08-08 ENCOUNTER — Inpatient Hospital Stay: Payer: Medicare Other | Attending: Oncology

## 2022-08-08 ENCOUNTER — Other Ambulatory Visit: Payer: Self-pay | Admitting: Oncology

## 2022-08-08 DIAGNOSIS — E039 Hypothyroidism, unspecified: Secondary | ICD-10-CM | POA: Insufficient documentation

## 2022-08-08 DIAGNOSIS — Z79890 Hormone replacement therapy (postmenopausal): Secondary | ICD-10-CM | POA: Diagnosis not present

## 2022-08-08 DIAGNOSIS — E611 Iron deficiency: Secondary | ICD-10-CM | POA: Diagnosis present

## 2022-08-08 DIAGNOSIS — Z96643 Presence of artificial hip joint, bilateral: Secondary | ICD-10-CM | POA: Diagnosis not present

## 2022-08-08 DIAGNOSIS — J8410 Pulmonary fibrosis, unspecified: Secondary | ICD-10-CM | POA: Insufficient documentation

## 2022-08-08 DIAGNOSIS — R971 Elevated cancer antigen 125 [CA 125]: Secondary | ICD-10-CM | POA: Diagnosis not present

## 2022-08-08 DIAGNOSIS — R911 Solitary pulmonary nodule: Secondary | ICD-10-CM | POA: Insufficient documentation

## 2022-08-08 DIAGNOSIS — E876 Hypokalemia: Secondary | ICD-10-CM | POA: Diagnosis not present

## 2022-08-08 DIAGNOSIS — D539 Nutritional anemia, unspecified: Secondary | ICD-10-CM

## 2022-08-08 DIAGNOSIS — D649 Anemia, unspecified: Secondary | ICD-10-CM | POA: Diagnosis not present

## 2022-08-08 DIAGNOSIS — C541 Malignant neoplasm of endometrium: Secondary | ICD-10-CM | POA: Diagnosis present

## 2022-08-08 DIAGNOSIS — R5382 Chronic fatigue, unspecified: Secondary | ICD-10-CM

## 2022-08-08 DIAGNOSIS — Z9981 Dependence on supplemental oxygen: Secondary | ICD-10-CM | POA: Insufficient documentation

## 2022-08-08 MED ORDER — HEPARIN SOD (PORK) LOCK FLUSH 100 UNIT/ML IV SOLN
500.0000 [IU] | Freq: Once | INTRAVENOUS | Status: AC | PRN
  Administered 2022-08-08: 500 [IU]

## 2022-08-08 MED ORDER — SODIUM CHLORIDE 0.9% FLUSH
10.0000 mL | INTRAVENOUS | Status: DC | PRN
  Administered 2022-08-08: 10 mL

## 2022-08-09 ENCOUNTER — Telehealth: Payer: Self-pay

## 2022-08-09 ENCOUNTER — Other Ambulatory Visit: Payer: Self-pay

## 2022-08-09 DIAGNOSIS — E039 Hypothyroidism, unspecified: Secondary | ICD-10-CM

## 2022-08-09 DIAGNOSIS — E611 Iron deficiency: Secondary | ICD-10-CM | POA: Diagnosis not present

## 2022-08-09 LAB — CMP (CANCER CENTER ONLY)
ALT: 18 U/L (ref 0–44)
AST: 24 U/L (ref 15–41)
Albumin: 3.9 g/dL (ref 3.5–5.0)
Alkaline Phosphatase: 72 U/L (ref 38–126)
Anion gap: 11 (ref 5–15)
BUN: 14 mg/dL (ref 8–23)
CO2: 29 mmol/L (ref 22–32)
Calcium: 9.2 mg/dL (ref 8.9–10.3)
Chloride: 102 mmol/L (ref 98–111)
Creatinine: 0.63 mg/dL (ref 0.44–1.00)
GFR, Estimated: >60 mL/min (ref >60)
Glucose, Bld: 125 mg/dL — ABNORMAL HIGH (ref 70–99)
Potassium: 3.2 mmol/L — ABNORMAL LOW (ref 3.5–5.1)
Sodium: 142 mmol/L (ref 135–145)
Total Bilirubin: 0.9 mg/dL (ref 0.3–1.2)
Total Protein: 7.1 g/dL (ref 6.5–8.1)

## 2022-08-09 LAB — IRON AND TIBC
Iron: 68 ug/dL (ref 28–170)
Saturation Ratios: 14 % (ref 10.4–31.8)
TIBC: 505 ug/dL — ABNORMAL HIGH (ref 250–450)
UIBC: 437 ug/dL

## 2022-08-09 LAB — CBC WITH DIFFERENTIAL (CANCER CENTER ONLY)
Abs Immature Granulocytes: 0.02 10*3/uL (ref 0.00–0.07)
Basophils Absolute: 0 10*3/uL (ref 0.0–0.1)
Basophils Relative: 1 %
Eosinophils Absolute: 0.1 10*3/uL (ref 0.0–0.5)
Eosinophils Relative: 2 %
HCT: 38.2 % (ref 36.0–46.0)
Hemoglobin: 11.9 g/dL — ABNORMAL LOW (ref 12.0–15.0)
Immature Granulocytes: 0 %
Lymphocytes Relative: 40 %
Lymphs Abs: 2.4 10*3/uL (ref 0.7–4.0)
MCH: 30.4 pg (ref 26.0–34.0)
MCHC: 31.2 g/dL (ref 30.0–36.0)
MCV: 97.7 fL (ref 80.0–100.0)
Monocytes Absolute: 0.3 10*3/uL (ref 0.1–1.0)
Monocytes Relative: 4 %
Neutro Abs: 3.1 10*3/uL (ref 1.7–7.7)
Neutrophils Relative %: 53 %
Platelet Count: 290 10*3/uL (ref 150–400)
RBC: 3.91 MIL/uL (ref 3.87–5.11)
RDW: 13.8 % (ref 11.5–15.5)
WBC Count: 5.9 10*3/uL (ref 4.0–10.5)
nRBC: 0 % (ref 0.0–0.2)

## 2022-08-09 LAB — VITAMIN B12: Vitamin B-12: 224 pg/mL (ref 180–914)

## 2022-08-09 LAB — FERRITIN: Ferritin: 19 ng/mL (ref 11–307)

## 2022-08-09 LAB — FOLATE: Folate: 10.5 ng/mL (ref >5.9)

## 2022-08-09 LAB — TSH: TSH: <0.01 u[IU]/mL — ABNORMAL LOW (ref 0.350–4.500)

## 2022-08-09 NOTE — Progress Notes (Signed)
[facility]  [address] [place],  [place]  [address] [phone]  I Clinic Day:  08/13/22  Referring physician: No ref. provider found    ASSESSMENT & PLAN:   1. 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  She still has fluctuation of her CA 125, but it has been even higher than this in the past. It is up to 52 this week.  2. Hypokalemia. I will order 10 meq to take po BID and encourage her to increase potassium in her diet.  3. Bilateral hip replacements.  The interference from this makes the CT of the pelvis very difficult to interpret.  4. Anemia, mild and normochromic and normocytic.  I suspect this is iron deficiency with a ferritin of 19 and high TIBC of 505. Her B12 level is low normal.  5. Stable pulmonary nodule, 4 mm, but a new one measuring 1.3 cm last year.    6. 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  I reviewed her labs in detail and gave her a copy. I ordered potassium 10 meq BID and encouraged her to increase this in her diet. I will also recommend an increase in iron in her diet and we discussed what foods to include. Her B12 is low normal and will need to be rechecked next year. Her vitamin D level is pending and I can let her know when available.Her thyroid supplement may be too high but she tells me the dose was just decreased to 112 mcg 2 weeks ago so her PCP can follow up on this.  She has had severe weight loss, so I have recommended that that she have a CT scan of chest, abdomen and pelvis, and will get that ordered and call her with the results.  She understands and agrees with this plan of care.  I provided 30 minutes of face-to-face time during this this encounter and > 50% was spent counseling as documented under my assessment and plan.    Derwood Kaplan, MD [facility] [address] [place] [address] Dept: [phone] Dept Fax: [phone]    CHIEF COMPLAINT:  CC: History of stage IIIC endometrial cancer and iron deficiency  Current Treatment:  Surveillance   HISTORY OF PRESENT ILLNESS:  Stacey Bowman is a 81 y.o. 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 She was treated with total abdominal hysterectomy and bilateral salpingo-oophorectomy by laparoscopic technique.  Pathology revealed a grade 1 endometrial adenocarcinoma with 50% invasion into the myometrium.  2/20 nodes were positive for metastasis.  Estrogen and progesterone receptors were positive.  CA-125 was elevated prior to adjuvant chemotherapy.  A CT scan of the abdomen and pelvis did not reveal evidence of disease.  She received adjuvant chemotherapy with carboplatin and Taxol for 9 cycles at attenuated doses because of poor tolerance. She eventually had an allergic reaction to carboplatin and received weekly Taxol alone for the last part of her therapy.  This was completed in July 2012.  The CA-125 never normalized, but has fluctuated in a very low range.  It had gone up to 45.9 in November of 2015 but was back down to 33.4 one year ago, then back up to 46.0 in July 2016. Her CA 125 was down to 32.5 in 2018.  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  She has arthritis and has had injections through the orthopedic surgeon.  An MRI scan shows progressive scoliosis and spondylosis of the lumbar spine with degenerative disc disease and moderate foraminal stenosis at L2-L3, L3-4 and fairly severe at L4-5.  She also has significant osteoarthritis, and has had bilateral hip replacement.  She also had a CT of the chest in December of 2016 which reveals a 5 mm left upper lobe nodule which was new from prior exams and she had repeat CT's of the chest in August of  2017 and February of 2018, which were stable.  She also has severe emphysema and a moderate hiatal hernia.  She finally had a bone density scan in February of 2017, which was normal.  She has had frequent urinary tract infections.  She has chronic pain of her lower back with standing or walking.  She has occasional chest pains and nitroglycerin does help.  Her dyspnea has worsened and there is a question of pulmonary fibrosis.  Dr. Gardiner Rhyme is following serial scans to monitor her pulmonary nodule.  She had one in January 2019 which showed resolution of the nodule in the left upper lobe but she still had a chronic calcified granuloma of the right lower lobe.  A repeat scan in July revealed a stable 5 mm left lower lobe pulmonary nodule and stable calcified bilateral lower lobe granulomas with severe centrilobular emphysema.  However she had a new 1.3 cm left upper lobe nodule in the anterior medial region.  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  She has a moderate hiatal hernia. The lesion is not highly suspicious for malignancy but I agree with repeating a CT scan in 3-6 months.    INTERVAL HISTORY:  I have reviewed her chart and materials related to her cancer extensively and collaborated history with the patient. Summary of oncologic history is as follows: Oncology History  Endometrial ca [facility])  01/11/2010 Cancer Staging   Staging form: Corpus Uteri - Carcinoma, AJCC 7th Edition - Clinical stage from 01/11/2010: FIGO Stage IIIC, calculated as Stage IIIC1 (T1b, N1, M0) - Signed by Derwood Kaplan, MD on 08/10/2021 Staged by: Managing physician Diagnostic confirmation: Positive histology Specimen type: Excision Histopathologic type: Adenocarcinoma, NOS Stage prefix: Initial diagnosis Histologic grade (G): G1 Lymph-vascular invasion (LVI): LVI not present (absent)/not identified Residual tumor (R): R0 -  None Peritoneal cytology results: Negative Pelvic nodal status: Positive Number of pelvic nodes positive from dissection: 2 Number of pelvic nodes examined during dissection: 20 Para-aortic status: Negative Prognostic indicators: 50% invasion of myometrium Stage used in treatment planning: Yes National guidelines used in treatment planning: Yes Type of national guideline used in treatment planning: NCCN Staging comments: Had carboplatin/taxol x 9 cycles   07/15/2013 Initial Diagnosis   Endometrial ca [facility])    Interval History Baneen comes in for yearly follow up and tells me her appetite is fine but she has lost 40 pounds. She remains oxygen dependent, on 4L. She had her labs done last week and we reviewed them in detail and I gave her a copy. I have recommended a CT scan and she is agreeable. Her potassium is only 3.2. I ordered potassium 10 meq BID and encouraged her to increase this in her diet. Her hemoglobin is 11.9 with an MCV of 97.7 but her ferritin is only  19 and her TIBC is 505 so I feel she is iron deficient. I will also recommend an increase in iron in her diet since she does not tolerate oral supplement, and we discussed what foods to include. Her B12 is low normal and will need to be rechecked next year. Her vitamin D level is pending and I can let her know when available.Her thyroid supplement may be too high but she tells me the dose was just decreased to 112 mcg 2 weeks ago so her PCP can follow up on this. She did have a fall last week.   HISTORY:   Allergies: No Known Allergies  Current Medications: Current Outpatient Medications  Medication Sig Dispense Refill   albuterol (VENTOLIN HFA) 108 (90 Base) MCG/ACT inhaler Inhale 2 puffs into the lungs every 6 (six) hours as needed for wheezing or shortness of breath.     ammonium lactate (LAC-HYDRIN) 12 % lotion Apply 1 application topically 3 (three) times a week.     apixaban (ELIQUIS) 5 MG TABS tablet Take 5 mg by mouth 2  (two) times daily.     aspirin 81 MG tablet Take 1 tablet (81 mg total) by mouth daily. 30 tablet    atorvastatin (LIPITOR) 40 MG tablet Take 40 mg by mouth daily.     benzonatate (TESSALON) 100 MG capsule Take 100 mg by mouth daily.     celecoxib (CELEBREX) 200 MG capsule Take 200 mg by mouth as needed for mild pain.     cloNIDine (CATAPRES) 0.1 MG tablet Take 0.1 mg by mouth 3 (three) times daily.     clotrimazole (MYCELEX) 10 MG troche Take 10 mg by mouth every 4 (four) hours as needed (thrush).     cyclobenzaprine (FLEXERIL) 10 MG tablet Take 10 mg by mouth at bedtime as needed for muscle spasms. May also take 3 x a week if needed.     digoxin (LANOXIN) 0.125 MG tablet TAKE 1 TABLET BY MOUTH EVERY DAY 90 tablet 3   diltiazem (CARDIZEM SR) 120 MG 12 hr capsule Take 1 capsule (120 mg total) by mouth 2 (two) times daily. 180 capsule 0   diphenhydrAMINE (BENADRYL) 25 mg capsule Take 25 mg by mouth as needed for allergies.     DULoxetine (CYMBALTA) 30 MG capsule Take 90 mg by mouth daily.     fexofenadine (ALLEGRA) 180 MG tablet Take 180 mg by mouth daily.     fluticasone (FLONASE) 50 MCG/ACT nasal spray Place 1 spray into both nostrils daily as needed for allergies or rhinitis.     fluticasone-salmeterol (ADVAIR) 250-50 MCG/ACT AEPB Inhale 1 puff into the lungs as needed (wheezing and shortness of breath).     gabapentin (NEURONTIN) 300 MG capsule Take 600-900 mg by mouth See admin instructions. 600 mg at 10a, 2p and 6p and 900 mg at bedtime     levothyroxine (SYNTHROID) 112 MCG tablet Take 112 mcg by mouth daily.     losartan (COZAAR) 25 MG tablet Take 25 mg by mouth 2 (two) times daily.     montelukast (SINGULAIR) 10 MG tablet Take 10 mg by mouth at bedtime.      nitroGLYCERIN (NITROSTAT) 0.4 MG SL tablet Place 0.4 mg under the tongue every 5 (five) minutes as needed for chest pain.     nystatin cream (MYCOSTATIN) Apply 1 application topically 3 (three) times daily as needed for dry skin.      omeprazole (PRILOSEC) 20 MG capsule Take 20 mg by mouth every evening.  potassium chloride (KLOR-CON M) 10 MEQ tablet Take 1 tablet (10 mEq total) by mouth 2 (two) times daily. 60 tablet 5   promethazine (PHENERGAN) 25 MG suppository Place 25 mg rectally every 6 (six) hours as needed for nausea or vomiting.     traMADol (ULTRAM) 50 MG tablet Take 50 mg by mouth every 6 (six) hours as needed for severe pain.     tretinoin (RETIN-A) 0.1 % cream Apply 1 application topically at bedtime.     Vitamin D, Ergocalciferol, (DRISDOL) 50000 UNITS CAPS capsule Take 50,000 Units by mouth every 7 (seven) days. Wednesday     No current facility-administered medications for this visit.    REVIEW OF SYSTEMS:  Review of Systems  Constitutional:  Positive for appetite change (poor), fatigue and unexpected weight change (weight loss, 30-40 lbs). Negative for chills and fever.  HENT:  Negative.    Eyes: Negative.   Respiratory:  Positive for shortness of breath (with exertion). Negative for chest tightness, cough, hemoptysis and wheezing.   Cardiovascular: Negative.  Negative for chest pain, leg swelling and palpitations.  Gastrointestinal:  Positive for constipation (with narrow and mahoghany colored stools). Negative for abdominal distention, abdominal pain, blood in stool, diarrhea, nausea, rectal pain and vomiting.  Endocrine: Negative.   Genitourinary: Negative.  Negative for difficulty urinating, dysuria, frequency and hematuria.   Musculoskeletal:  Negative for arthralgias, back pain, flank pain, gait problem and myalgias.  Skin: Negative.   Neurological:  Positive for extremity weakness. Negative for dizziness, gait problem, headaches, light-headedness, numbness, seizures and speech difficulty.  Hematological: Negative.   Psychiatric/Behavioral: Negative.  Negative for depression and sleep disturbance. The patient is not nervous/anxious.       VITALS:  Blood pressure 120/60, pulse 68, temperature  (!) 97.5 F (36.4 C), temperature source Oral, resp. rate 20, height '5\' 2"'$  (1.575 m), weight 98 lb 4.8 oz (44.6 kg), SpO2 92 %.  Wt Readings from Last 3 Encounters:  08/13/22 98 lb 4.8 oz (44.6 kg)  08/08/22 100 lb 1.6 oz (45.4 kg)  09/29/21 109 lb 3.2 oz (49.5 kg)    Body mass index is 17.98 kg/m.  Performance status (ECOG): 2 - Symptomatic, <50% confined to bed  PHYSICAL EXAM:  Physical Exam deferred due to TELEHEALTH visit   LABS:      Latest Ref Rng & Units 08/08/2022    4:05 PM 07/04/2021   12:00 AM 03/23/2021   12:00 AM  CBC  WBC 4.0 - 10.5 K/uL 5.9  7.1  8.0   Hemoglobin 12.0 - 15.0 g/dL 11.9  10.5  11.8   Hematocrit 36.0 - 46.0 % 38.2  29  37   Platelets 150 - 400 K/uL 290  257  319       Latest Ref Rng & Units 08/08/2022    4:05 PM 07/04/2021   12:00 AM 03/23/2021   12:00 AM  CMP  Glucose 70 - 99 mg/dL 125     BUN 8 - 23 mg/dL '14  24  14   '$ Creatinine 0.44 - 1.00 mg/dL 0.63  0.4  0.6   Sodium 135 - 145 mmol/L 142  137  139   Potassium 3.5 - 5.1 mmol/L 3.2  4.3  3.5   Chloride 98 - 111 mmol/L 102  102  100   CO2 22 - 32 mmol/L '29  30  28   '$ Calcium 8.9 - 10.3 mg/dL 9.2  8.9  9.0   Total Protein 6.5 - 8.1 g/dL 7.1  Total Bilirubin 0.3 - 1.2 mg/dL 0.9     Alkaline Phos 38 - 126 U/L 72  63  70   AST 15 - 41 U/L 24  25  33   ALT 0 - 44 U/L '18  14  19     '$ Lab Results  Component Value Date   CAN125 52.0 (H) 08/08/2022    No results found for: "TOTALPROTELP", "ALBUMINELP", "A1GS", "A2GS", "BETS", "BETA2SER", "GAMS", "MSPIKE", "SPEI" Lab Results  Component Value Date   TIBC 505 (H) 08/08/2022   FERRITIN 19 08/08/2022   IRONPCTSAT 14 08/08/2022   No results found for: "LDH"  STUDIES:  No results found.

## 2022-08-09 NOTE — Telephone Encounter (Signed)
-----   Message from Derwood Kaplan, MD sent at 08/08/2022  8:20 PM EST ----- Regarding: labs I ordered but can't find a dx to get the vitamin D level to pass.Has she ever had deficiency?Any ideas ----- Message ----- From: Georgette Shell, RN Sent: 08/08/2022   4:51 PM EST To: Derwood Kaplan, MD  Patient wants Vit D and T4 added to her labs today.  We drew extra vials for these if okay please put in the orders.  Thanks

## 2022-08-09 NOTE — Telephone Encounter (Signed)
Stacey Bowman is ordering Vitamin D.

## 2022-08-11 LAB — CA 125: Cancer Antigen (CA) 125: 52 U/mL — ABNORMAL HIGH (ref 0.0–38.1)

## 2022-08-11 LAB — T4: T4, Total: 15.5 ug/dL — ABNORMAL HIGH (ref 4.5–12.0)

## 2022-08-13 ENCOUNTER — Other Ambulatory Visit: Payer: Self-pay | Admitting: Oncology

## 2022-08-13 ENCOUNTER — Inpatient Hospital Stay (INDEPENDENT_AMBULATORY_CARE_PROVIDER_SITE_OTHER): Payer: Medicare Other | Admitting: Oncology

## 2022-08-13 ENCOUNTER — Encounter: Payer: Self-pay | Admitting: Oncology

## 2022-08-13 VITALS — BP 120/60 | HR 68 | Temp 97.5°F | Resp 20 | Ht 62.0 in | Wt 98.3 lb

## 2022-08-13 DIAGNOSIS — E559 Vitamin D deficiency, unspecified: Secondary | ICD-10-CM

## 2022-08-13 DIAGNOSIS — C541 Malignant neoplasm of endometrium: Secondary | ICD-10-CM | POA: Diagnosis not present

## 2022-08-13 DIAGNOSIS — E876 Hypokalemia: Secondary | ICD-10-CM | POA: Insufficient documentation

## 2022-08-13 DIAGNOSIS — E039 Hypothyroidism, unspecified: Secondary | ICD-10-CM

## 2022-08-13 MED ORDER — POTASSIUM CHLORIDE CRYS ER 10 MEQ PO TBCR: 10.0000 meq | EXTENDED_RELEASE_TABLET | Freq: Two times a day (BID) | ORAL | 5 refills | Status: DC

## 2022-08-15 LAB — VITAMIN D 1,25 DIHYDROXY
Vitamin D 1, 25 (OH)2 Total: 77 pg/mL — ABNORMAL HIGH
Vitamin D2 1, 25 (OH)2: 71 pg/mL
Vitamin D3 1, 25 (OH)2: <10 pg/mL

## 2022-08-17 ENCOUNTER — Telehealth: Payer: Self-pay

## 2022-08-22 ENCOUNTER — Telehealth: Payer: Self-pay

## 2022-08-22 ENCOUNTER — Encounter: Payer: Self-pay | Admitting: Oncology

## 2022-08-22 NOTE — Telephone Encounter (Signed)
Attempted to contact patient. No answer and no VM.  

## 2022-08-22 NOTE — Telephone Encounter (Signed)
-----   Message from Derwood Kaplan, MD sent at 08/16/2022  7:00 PM EST ----- Regarding: call Tell her vitamin D level is good at 77

## 2022-09-11 ENCOUNTER — Encounter: Payer: Self-pay | Admitting: Oncology

## 2022-09-17 ENCOUNTER — Other Ambulatory Visit: Payer: Self-pay | Admitting: Cardiology

## 2022-09-25 IMAGING — CT CT ANGIO HEAD
1 of 4 series · 6 of 30 positions shown · IV contrast (APPLIED)
Comparison: Carotid Doppler ultrasound 03/31/2019.

[place] CTA neck 11/17/2015, Head CT 03/10/2019.

CLINICAL DATA: 80-year-old female with carotid Doppler evidence of
severe right ICA stenosis in 9696. History of right eye vision
changes.

EXAM:
CT ANGIOGRAPHY HEAD AND NECK
TECHNIQUE: Multidetector CT imaging of the head and neck was performed using
the standard protocol during bolus administration of intravenous
contrast. Multiplanar CT image reconstructions and MIPs were
obtained to evaluate the vascular anatomy. Carotid stenosis
measurements (when applicable) are obtained utilizing NASCET
criteria, using the distal internal carotid diameter as the
denominator.
CONTRAST:  75mL WVJEAJ-J2G IOPAMIDOL (WVJEAJ-J2G) INJECTION 76%

[Series 7: head/neck angio · axial · 0.43mm/px · z∈[-266,+10]mm · 6 of 194 slices shown]
[im 28/194  brain]
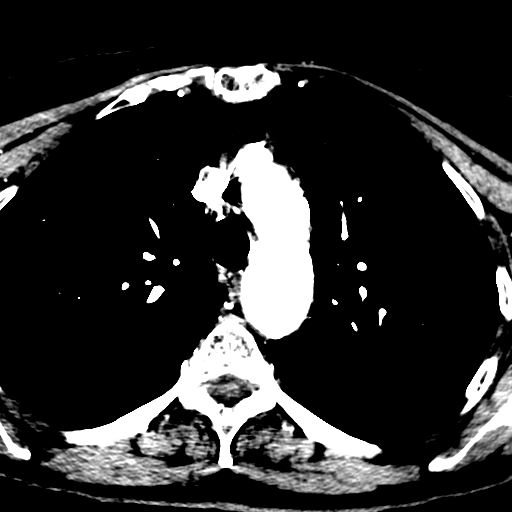
[im 56/194  bone]
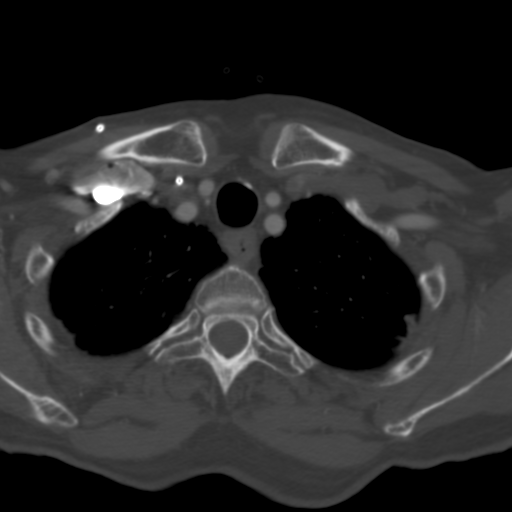
[im 83/194  brain]
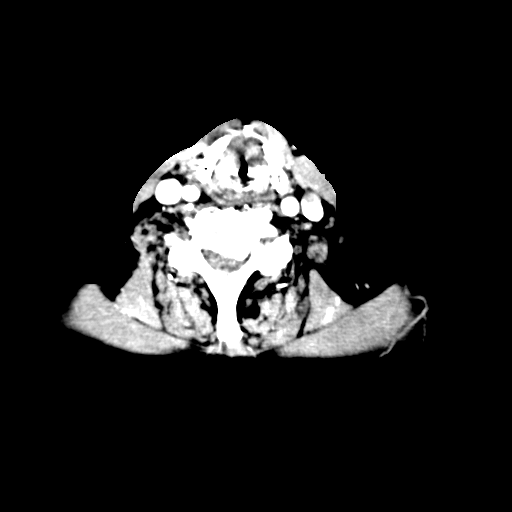
[im 111/194  bone]
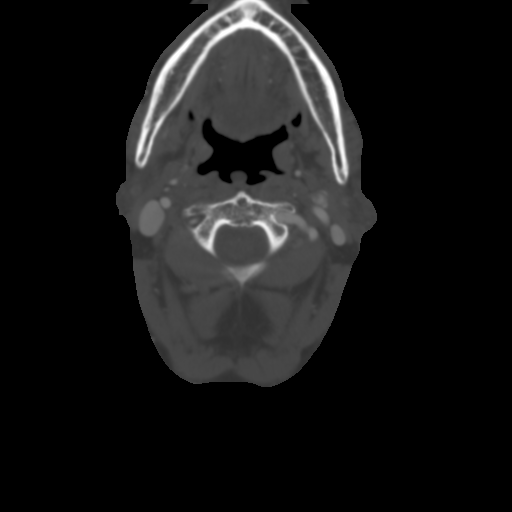
[im 138/194  brain]
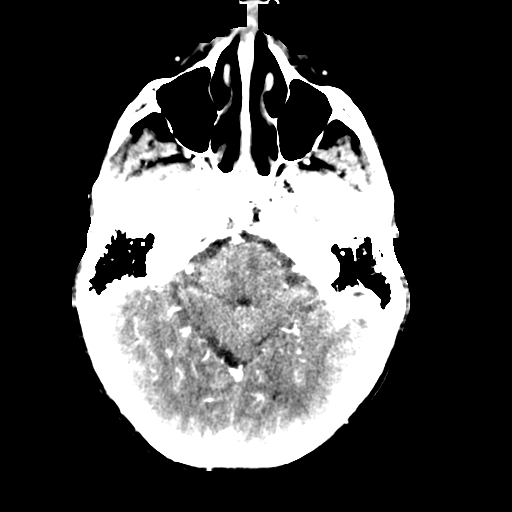
[im 166/194  bone]
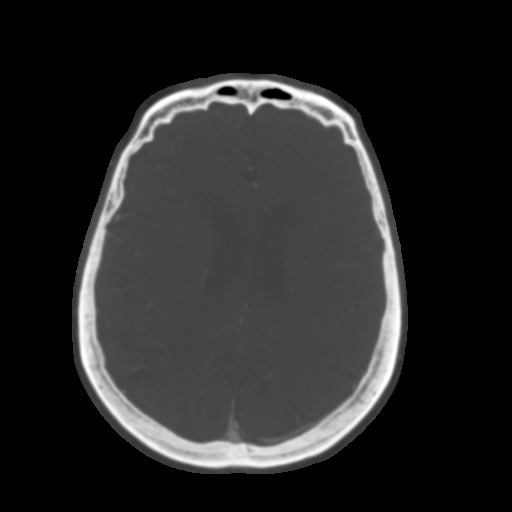

[6 of 30 positions shown; findings below may reference images not displayed]

FINDINGS: CT HEAD

Brain: Chronic appearing encephalomalacia in the right parietal lobe
including the sensory strip (series 4, image 24) is new since 9696.
Otherwise patchy bilateral white matter hypodensity appears stable.

No midline shift, ventriculomegaly, mass effect, evidence of mass
lesion, intracranial hemorrhage or evidence of cortically based
acute infarction. No other cortical encephalomalacia identified.

Calvarium and skull base: No acute osseous abnormality identified.

Paranasal sinuses: Chronic right sphenoid sinusitis, improved since
9696. Other Visualized paranasal sinuses and mastoids are stable and
well aerated.

Orbits: No acute orbit or scalp soft tissue findings.

CTA NECK

Skeleton: Carious dentition. Progressive C5-C6 cervical vertebral
ankylosis since 1724. Underlying multilevel chronic cervical spine
degeneration. No acute osseous abnormality identified.

Upper chest: Severe emphysema with progression since 1724. Partially
visible bullous changes along the right minor fissure. No superior
mediastinal lymphadenopathy. Chronic right chest Port-A-Cath is
stable.

Other neck: Motion artifact at the glottis. Neck soft tissues
appears stable and within normal limits.

Aortic arch: 3 vessel arch configuration. Moderate chronic Calcified
aortic atherosclerosis.

Right carotid system: Brachiocephalic artery calcified plaque
without stenosis. Negative right CCA origin. Circumferential soft
plaque with occasional tiny ulceration in the right CCA (series 7,
image 120) at the level of the thyroid. Calcified plaque begins in
the vessel at the level of the hypopharynx and progresses to the
right carotid bifurcation where chronic complex soft and calcified
plaque now results in radiographic string sign stenosis (series 7,
image 96) over a segment of about 6 mm at the right ICA origin
(series 12, image 73). Despite this the right ICA remains patent
with stable mild tortuosity to the skull base.

Left carotid system: Minimal plaque at the left CCA origin. Mild
soft and calcified plaque in the medial vessel proximal to the
bifurcation. Increased but still mild soft and calcified plaque at
the left ICA origin and bulb not resulting in stenosis.

Vertebral arteries:
Stable plaque in the proximal left subclavian artery without
stenosis. Dominant left vertebral artery origin remains normal.
Tortuous left V1 segment. Tortuous left V2 segment. The left
vertebral is patent to the skull base without stenosis.

Increased calcified plaque at the right subclavian artery origin but
less than 50% stenosis. Right vertebral artery origin occurs early
and remains normal. Non dominant right vertebral artery has a late
entry into the cervical transverse foramen and is patent to the
skull base without stenosis.

CTA HEAD

Posterior circulation: Dominant left vertebral V4 segment with mild
calcified plaque, no stenosis. The non dominant right vertebral
remains patent to the vertebrobasilar junction. Both PICA origins
are normal. Patent basilar artery without stenosis. Normal SCA and
PCA origins. Fetal right PCA origin. Left posterior communicating
artery is diminutive or absent. Bilateral PCA branches are patent
with mild irregularity and tortuosity.

Anterior circulation: Both ICA siphons are patent. There is moderate
left siphon calcified plaque but underlying siphon ectasia with no
significant stenosis. And similar findings on the right side. Normal
right posterior communicating artery origin. Patent carotid termini.
Patent MCA and ACA origins. Dominant left A1. Normal anterior
communicating artery. Bilateral ACA branches are within normal
limits. Left MCA M1 segment and bifurcation are patent without
stenosis. Left MCA branches are within normal limits. Right MCA M1
segment and bifurcation are patent without stenosis. Right MCA
branches are within normal limits.

Venous sinuses: Patent.

Anatomic variants: Dominant left vertebral artery. Fetal right PCA
origin. Dominant left ACA A1.

Review of the MIP images confirms the above findings
IMPRESSION: 1. Positive for progressive complex soft and calcified plaque of the
Right carotid since 1724 now with High-grade
RADIOGRAPHIC-STRING-SIGN stenosis of the Right ICA over a segment of
about 6 mm. The vessel remains patent.

2. Comparatively mild for age atherosclerosis in the head and neck
elsewhere. Increased left ICA origin plaque since 1724 but no
significant stenosis. Mild generalized vessel tortuosity in the head
and neck.

3. Chronic infarct in the posterior Right MCA territory with
encephalomalacia is new since 9696. But no acute intracranial
abnormality is identified.

4. Progressed, severe Emphysema (OX2JE-L4C.J). Aortic
Atherosclerosis (OX2JE-9HJ.J).

## 2022-11-21 ENCOUNTER — Other Ambulatory Visit: Payer: Self-pay | Admitting: Cardiology

## 2022-12-19 ENCOUNTER — Other Ambulatory Visit: Payer: Self-pay | Admitting: Cardiology

## 2022-12-23 ENCOUNTER — Other Ambulatory Visit: Payer: Self-pay | Admitting: Cardiology

## 2023-01-28 ENCOUNTER — Other Ambulatory Visit: Payer: Self-pay | Admitting: Cardiology

## 2023-03-12 ENCOUNTER — Other Ambulatory Visit: Payer: Self-pay | Admitting: Oncology

## 2023-03-12 DIAGNOSIS — C541 Malignant neoplasm of endometrium: Secondary | ICD-10-CM

## 2023-03-12 DIAGNOSIS — R911 Solitary pulmonary nodule: Secondary | ICD-10-CM

## 2023-03-26 ENCOUNTER — Telehealth: Payer: Self-pay | Admitting: Oncology

## 2023-03-26 NOTE — Telephone Encounter (Signed)
03/26/23 Left message with caregiver.All CT SCANS rescheduled to 04/05/23 arrive at 130pm.

## 2023-04-05 ENCOUNTER — Inpatient Hospital Stay: Payer: Medicare Other | Attending: Oncology

## 2023-04-05 LAB — TSH: TSH: 0.02 — AB (ref 0.41–5.90)

## 2023-04-05 LAB — BASIC METABOLIC PANEL
BUN: 10 (ref 4–21)
CO2: 26 — AB (ref 13–22)
Chloride: 98 — AB (ref 99–108)
Creatinine: 0.5 (ref 0.5–1.1)
Glucose: 108
Potassium: 3.7 mEq/L (ref 3.5–5.1)
Sodium: 136 — AB (ref 137–147)

## 2023-04-05 LAB — COMPREHENSIVE METABOLIC PANEL
Albumin: 3.6 (ref 3.5–5.0)
Calcium: 8.6 — AB (ref 8.7–10.7)

## 2023-04-05 LAB — IRON,TIBC AND FERRITIN PANEL
%SAT: 17.3
Ferritin: 27.5
Iron: 61
TIBC: 351

## 2023-04-05 LAB — LAB REPORT - SCANNED
A1c: 5.3
EGFR: 60

## 2023-04-05 LAB — LIPID PANEL
Cholesterol: 86 (ref 0–200)
HDL: 54 (ref 35–70)
LDL Cholesterol: 21
LDl/HDL Ratio: 1.6
Triglycerides: 54 (ref 40–160)

## 2023-04-05 LAB — CBC: RBC: 3.78 — AB (ref 3.87–5.11)

## 2023-04-05 LAB — HEMOGLOBIN A1C: Hemoglobin A1C: 5.3

## 2023-04-05 LAB — CBC AND DIFFERENTIAL
HCT: 35 — AB (ref 36–46)
Hemoglobin: 11.6 — AB (ref 12.0–16.0)
Neutrophils Absolute: 4.79
Platelets: 267 10*3/uL (ref 150–400)
WBC: 7.6

## 2023-04-05 LAB — HEPATIC FUNCTION PANEL
ALT: 18 U/L (ref 7–35)
AST: 29 (ref 13–35)
Alkaline Phosphatase: 60 (ref 25–125)
Bilirubin, Total: 1.1

## 2023-04-05 LAB — VITAMIN B12: Vitamin B-12: 236

## 2023-04-05 LAB — POCT INR: INR: 1.1 (ref 0.80–1.20)

## 2023-04-05 LAB — PROTIME-INR: Protime: 12 (ref 10.0–13.8)

## 2023-04-18 ENCOUNTER — Encounter: Payer: Self-pay | Admitting: Oncology

## 2023-05-15 ENCOUNTER — Encounter: Payer: Self-pay | Admitting: Oncology

## 2023-05-28 ENCOUNTER — Inpatient Hospital Stay: Payer: Medicare Other | Attending: Oncology

## 2023-08-08 ENCOUNTER — Inpatient Hospital Stay: Payer: Medicare Other | Attending: Oncology

## 2023-08-08 ENCOUNTER — Other Ambulatory Visit: Payer: Medicare Other

## 2023-08-14 ENCOUNTER — Inpatient Hospital Stay: Payer: Medicare Other | Admitting: Hematology and Oncology

## 2023-08-14 NOTE — Progress Notes (Incomplete)
[facility]  [address] [place],  [place]  [address] [phone]  Clinic Day:  08/14/2023  Referring physician: No ref. provider found  ASSESSMENT & PLAN:   Assessment & Plan: No problem-specific Assessment & Plan notes found for this encounter. 1. 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  She still has fluctuation of her CA 125, but it has been even higher than this in the past. It is up to 52 this week.   2. Hypokalemia. I will order 10 meq to take po BID and encourage her to increase potassium in her diet.   3. Bilateral hip replacements.  The interference from this makes the CT of the pelvis very difficult to interpret.   4. Anemia, mild and normochromic and normocytic.  I suspect this is iron deficiency with a ferritin of 19 and high TIBC of 505. Her B12 level is low normal.   5. Stable pulmonary nodule, 4 mm, but a new one measuring 1.3 cm last year.     6. 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     The patient understands the plans discussed today and is in agreement with them.  She knows to contact our office if she develops concerns prior to her next appointment.   I provided *** minutes of face-to-face time during this encounter and > 50% was spent counseling as documented under my assessment and plan.    Stacey Bowman  [facility] - A DEPT OF [facility] [address] [place] [address] Dept: [phone] Dept Fax: [phone]   No orders of the defined types were placed in this encounter.    CHIEF COMPLAINT:  CC: History of stage IIIC endometrial cancer and iron deficiency   Current Treatment:  Surveillance   HISTORY OF PRESENT ILLNESS:   Oncology History  Endometrial ca [facility])  01/11/2010 Cancer Staging   Staging form: Corpus Uteri - Carcinoma, AJCC  7th Edition - Clinical stage from 01/11/2010: FIGO Stage IIIC, calculated as Stage IIIC1 (T1b, N1, M0) - Signed by Dellia Beckwith, MD on 08/10/2021 Staged by: Managing physician Diagnostic confirmation: Positive histology Specimen type: Excision Histopathologic type: Adenocarcinoma, NOS Stage prefix: Initial diagnosis Histologic grade (G): G1 Lymph-vascular invasion (LVI): LVI not present (absent)/not identified Residual tumor (R): R0 - None Peritoneal cytology results: Negative Pelvic nodal status: Positive Number of pelvic nodes positive from dissection: 2 Number of pelvic nodes examined during dissection: 20 Para-aortic status: Negative Prognostic indicators: 50% invasion of myometrium Stage used in treatment planning: Yes National guidelines used in treatment planning: Yes Type of national guideline used in treatment planning: NCCN Staging comments: Had carboplatin/taxol x 9 cycles   07/15/2013 Initial Diagnosis   Endometrial ca [facility])       INTERVAL HISTORY:  Stacey Bowman is here today for repeat clinical assessment.    She denies fevers or chills. She denies pain. Her appetite is good. Her weight {Weight change:10426}.  REVIEW OF SYSTEMS:  Review of Systems - Oncology   VITALS:  There were no vitals taken for this visit.  Wt Readings from Last 3 Encounters:  08/13/22 98 lb 4.8 oz (44.6 kg)  08/08/22 100 lb 1.6 oz (45.4 kg)  09/29/21 109 lb 3.2 oz (49.5 kg)    There is no height or weight on file to calculate BMI.  Performance status (ECOG): {  CHL ONC ZH:[id number]}  PHYSICAL EXAM:  Physical Exam  LABS:      Latest Ref Rng & Units 04/05/2023   12:00 AM 08/08/2022    4:05 PM 07/04/2021   12:00 AM  CBC  WBC  7.6     5.9  7.1   Hemoglobin 12.0 - 16.0 11.6     11.9  10.5   Hematocrit 36 - 46 35     38.2  29   Platelets 150 - 400 K/uL 267     290  257      This result is from an external source.      Latest Ref Rng & Units 04/05/2023   12:00 AM 08/08/2022     4:05 PM 07/04/2021   12:00 AM  CMP  Glucose 70 - 99 mg/dL  295    BUN 4 - 21 10     14  24    Creatinine 0.5 - 1.1 0.5     0.63  0.4   Sodium 137 - 147 136     142  137   Potassium 3.5 - 5.1 mEq/L 3.7     3.2  4.3   Chloride 99 - 108 98     102  102   CO2 13 - 22 26     29  30    Calcium 8.7 - 10.7 8.6     9.2  8.9   Total Protein 6.5 - 8.1 g/dL  7.1    Total Bilirubin 0.3 - 1.2 mg/dL  0.9    Alkaline Phos 25 - 125 60     72  63   AST 13 - 35 29     24  25    ALT 7 - 35 U/L 18     18  14       This result is from an external source.     No results found for: "CEA1", "CEA" / No results found for: "CEA1", "CEA" No results found for: "PSA1" No results found for: "MWU132" Lab Results  Component Value Date   CAN125 52.0 (H) 08/08/2022    No results found for: "TOTALPROTELP", "ALBUMINELP", "A1GS", "A2GS", "BETS", "BETA2SER", "GAMS", "MSPIKE", "SPEI" Lab Results  Component Value Date   TIBC 351 04/05/2023   TIBC 505 (H) 08/08/2022   FERRITIN 27.5 04/05/2023   FERRITIN 19 08/08/2022   IRONPCTSAT 17.3 04/05/2023   IRONPCTSAT 14 08/08/2022   No results found for: "LDH"  STUDIES:  No results found.    HISTORY:   Past Medical History:  Diagnosis Date   ADD (attention deficit disorder)    takes Ritalin daily   Anemia    iron deficiency   Arthralgia 04/14/2018   Back pain    stenosis   Chronic bronchitis    was on Dexamethasone and has been off for surgery   DDD (degenerative disc disease)    Depression    takes Prozac daily   Diverticulosis    2006 colonoscopy   DJD (degenerative joint disease)    Dysrhythmia    Rt BBB and Tacycardia   Elevated tumor markers 08/10/2021   Emphysema    uses Advair inhaler bid and Albuterol prn   Emphysema    Endometrial adenocarcinoma (HCC) 2012   GERD (gastroesophageal reflux disease)    takes Omeprazole daily   Headache(784.0)    migraines stopped with menopause   History of blood transfusion    History of central retinal artery  occlusion 05/02/2016   History of colon polyps  History of migraine    History of staph infection 2013   HTN (hypertension) 05/02/2016   Iron deficiency anemia 07/15/2013   Joint pain    Joint swelling    Lyme disease 2013   Muscle spasms of lower extremity    takes Flexeril daily as needed   Neuropathy    OCD (obsessive compulsive disorder)    On home oxygen therapy    Osteoporosis    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HCC)    was taking steroids-came off in May 2014   Pulmonary nodule less than 1 cm in diameter with moderate to high risk for malignant neoplasm 08/11/2015   Up to 1.3 cm in July 2022   Short-term memory loss    Shortness of breath    with exertion   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   Unspecified essential hypertension    takes Ramipril,Catapres,and Metoprolol daily   Urinary frequency    Urinary urgency    UTI (lower urinary tract infection) hx of   was on a Sulfa med-placed on it 08-05-13 as precaution for surgery    Past Surgical History:  Procedure Laterality Date   ABDOMINAL HYSTERECTOMY     CHOLECYSTECTOMY     COLONOSCOPY     JOINT REPLACEMENT Left 08/19/13   hip   Porta catheter Right 2011   right arm surgery     fracure repair   right arm surgery     TOTAL ABDOMINAL HYSTERECTOMY W/ BILATERAL SALPINGOOPHORECTOMY     TOTAL HIP ARTHROPLASTY Left 08/18/2013   Procedure: LEFT TOTAL HIP ARTHROPLASTY ANTERIOR APPROACH;  Surgeon: Kathryne Hitch, MD;  Location: [facility] OR;  Service: Orthopedics;  Laterality: Left;   TOTAL HIP ARTHROPLASTY Right 10/06/2013   DR Magnus Ivan   TOTAL HIP ARTHROPLASTY Right 10/06/2013   Procedure: RIGHT TOTAL HIP ARTHROPLASTY ANTERIOR APPROACH;  Surgeon: Kathryne Hitch, MD;  Location: [facility] OR;  Service: Orthopedics;  Laterality: Right;   WRIST SURGERY Right    ligament repair    Family History  Problem Relation Age of Onset   Cervical cancer Maternal Aunt    Emphysema Mother    Heart disease Mother     Diabetes Father    Diabetes Maternal Grandmother    Heart disease Maternal Grandmother    Lung disease Brother     Social History:  reports that she quit smoking about 27 years ago. Her smoking use included cigarettes. She has never used smokeless tobacco. She reports that she does not drink alcohol and does not use drugs.The patient is {Blank single:19197::"alone","accompanied by"} *** today.  Allergies: No Known Allergies  Current Medications: Current Outpatient Medications  Medication Sig Dispense Refill   albuterol (VENTOLIN HFA) 108 (90 Base) MCG/ACT inhaler Inhale 2 puffs into the lungs every 6 (six) hours as needed for wheezing or shortness of breath.     ammonium lactate (LAC-HYDRIN) 12 % lotion Apply 1 application topically 3 (three) times a week.     apixaban (ELIQUIS) 5 MG TABS tablet Take 5 mg by mouth 2 (two) times daily.     aspirin 81 MG tablet Take 1 tablet (81 mg total) by mouth daily. 30 tablet    atorvastatin (LIPITOR) 40 MG tablet Take 40 mg by mouth daily.     benzonatate (TESSALON) 100 MG capsule Take 100 mg by mouth daily.     celecoxib (CELEBREX) 200 MG capsule Take 200 mg by mouth as needed for mild pain.     cloNIDine (CATAPRES) 0.1 MG tablet Take  0.1 mg by mouth 3 (three) times daily.     clotrimazole (MYCELEX) 10 MG troche Take 10 mg by mouth every 4 (four) hours as needed (thrush).     cyclobenzaprine (FLEXERIL) 10 MG tablet Take 10 mg by mouth at bedtime as needed for muscle spasms. May also take 3 x a week if needed.     digoxin (LANOXIN) 0.125 MG tablet Take 1 tablet (0.125 mg total) by mouth daily. Patient needs an appointment for further refills. 2 nd attempt 15 tablet 0   diltiazem (CARDIZEM SR) 120 MG 12 hr capsule Take 1 capsule (120 mg total) by mouth 2 (two) times daily. Patient needs appointment for further refills. 1 st attempt 60 capsule 0   diphenhydrAMINE (BENADRYL) 25 mg capsule Take 25 mg by mouth as needed for allergies.     DULoxetine  (CYMBALTA) 30 MG capsule Take 90 mg by mouth daily.     fexofenadine (ALLEGRA) 180 MG tablet Take 180 mg by mouth daily.     fluticasone (FLONASE) 50 MCG/ACT nasal spray Place 1 spray into both nostrils daily as needed for allergies or rhinitis.     fluticasone-salmeterol (ADVAIR) 250-50 MCG/ACT AEPB Inhale 1 puff into the lungs as needed (wheezing and shortness of breath).     gabapentin (NEURONTIN) 300 MG capsule Take 600-900 mg by mouth See admin instructions. 600 mg at 10a, 2p and 6p and 900 mg at bedtime     levothyroxine (SYNTHROID) 112 MCG tablet Take 112 mcg by mouth daily.     losartan (COZAAR) 25 MG tablet Take 25 mg by mouth 2 (two) times daily.     montelukast (SINGULAIR) 10 MG tablet Take 10 mg by mouth at bedtime.      nitroGLYCERIN (NITROSTAT) 0.4 MG SL tablet Place 0.4 mg under the tongue every 5 (five) minutes as needed for chest pain.     nystatin cream (MYCOSTATIN) Apply 1 application topically 3 (three) times daily as needed for dry skin.     omeprazole (PRILOSEC) 20 MG capsule Take 20 mg by mouth every evening.     potassium chloride (KLOR-CON M) 10 MEQ tablet Take 1 tablet (10 mEq total) by mouth 2 (two) times daily. 60 tablet 5   promethazine (PHENERGAN) 25 MG suppository Place 25 mg rectally every 6 (six) hours as needed for nausea or vomiting.     traMADol (ULTRAM) 50 MG tablet Take 50 mg by mouth every 6 (six) hours as needed for severe pain.     tretinoin (RETIN-A) 0.1 % cream Apply 1 application topically at bedtime.     Vitamin D, Ergocalciferol, (DRISDOL) 50000 UNITS CAPS capsule Take 50,000 Units by mouth every 7 (seven) days. Wednesday     No current facility-administered medications for this visit.    I,Stacey Bowman,acting as a Neurosurgeon for [employer], PA-C.,have documented all relevant documentation on the behalf of Stacey Perl, PA-C,as directed by  Stacey Perl, PA-C while in the presence of [employer], PA-C.

## 2023-09-10 ENCOUNTER — Other Ambulatory Visit: Payer: Self-pay | Admitting: Family Medicine

## 2023-10-31 ENCOUNTER — Encounter: Payer: Self-pay | Admitting: Oncology

## 2023-11-07 ENCOUNTER — Ambulatory Visit: Payer: Medicare Other | Admitting: Orthopaedic Surgery

## 2023-11-20 DIAGNOSIS — I371 Nonrheumatic pulmonary valve insufficiency: Secondary | ICD-10-CM

## 2023-11-20 DIAGNOSIS — I351 Nonrheumatic aortic (valve) insufficiency: Secondary | ICD-10-CM

## 2023-11-20 DIAGNOSIS — I361 Nonrheumatic tricuspid (valve) insufficiency: Secondary | ICD-10-CM | POA: Diagnosis not present

## 2023-11-21 ENCOUNTER — Encounter: Payer: Self-pay | Admitting: Oncology

## 2023-11-25 ENCOUNTER — Ambulatory Visit: Admitting: Physician Assistant

## 2024-01-02 DEATH — deceased
